# Patient Record
Sex: Female | Born: 1935 | Race: White | Hispanic: No | Marital: Married | Smoking: Former smoker
Health system: Southern US, Community
[De-identification: ages and names within clinical notes are randomized; demographics above are authoritative.]

## PROBLEM LIST (undated history)

## (undated) DIAGNOSIS — E785 Hyperlipidemia, unspecified: Secondary | ICD-10-CM

## (undated) DIAGNOSIS — I2510 Atherosclerotic heart disease of native coronary artery without angina pectoris: Secondary | ICD-10-CM

## (undated) DIAGNOSIS — E119 Type 2 diabetes mellitus without complications: Secondary | ICD-10-CM

## (undated) DIAGNOSIS — I890 Lymphedema, not elsewhere classified: Secondary | ICD-10-CM

## (undated) DIAGNOSIS — J449 Chronic obstructive pulmonary disease, unspecified: Secondary | ICD-10-CM

## (undated) DIAGNOSIS — I509 Heart failure, unspecified: Secondary | ICD-10-CM

## (undated) DIAGNOSIS — C9190 Lymphoid leukemia, unspecified not having achieved remission: Secondary | ICD-10-CM

## (undated) DIAGNOSIS — H409 Unspecified glaucoma: Secondary | ICD-10-CM

## (undated) DIAGNOSIS — I10 Essential (primary) hypertension: Secondary | ICD-10-CM

## (undated) DIAGNOSIS — N189 Chronic kidney disease, unspecified: Secondary | ICD-10-CM

## (undated) DIAGNOSIS — C9110 Chronic lymphocytic leukemia of B-cell type not having achieved remission: Secondary | ICD-10-CM

## (undated) HISTORY — DX: Hyperlipidemia, unspecified: E78.5

## (undated) HISTORY — DX: Chronic kidney disease, unspecified: N18.9

## (undated) HISTORY — PX: ABDOMINAL HYSTERECTOMY: SUR658

## (undated) HISTORY — DX: Chronic lymphocytic leukemia of B-cell type not having achieved remission: C91.10

## (undated) HISTORY — DX: Lymphedema, not elsewhere classified: I89.0

## (undated) HISTORY — PX: CHOLECYSTECTOMY: SHX55

## (undated) HISTORY — DX: Unspecified glaucoma: H40.9

## (undated) HISTORY — DX: Atherosclerotic heart disease of native coronary artery without angina pectoris: I25.10

## (undated) HISTORY — DX: Essential (primary) hypertension: I10

## (undated) HISTORY — PX: APPENDECTOMY: SHX54

---

## 2006-04-25 ENCOUNTER — Ambulatory Visit: Payer: Self-pay | Admitting: Gastroenterology

## 2006-04-29 ENCOUNTER — Ambulatory Visit: Payer: Self-pay | Admitting: Internal Medicine

## 2006-05-08 ENCOUNTER — Ambulatory Visit: Payer: Self-pay | Admitting: Internal Medicine

## 2006-09-22 ENCOUNTER — Ambulatory Visit: Payer: Self-pay | Admitting: Internal Medicine

## 2006-10-08 ENCOUNTER — Ambulatory Visit: Payer: Self-pay | Admitting: Internal Medicine

## 2007-01-13 ENCOUNTER — Ambulatory Visit: Payer: Self-pay | Admitting: Internal Medicine

## 2007-02-07 ENCOUNTER — Ambulatory Visit: Payer: Self-pay | Admitting: Internal Medicine

## 2007-05-11 ENCOUNTER — Ambulatory Visit: Payer: Self-pay | Admitting: Internal Medicine

## 2007-06-09 ENCOUNTER — Ambulatory Visit: Payer: Self-pay | Admitting: Internal Medicine

## 2007-08-11 ENCOUNTER — Ambulatory Visit: Payer: Self-pay | Admitting: Internal Medicine

## 2007-08-11 ENCOUNTER — Ambulatory Visit: Payer: Self-pay | Admitting: Family Medicine

## 2007-08-18 ENCOUNTER — Ambulatory Visit: Payer: Self-pay | Admitting: Family Medicine

## 2007-09-09 ENCOUNTER — Ambulatory Visit: Payer: Self-pay | Admitting: Internal Medicine

## 2007-11-09 ENCOUNTER — Ambulatory Visit: Payer: Self-pay | Admitting: Internal Medicine

## 2007-12-10 ENCOUNTER — Ambulatory Visit: Payer: Self-pay | Admitting: Internal Medicine

## 2008-01-02 ENCOUNTER — Ambulatory Visit: Payer: Self-pay | Admitting: Family Medicine

## 2008-01-04 ENCOUNTER — Ambulatory Visit: Payer: Self-pay | Admitting: Family Medicine

## 2008-01-07 ENCOUNTER — Ambulatory Visit: Payer: Self-pay | Admitting: Internal Medicine

## 2008-01-08 ENCOUNTER — Ambulatory Visit: Payer: Self-pay | Admitting: Internal Medicine

## 2008-02-07 ENCOUNTER — Ambulatory Visit: Payer: Self-pay | Admitting: Internal Medicine

## 2008-05-08 ENCOUNTER — Ambulatory Visit: Payer: Self-pay | Admitting: Internal Medicine

## 2008-06-08 ENCOUNTER — Ambulatory Visit: Payer: Self-pay | Admitting: Internal Medicine

## 2008-07-09 ENCOUNTER — Ambulatory Visit: Payer: Self-pay | Admitting: Internal Medicine

## 2009-01-06 ENCOUNTER — Ambulatory Visit: Payer: Self-pay | Admitting: Internal Medicine

## 2009-01-08 ENCOUNTER — Ambulatory Visit: Payer: Self-pay | Admitting: Family Medicine

## 2009-02-06 ENCOUNTER — Ambulatory Visit: Payer: Self-pay | Admitting: Internal Medicine

## 2009-07-07 ENCOUNTER — Ambulatory Visit: Payer: Self-pay | Admitting: Internal Medicine

## 2009-07-09 ENCOUNTER — Ambulatory Visit: Payer: Self-pay | Admitting: Internal Medicine

## 2010-01-06 ENCOUNTER — Ambulatory Visit: Payer: Self-pay | Admitting: Internal Medicine

## 2010-01-12 ENCOUNTER — Ambulatory Visit: Payer: Self-pay | Admitting: Family Medicine

## 2010-02-06 ENCOUNTER — Ambulatory Visit: Payer: Self-pay | Admitting: Internal Medicine

## 2011-01-04 ENCOUNTER — Ambulatory Visit: Payer: Self-pay | Admitting: Internal Medicine

## 2011-01-07 ENCOUNTER — Ambulatory Visit: Payer: Self-pay | Admitting: Internal Medicine

## 2011-01-25 ENCOUNTER — Ambulatory Visit: Payer: Self-pay | Admitting: Family Medicine

## 2011-02-22 ENCOUNTER — Ambulatory Visit: Payer: Self-pay | Admitting: Internal Medicine

## 2011-05-17 ENCOUNTER — Inpatient Hospital Stay: Payer: Self-pay | Admitting: Internal Medicine

## 2011-07-14 ENCOUNTER — Ambulatory Visit: Payer: Self-pay | Admitting: Internal Medicine

## 2011-08-09 ENCOUNTER — Ambulatory Visit: Payer: Self-pay | Admitting: Internal Medicine

## 2012-01-05 ENCOUNTER — Ambulatory Visit: Payer: Self-pay | Admitting: Internal Medicine

## 2012-01-05 LAB — CBC CANCER CENTER
Basophil %: 0.4 %
Eosinophil #: 0.5 x10 3/mm (ref 0.0–0.7)
Eosinophil %: 1.3 %
MCH: 30.3 pg (ref 26.0–34.0)
MCV: 91 fL (ref 80–100)
Monocyte #: 1.2 x10 3/mm — ABNORMAL HIGH (ref 0.0–0.7)
Neutrophil #: 8.4 x10 3/mm — ABNORMAL HIGH (ref 1.4–6.5)
Neutrophil %: 22.8 %
Platelet: 122 x10 3/mm — ABNORMAL LOW (ref 150–440)
RDW: 16.9 % — ABNORMAL HIGH (ref 11.5–14.5)

## 2012-01-05 LAB — CREATININE, SERUM: EGFR (African American): 33 — ABNORMAL LOW

## 2012-01-07 ENCOUNTER — Ambulatory Visit: Payer: Self-pay | Admitting: Internal Medicine

## 2012-02-02 ENCOUNTER — Ambulatory Visit: Payer: Self-pay | Admitting: Family Medicine

## 2012-07-04 ENCOUNTER — Ambulatory Visit: Payer: Self-pay | Admitting: Internal Medicine

## 2012-07-04 LAB — CBC CANCER CENTER
Basophil %: 0.4 %
HCT: 34.9 % — ABNORMAL LOW (ref 35.0–47.0)
HGB: 11.1 g/dL — ABNORMAL LOW (ref 12.0–16.0)
Lymphocyte %: 72.1 %
MCH: 29.3 pg (ref 26.0–34.0)
MCHC: 31.9 g/dL — ABNORMAL LOW (ref 32.0–36.0)
MCV: 92 fL (ref 80–100)
Neutrophil #: 5.4 x10 3/mm (ref 1.4–6.5)

## 2012-07-09 ENCOUNTER — Ambulatory Visit: Payer: Self-pay | Admitting: Internal Medicine

## 2012-07-19 DIAGNOSIS — G629 Polyneuropathy, unspecified: Secondary | ICD-10-CM | POA: Insufficient documentation

## 2012-11-14 ENCOUNTER — Ambulatory Visit: Payer: Self-pay | Admitting: Ophthalmology

## 2012-11-21 ENCOUNTER — Inpatient Hospital Stay: Payer: Self-pay | Admitting: Internal Medicine

## 2012-11-21 LAB — CBC
HCT: 34.8 % — ABNORMAL LOW (ref 35.0–47.0)
MCH: 28.5 pg (ref 26.0–34.0)
MCV: 91 fL (ref 80–100)

## 2012-11-21 LAB — URINALYSIS, COMPLETE
Bacteria: NONE SEEN
Bilirubin,UR: NEGATIVE
Granular Cast: 23
Ph: 5 (ref 4.5–8.0)
Protein: NEGATIVE
Squamous Epithelial: 3

## 2012-11-21 LAB — COMPREHENSIVE METABOLIC PANEL
Albumin: 3.3 g/dL — ABNORMAL LOW (ref 3.4–5.0)
Anion Gap: 8 (ref 7–16)
BUN: 47 mg/dL — ABNORMAL HIGH (ref 7–18)
Bilirubin,Total: 0.9 mg/dL (ref 0.2–1.0)
Co2: 22 mmol/L (ref 21–32)
EGFR (African American): 16 — ABNORMAL LOW
Osmolality: 285 (ref 275–301)
Sodium: 135 mmol/L — ABNORMAL LOW (ref 136–145)

## 2012-11-21 LAB — DIFFERENTIAL
Eosinophil %: 2.7 %
Lymphocyte #: 12.7 10*3/uL — ABNORMAL HIGH (ref 1.0–3.6)
Neutrophil %: 47.9 %

## 2012-11-21 LAB — PRO B NATRIURETIC PEPTIDE: B-Type Natriuretic Peptide: 3713 pg/mL — ABNORMAL HIGH (ref 0–450)

## 2012-11-22 LAB — BASIC METABOLIC PANEL
Anion Gap: 9 (ref 7–16)
BUN: 51 mg/dL — ABNORMAL HIGH (ref 7–18)
Co2: 23 mmol/L (ref 21–32)
Glucose: 124 mg/dL — ABNORMAL HIGH (ref 65–99)
Osmolality: 289 (ref 275–301)
Potassium: 4.5 mmol/L (ref 3.5–5.1)
Sodium: 137 mmol/L (ref 136–145)

## 2012-11-22 LAB — CBC WITH DIFFERENTIAL/PLATELET
Basophil #: 0 10*3/uL (ref 0.0–0.1)
Eosinophil #: 0.6 10*3/uL (ref 0.0–0.7)
HCT: 31.2 % — ABNORMAL LOW (ref 35.0–47.0)
Lymphocyte #: 10.1 10*3/uL — ABNORMAL HIGH (ref 1.0–3.6)
Lymphocyte %: 48 %
Monocyte #: 0.8 x10 3/mm (ref 0.2–0.9)
Monocyte %: 4 %
Neutrophil #: 9.4 10*3/uL — ABNORMAL HIGH (ref 1.4–6.5)
WBC: 21 10*3/uL — ABNORMAL HIGH (ref 3.6–11.0)

## 2012-11-23 LAB — BASIC METABOLIC PANEL
Creatinine: 2.53 mg/dL — ABNORMAL HIGH (ref 0.60–1.30)
EGFR (African American): 19 — ABNORMAL LOW
EGFR (Non-African Amer.): 17 — ABNORMAL LOW
Glucose: 110 mg/dL — ABNORMAL HIGH (ref 65–99)
Osmolality: 291 (ref 275–301)
Potassium: 4.3 mmol/L (ref 3.5–5.1)
Sodium: 137 mmol/L (ref 136–145)

## 2012-11-23 LAB — CBC WITH DIFFERENTIAL/PLATELET
Bands: 4 %
HCT: 28.8 % — ABNORMAL LOW (ref 35.0–47.0)
Lymphocytes: 60 %
MCH: 29.7 pg (ref 26.0–34.0)
MCHC: 33 g/dL (ref 32.0–36.0)
MCV: 90 fL (ref 80–100)
Variant Lymphocyte - H1-Rlymph: 2 %
WBC: 18.2 10*3/uL — ABNORMAL HIGH (ref 3.6–11.0)

## 2012-11-24 LAB — CBC WITH DIFFERENTIAL/PLATELET
Basophil #: 0 10*3/uL (ref 0.0–0.1)
HGB: 10.1 g/dL — ABNORMAL LOW (ref 12.0–16.0)
Lymphocyte %: 71.7 %
MCHC: 33.1 g/dL (ref 32.0–36.0)
MCV: 90 fL (ref 80–100)
Platelet: 98 10*3/uL — ABNORMAL LOW (ref 150–440)
WBC: 19.9 10*3/uL — ABNORMAL HIGH (ref 3.6–11.0)

## 2012-11-24 LAB — BASIC METABOLIC PANEL
BUN: 64 mg/dL — ABNORMAL HIGH (ref 7–18)
Co2: 26 mmol/L (ref 21–32)
Creatinine: 2.21 mg/dL — ABNORMAL HIGH (ref 0.60–1.30)
EGFR (Non-African Amer.): 20 — ABNORMAL LOW
Glucose: 114 mg/dL — ABNORMAL HIGH (ref 65–99)

## 2012-11-27 LAB — CULTURE, BLOOD (SINGLE)

## 2012-11-28 DIAGNOSIS — L02419 Cutaneous abscess of limb, unspecified: Secondary | ICD-10-CM

## 2012-11-28 DIAGNOSIS — I872 Venous insufficiency (chronic) (peripheral): Secondary | ICD-10-CM

## 2012-11-28 DIAGNOSIS — I10 Essential (primary) hypertension: Secondary | ICD-10-CM

## 2012-11-28 DIAGNOSIS — L03119 Cellulitis of unspecified part of limb: Secondary | ICD-10-CM

## 2012-11-28 DIAGNOSIS — I5032 Chronic diastolic (congestive) heart failure: Secondary | ICD-10-CM

## 2012-11-28 DIAGNOSIS — E1149 Type 2 diabetes mellitus with other diabetic neurological complication: Secondary | ICD-10-CM

## 2012-12-21 DIAGNOSIS — I10 Essential (primary) hypertension: Secondary | ICD-10-CM

## 2012-12-21 DIAGNOSIS — I872 Venous insufficiency (chronic) (peripheral): Secondary | ICD-10-CM

## 2012-12-21 DIAGNOSIS — I5032 Chronic diastolic (congestive) heart failure: Secondary | ICD-10-CM

## 2012-12-21 DIAGNOSIS — E1149 Type 2 diabetes mellitus with other diabetic neurological complication: Secondary | ICD-10-CM

## 2013-01-03 ENCOUNTER — Ambulatory Visit: Payer: Self-pay | Admitting: Internal Medicine

## 2013-01-05 LAB — CBC CANCER CENTER
Basophil #: 0.1 x10 3/mm (ref 0.0–0.1)
Lymphocyte #: 18.2 x10 3/mm — ABNORMAL HIGH (ref 1.0–3.6)
Lymphocyte %: 73.6 %
MCV: 91 fL (ref 80–100)
Monocyte %: 2.4 %
Neutrophil #: 5.5 x10 3/mm (ref 1.4–6.5)
Neutrophil %: 22.3 %
Platelet: 126 x10 3/mm — ABNORMAL LOW (ref 150–440)
RBC: 3.91 10*6/uL (ref 3.80–5.20)
RDW: 14.9 % — ABNORMAL HIGH (ref 11.5–14.5)
WBC: 24.7 x10 3/mm — ABNORMAL HIGH (ref 3.6–11.0)

## 2013-01-06 ENCOUNTER — Ambulatory Visit: Payer: Self-pay | Admitting: Internal Medicine

## 2013-02-06 ENCOUNTER — Ambulatory Visit: Payer: Self-pay | Admitting: Internal Medicine

## 2013-02-17 ENCOUNTER — Inpatient Hospital Stay: Payer: Self-pay | Admitting: Surgery

## 2013-02-17 LAB — CBC WITH DIFFERENTIAL/PLATELET
Eosinophil: 3 %
MCH: 28.6 pg (ref 26.0–34.0)
Platelet: 113 10*3/uL — ABNORMAL LOW (ref 150–440)
RBC: 4.18 10*6/uL (ref 3.80–5.20)
RDW: 15.6 % — ABNORMAL HIGH (ref 11.5–14.5)
Segmented Neutrophils: 47 %

## 2013-02-17 LAB — URINALYSIS, COMPLETE
Blood: NEGATIVE
Ketone: NEGATIVE
Nitrite: NEGATIVE
RBC,UR: 2 /HPF (ref 0–5)
Specific Gravity: 1.015 (ref 1.003–1.030)
Squamous Epithelial: NONE SEEN

## 2013-02-17 LAB — COMPREHENSIVE METABOLIC PANEL
Albumin: 3.6 g/dL (ref 3.4–5.0)
Alkaline Phosphatase: 67 U/L (ref 50–136)
BUN: 86 mg/dL — ABNORMAL HIGH (ref 7–18)
Chloride: 112 mmol/L — ABNORMAL HIGH (ref 98–107)
Co2: 21 mmol/L (ref 21–32)
Creatinine: 2.61 mg/dL — ABNORMAL HIGH (ref 0.60–1.30)
Glucose: 197 mg/dL — ABNORMAL HIGH (ref 65–99)
Potassium: 4.8 mmol/L (ref 3.5–5.1)
SGPT (ALT): 13 U/L (ref 12–78)
Sodium: 143 mmol/L (ref 136–145)

## 2013-02-18 LAB — CBC WITH DIFFERENTIAL/PLATELET
MCH: 28.6 pg (ref 26.0–34.0)
MCHC: 31 g/dL — ABNORMAL LOW (ref 32.0–36.0)
Other Cells Blood: 3
Platelet: 95 10*3/uL — ABNORMAL LOW (ref 150–440)
RBC: 4.47 10*6/uL (ref 3.80–5.20)
Segmented Neutrophils: 30 %
Variant Lymphocyte - H1-Rlymph: 5 %

## 2013-02-18 LAB — COMPREHENSIVE METABOLIC PANEL
Albumin: 3 g/dL — ABNORMAL LOW (ref 3.4–5.0)
Anion Gap: 6 — ABNORMAL LOW (ref 7–16)
Bilirubin,Total: 0.5 mg/dL (ref 0.2–1.0)
Calcium, Total: 8.7 mg/dL (ref 8.5–10.1)
Co2: 21 mmol/L (ref 21–32)
Creatinine: 1.95 mg/dL — ABNORMAL HIGH (ref 0.60–1.30)
EGFR (African American): 26 — ABNORMAL LOW
EGFR (Non-African Amer.): 23 — ABNORMAL LOW
Glucose: 144 mg/dL — ABNORMAL HIGH (ref 65–99)
Potassium: 4.8 mmol/L (ref 3.5–5.1)
SGOT(AST): 30 U/L (ref 15–37)
SGPT (ALT): 12 U/L (ref 12–78)

## 2013-02-19 LAB — CBC WITH DIFFERENTIAL/PLATELET
Basophil: 1 %
Eosinophil: 5 %
HCT: 30.2 % — ABNORMAL LOW (ref 35.0–47.0)
HGB: 9.7 g/dL — ABNORMAL LOW (ref 12.0–16.0)
Lymphocytes: 45 %
MCH: 29.2 pg (ref 26.0–34.0)
MCV: 91 fL (ref 80–100)
RBC: 3.32 10*6/uL — ABNORMAL LOW (ref 3.80–5.20)
RDW: 15.7 % — ABNORMAL HIGH (ref 11.5–14.5)
Segmented Neutrophils: 40 %

## 2013-02-19 LAB — CLOSTRIDIUM DIFFICILE BY PCR

## 2013-02-19 LAB — COMPREHENSIVE METABOLIC PANEL
Anion Gap: 6 — ABNORMAL LOW (ref 7–16)
Calcium, Total: 8.3 mg/dL — ABNORMAL LOW (ref 8.5–10.1)
EGFR (African American): 34 — ABNORMAL LOW
Glucose: 120 mg/dL — ABNORMAL HIGH (ref 65–99)
Osmolality: 304 (ref 275–301)
Potassium: 4.1 mmol/L (ref 3.5–5.1)
SGOT(AST): 17 U/L (ref 15–37)
SGPT (ALT): 9 U/L — ABNORMAL LOW (ref 12–78)

## 2013-02-20 LAB — BASIC METABOLIC PANEL
BUN: 39 mg/dL — ABNORMAL HIGH (ref 7–18)
Calcium, Total: 8.5 mg/dL (ref 8.5–10.1)
Chloride: 117 mmol/L — ABNORMAL HIGH (ref 98–107)
Co2: 21 mmol/L (ref 21–32)
Creatinine: 1.38 mg/dL — ABNORMAL HIGH (ref 0.60–1.30)
EGFR (African American): 40 — ABNORMAL LOW
Potassium: 4.3 mmol/L (ref 3.5–5.1)

## 2013-02-20 LAB — CBC WITH DIFFERENTIAL/PLATELET
Eosinophil: 2 %
HCT: 29.1 % — ABNORMAL LOW (ref 35.0–47.0)
MCH: 29.2 pg (ref 26.0–34.0)
MCV: 91 fL (ref 80–100)
Monocytes: 1 %
Platelet: 77 10*3/uL — ABNORMAL LOW (ref 150–440)
RBC: 3.22 10*6/uL — ABNORMAL LOW (ref 3.80–5.20)
WBC: 33.5 10*3/uL — ABNORMAL HIGH (ref 3.6–11.0)

## 2013-02-21 LAB — BASIC METABOLIC PANEL
Anion Gap: 6 — ABNORMAL LOW (ref 7–16)
BUN: 35 mg/dL — ABNORMAL HIGH (ref 7–18)
Calcium, Total: 8.6 mg/dL (ref 8.5–10.1)
Co2: 23 mmol/L (ref 21–32)
EGFR (Non-African Amer.): 32 — ABNORMAL LOW
Potassium: 4.2 mmol/L (ref 3.5–5.1)
Sodium: 144 mmol/L (ref 136–145)

## 2013-02-22 LAB — CBC WITH DIFFERENTIAL/PLATELET
Basophil %: 0.1 %
Eosinophil %: 0.3 %
HGB: 9.8 g/dL — ABNORMAL LOW (ref 12.0–16.0)
Lymphocyte #: 27.8 10*3/uL — ABNORMAL HIGH (ref 1.0–3.6)
Lymphocyte %: 66.4 %
MCH: 29 pg (ref 26.0–34.0)
MCHC: 32.1 g/dL (ref 32.0–36.0)
MCV: 91 fL (ref 80–100)
Monocyte #: 1.1 x10 3/mm — ABNORMAL HIGH (ref 0.2–0.9)
Monocyte %: 2.6 %
Neutrophil %: 30.6 %
Platelet: 104 10*3/uL — ABNORMAL LOW (ref 150–440)
RBC: 3.38 10*6/uL — ABNORMAL LOW (ref 3.80–5.20)
RDW: 15.3 % — ABNORMAL HIGH (ref 11.5–14.5)
WBC: 41.8 10*3/uL — ABNORMAL HIGH (ref 3.6–11.0)

## 2013-02-23 LAB — CBC WITH DIFFERENTIAL/PLATELET
Basophil #: 0.2 10*3/uL — ABNORMAL HIGH (ref 0.0–0.1)
Eosinophil #: 0.4 10*3/uL (ref 0.0–0.7)
HCT: 31.1 % — ABNORMAL LOW (ref 35.0–47.0)
Lymphocyte %: 69.8 %
MCH: 29.1 pg (ref 26.0–34.0)
MCHC: 32 g/dL (ref 32.0–36.0)
MCV: 91 fL (ref 80–100)
Monocyte #: 1.1 x10 3/mm — ABNORMAL HIGH (ref 0.2–0.9)
Monocyte %: 2.5 %
Neutrophil #: 11.9 10*3/uL — ABNORMAL HIGH (ref 1.4–6.5)
Neutrophil %: 26.2 %
RBC: 3.43 10*6/uL — ABNORMAL LOW (ref 3.80–5.20)
RDW: 15.9 % — ABNORMAL HIGH (ref 11.5–14.5)
WBC: 45.4 10*3/uL — ABNORMAL HIGH (ref 3.6–11.0)

## 2013-02-24 LAB — BASIC METABOLIC PANEL
Calcium, Total: 8.5 mg/dL (ref 8.5–10.1)
Co2: 25 mmol/L (ref 21–32)
Creatinine: 1.11 mg/dL (ref 0.60–1.30)
EGFR (African American): 52 — ABNORMAL LOW
Sodium: 141 mmol/L (ref 136–145)

## 2013-02-24 LAB — CBC WITH DIFFERENTIAL/PLATELET
Eosinophil: 1 %
Lymphocytes: 76 %
MCH: 28.7 pg (ref 26.0–34.0)
MCHC: 32 g/dL (ref 32.0–36.0)
Platelet: 134 10*3/uL — ABNORMAL LOW (ref 150–440)
RDW: 15.8 % — ABNORMAL HIGH (ref 11.5–14.5)
Segmented Neutrophils: 14 %
Variant Lymphocyte - H1-Rlymph: 5 %
WBC: 38.3 10*3/uL — ABNORMAL HIGH (ref 3.6–11.0)

## 2013-02-26 LAB — TPN PANEL
Albumin: 2.5 g/dL — ABNORMAL LOW (ref 3.4–5.0)
Anion Gap: 5 — ABNORMAL LOW (ref 7–16)
Calcium, Total: 8.2 mg/dL — ABNORMAL LOW (ref 8.5–10.1)
Cholesterol: 106 mg/dL (ref 0–200)
Co2: 25 mmol/L (ref 21–32)
Creatinine: 0.93 mg/dL (ref 0.60–1.30)
Glucose: 159 mg/dL — ABNORMAL HIGH (ref 65–99)
INR: 1.2
Osmolality: 282 (ref 275–301)
Platelet: 205 10*3/uL (ref 150–440)
Potassium: 4.3 mmol/L (ref 3.5–5.1)
Prothrombin Time: 15.2 secs — ABNORMAL HIGH (ref 11.5–14.7)
SGOT(AST): 16 U/L (ref 15–37)
Sodium: 139 mmol/L (ref 136–145)
Total Protein: 5.2 g/dL — ABNORMAL LOW (ref 6.4–8.2)

## 2013-02-27 LAB — BASIC METABOLIC PANEL
Anion Gap: 9 (ref 7–16)
BUN: 8 mg/dL (ref 7–18)
Co2: 21 mmol/L (ref 21–32)
EGFR (African American): >60
EGFR (Non-African Amer.): >60
Glucose: 153 mg/dL — ABNORMAL HIGH (ref 65–99)
Osmolality: 277 (ref 275–301)
Potassium: 5.8 mmol/L — ABNORMAL HIGH (ref 3.5–5.1)
Sodium: 138 mmol/L (ref 136–145)

## 2013-02-27 LAB — PHOSPHORUS: Phosphorus: 2.7 mg/dL (ref 2.5–4.9)

## 2013-02-27 LAB — PATHOLOGY REPORT

## 2013-02-27 LAB — POTASSIUM: Potassium: 4.1 mmol/L (ref 3.5–5.1)

## 2013-02-28 LAB — CBC WITH DIFFERENTIAL/PLATELET
Basophil #: 0.2 10*3/uL — ABNORMAL HIGH (ref 0.0–0.1)
Basophil %: 0.4 %
Eosinophil %: 0.4 %
HCT: 29.9 % — ABNORMAL LOW (ref 35.0–47.0)
HGB: 9.2 g/dL — ABNORMAL LOW (ref 12.0–16.0)
Lymphocyte #: 32 10*3/uL — ABNORMAL HIGH (ref 1.0–3.6)
Lymphocyte %: 63.8 %
MCH: 27.6 pg (ref 26.0–34.0)
MCV: 90 fL (ref 80–100)
Monocyte #: 1.1 x10 3/mm — ABNORMAL HIGH (ref 0.2–0.9)
Monocyte %: 2.3 %
Neutrophil #: 16.6 10*3/uL — ABNORMAL HIGH (ref 1.4–6.5)
RDW: 15.6 % — ABNORMAL HIGH (ref 11.5–14.5)

## 2013-02-28 LAB — BASIC METABOLIC PANEL
Anion Gap: 4 — ABNORMAL LOW (ref 7–16)
BUN: 8 mg/dL (ref 7–18)
Calcium, Total: 7.7 mg/dL — ABNORMAL LOW (ref 8.5–10.1)
Chloride: 103 mmol/L (ref 98–107)
Co2: 29 mmol/L (ref 21–32)
EGFR (African American): >60
EGFR (Non-African Amer.): 55 — ABNORMAL LOW
Glucose: 219 mg/dL — ABNORMAL HIGH (ref 65–99)
Sodium: 136 mmol/L (ref 136–145)

## 2013-02-28 LAB — PHOSPHORUS: Phosphorus: 2.4 mg/dL — ABNORMAL LOW (ref 2.5–4.9)

## 2013-02-28 LAB — MAGNESIUM: Magnesium: 1.8 mg/dL

## 2013-03-01 ENCOUNTER — Ambulatory Visit (HOSPITAL_COMMUNITY)
Admission: AD | Admit: 2013-03-01 | Discharge: 2013-03-01 | Disposition: A | Discharge status: Home / Self Care | Payer: Medicare Other | Source: Other Acute Inpatient Hospital | Attending: Internal Medicine | Admitting: Internal Medicine

## 2013-03-01 ENCOUNTER — Inpatient Hospital Stay
Admission: AD | Admit: 2013-03-01 | Discharge: 2013-04-06 | Disposition: A | Discharge status: Skilled Nursing Facility | Payer: Medicare Other | Source: Ambulatory Visit | Attending: Internal Medicine | Admitting: Internal Medicine

## 2013-03-01 DIAGNOSIS — A0472 Enterocolitis due to Clostridium difficile, not specified as recurrent: Secondary | ICD-10-CM | POA: Diagnosis present

## 2013-03-01 DIAGNOSIS — E118 Type 2 diabetes mellitus with unspecified complications: Secondary | ICD-10-CM | POA: Diagnosis present

## 2013-03-01 DIAGNOSIS — J449 Chronic obstructive pulmonary disease, unspecified: Secondary | ICD-10-CM | POA: Diagnosis present

## 2013-03-01 DIAGNOSIS — I214 Non-ST elevation (NSTEMI) myocardial infarction: Secondary | ICD-10-CM | POA: Diagnosis not present

## 2013-03-01 DIAGNOSIS — K259 Gastric ulcer, unspecified as acute or chronic, without hemorrhage or perforation: Secondary | ICD-10-CM

## 2013-03-01 DIAGNOSIS — I509 Heart failure, unspecified: Secondary | ICD-10-CM | POA: Diagnosis present

## 2013-03-01 HISTORY — DX: Heart failure, unspecified: I50.9

## 2013-03-01 HISTORY — DX: Lymphoid leukemia, unspecified not having achieved remission: C91.90

## 2013-03-01 HISTORY — DX: Chronic obstructive pulmonary disease, unspecified: J44.9

## 2013-03-01 HISTORY — DX: Type 2 diabetes mellitus without complications: E11.9

## 2013-03-01 LAB — BASIC METABOLIC PANEL
Anion Gap: 5 — ABNORMAL LOW (ref 7–16)
BUN: 12 mg/dL (ref 7–18)
EGFR (African American): 53 — ABNORMAL LOW
Osmolality: 283 (ref 275–301)

## 2013-03-01 LAB — MAGNESIUM: Magnesium: 1.6 mg/dL — ABNORMAL LOW

## 2013-03-02 ENCOUNTER — Other Ambulatory Visit (HOSPITAL_COMMUNITY): Payer: Medicare Other

## 2013-03-02 LAB — COMPREHENSIVE METABOLIC PANEL
AST: 15 U/L (ref 0–37)
Albumin: 2.5 g/dL — ABNORMAL LOW (ref 3.5–5.2)
Alkaline Phosphatase: 50 U/L (ref 39–117)
Chloride: 96 mEq/L (ref 96–112)
Potassium: 3.2 mEq/L — ABNORMAL LOW (ref 3.5–5.1)
Total Bilirubin: 0.4 mg/dL (ref 0.3–1.2)
Total Protein: 5.4 g/dL — ABNORMAL LOW (ref 6.0–8.3)

## 2013-03-02 LAB — CBC
MCHC: 32.8 g/dL (ref 30.0–36.0)
Platelets: 243 10*3/uL (ref 150–400)
RDW: 16 % — ABNORMAL HIGH (ref 11.5–15.5)
WBC: 37.3 10*3/uL — ABNORMAL HIGH (ref 4.0–10.5)

## 2013-03-02 LAB — PREALBUMIN: Prealbumin: 11.8 mg/dL — ABNORMAL LOW (ref 17.0–34.0)

## 2013-03-03 ENCOUNTER — Other Ambulatory Visit (HOSPITAL_COMMUNITY): Payer: Medicare Other

## 2013-03-03 LAB — BASIC METABOLIC PANEL
BUN: 18 mg/dL (ref 6–23)
Chloride: 98 mEq/L (ref 96–112)
GFR calc Af Amer: 55 mL/min — ABNORMAL LOW (ref >90)
GFR calc non Af Amer: 48 mL/min — ABNORMAL LOW (ref >90)
Glucose, Bld: 356 mg/dL — ABNORMAL HIGH (ref 70–99)
Potassium: 4.2 mEq/L (ref 3.5–5.1)
Sodium: 136 mEq/L (ref 135–145)

## 2013-03-03 LAB — CK TOTAL AND CKMB (NOT AT ARMC)
CK, MB: 3.6 ng/mL (ref 0.3–4.0)
CK, MB: 3.7 ng/mL (ref 0.3–4.0)
Relative Index: INVALID (ref 0.0–2.5)
Total CK: 34 U/L (ref 7–177)
Total CK: 36 U/L (ref 7–177)

## 2013-03-03 LAB — TROPONIN I: Troponin I: 0.72 ng/mL (ref <0.30)

## 2013-03-03 LAB — MAGNESIUM: Magnesium: 2 mg/dL (ref 1.5–2.5)

## 2013-03-04 DIAGNOSIS — I509 Heart failure, unspecified: Secondary | ICD-10-CM

## 2013-03-04 LAB — CK TOTAL AND CKMB (NOT AT ARMC)
CK, MB: 3.6 ng/mL (ref 0.3–4.0)
CK, MB: 3.6 ng/mL (ref 0.3–4.0)
CK, MB: 3.3 ng/mL (ref 0.3–4.0)
CK, MB: 2.9 ng/mL (ref 0.3–4.0)
CK, MB: 2.5 ng/mL (ref 0.3–4.0)
Relative Index: INVALID (ref 0.0–2.5)
Relative Index: INVALID (ref 0.0–2.5)
Total CK: 39 U/L (ref 7–177)
Total CK: 40 U/L (ref 7–177)
Total CK: 30 U/L (ref 7–177)

## 2013-03-04 LAB — BASIC METABOLIC PANEL
BUN: 20 mg/dL (ref 6–23)
Creatinine, Ser: 0.92 mg/dL (ref 0.50–1.10)
GFR calc Af Amer: 64 mL/min — ABNORMAL LOW (ref >90)
GFR calc non Af Amer: 55 mL/min — ABNORMAL LOW (ref >90)

## 2013-03-04 LAB — TROPONIN I: Troponin I: 0.46 ng/mL (ref <0.30)

## 2013-03-04 NOTE — Progress Notes (Signed)
  Echocardiogram 2D Echocardiogram has been performed.  Chelsea [name] 03/04/2013, 2:17 PM

## 2013-03-05 ENCOUNTER — Other Ambulatory Visit (HOSPITAL_COMMUNITY): Payer: Medicare Other

## 2013-03-05 ENCOUNTER — Encounter: Payer: Self-pay | Admitting: Physician Assistant

## 2013-03-05 DIAGNOSIS — I214 Non-ST elevation (NSTEMI) myocardial infarction: Secondary | ICD-10-CM

## 2013-03-05 DIAGNOSIS — A0472 Enterocolitis due to Clostridium difficile, not specified as recurrent: Secondary | ICD-10-CM | POA: Diagnosis present

## 2013-03-05 DIAGNOSIS — E118 Type 2 diabetes mellitus with unspecified complications: Secondary | ICD-10-CM | POA: Diagnosis present

## 2013-03-05 DIAGNOSIS — I509 Heart failure, unspecified: Secondary | ICD-10-CM

## 2013-03-05 DIAGNOSIS — J449 Chronic obstructive pulmonary disease, unspecified: Secondary | ICD-10-CM | POA: Diagnosis present

## 2013-03-05 LAB — COMPREHENSIVE METABOLIC PANEL
ALT: 12 U/L (ref 0–35)
Alkaline Phosphatase: 56 U/L (ref 39–117)
CO2: 36 mEq/L — ABNORMAL HIGH (ref 19–32)
GFR calc Af Amer: 47 mL/min — ABNORMAL LOW (ref >90)
Glucose, Bld: 268 mg/dL — ABNORMAL HIGH (ref 70–99)
Potassium: 3.4 mEq/L — ABNORMAL LOW (ref 3.5–5.1)
Sodium: 137 mEq/L (ref 135–145)
Total Protein: 5.4 g/dL — ABNORMAL LOW (ref 6.0–8.3)

## 2013-03-05 LAB — CBC
HCT: 28.2 % — ABNORMAL LOW (ref 36.0–46.0)
MCH: 29.4 pg (ref 26.0–34.0)
MCV: 87.3 fL (ref 78.0–100.0)
Platelets: 241 10*3/uL (ref 150–400)
RBC: 3.23 MIL/uL — ABNORMAL LOW (ref 3.87–5.11)

## 2013-03-05 LAB — TROPONIN I
Troponin I: 0.4 ng/mL (ref <0.30)
Troponin I: <0.3 ng/mL (ref <0.30)
Troponin I: 0.49 ng/mL (ref <0.30)

## 2013-03-05 LAB — DIFFERENTIAL
Basophils Relative: 0 % (ref 0–1)
Eosinophils Relative: 1 % (ref 0–5)
Lymphs Abs: 20.2 10*3/uL — ABNORMAL HIGH (ref 0.7–4.0)
Monocytes Absolute: 0.9 10*3/uL (ref 0.1–1.0)
Neutro Abs: 9.2 10*3/uL — ABNORMAL HIGH (ref 1.7–7.7)

## 2013-03-05 LAB — CHOLESTEROL, TOTAL: Cholesterol: 114 mg/dL (ref 0–200)

## 2013-03-05 LAB — CK TOTAL AND CKMB (NOT AT ARMC)
Relative Index: INVALID (ref 0.0–2.5)
Relative Index: INVALID (ref 0.0–2.5)
Total CK: 32 U/L (ref 7–177)

## 2013-03-05 NOTE — Consult Note (Signed)
CARDIOLOGY CONSULT NOTE   Patient ID: Chelsea [name] MRN: [id number] DOB/AGE: [id number] 77 y.o.  Admit date: 03/01/2013  Primary Physician   Tillman Abide, MD Primary Cardiologist   Dr. Marcina Millard, MD [address] [place], [place], [place] [address] [phone]  Reason for Consultation  - possible acute MI  HPI:Chelsea [name] is a 79 y.o. female with a history of CHF.  She was in the [facility] for 12 days with gastric ischemia and C. difficile colitis. She was transferred to [facility] last Thursday for TPN and strengthening. Her BNP has been very high and she has a history of heart failure. Cardiac enzymes were cycled and were elevated. Cardiology was asked to evaluate her.  Chelsea [name] is followed by Dr. Darrold Junker in [place]. She has a history of heart failure. [facility], she has had some problems with shortness of breath. She feels her lower remedy edema has improved recently. She is on home oxygen at 3 L per minute chronically. She feels her respiratory status is about the same as it has been over the last few days but admits to dyspnea with any sort of exertion. Her respiratory status is not at her baseline. She is also extremely weak. She has not had palpitations or chest pain. She has orthopnea and PND. She had left lower extremity cellulitis and was in [employer]. The cellulitis has improved and the boots have been removed. There is still a small left lower extremity wound which is still being treated. She denies coughing but has had wheezing at times.  She is extremely weak and cannot move herself around very well. She has not had chest pain with any exertion. She has SOB, but is being diuresed and this is improving slowly.   Past Medical History  Diagnosis Date  . CHF (congestive heart failure)   . COPD (chronic obstructive pulmonary disease)   . Diabetes mellitus   . Lymphocytic leukemia    Past Surgical History  Procedure  Laterality Date  . Abdominal hysterectomy    . Appendectomy      Allergies  Allergen Reactions  . Allopurinol   . Darvocet (Propoxyphene-Acetaminophen)   . Penicillins   . Prednisone    I have reviewed the patient's current medications on the med list in her chart. ASA 81, enalapril 5, Lasix 60 mg IV TID, duoneb, Lantus, Lovenox 100 bid, protonix, Flora-q, carafate, vancomycin oral, PRN meds   History   Social History  . Marital Status: Married    Spouse Name: N/A    Number of Children: N/A  . Years of Education: N/A   Occupational History  . Retired Comptroller at [employer] History Main Topics  . Smoking status: Former Smoker -- 30 years    Quit date: 11/08/1970  . Smokeless tobacco: Never Used  . Alcohol Use: No  . Drug Use: No  . Sexually Active: Not on file   Other Topics Concern  . Not on file   Social History Narrative   Lives with husband at [facility] independent living.   Family Status  Relation Status Death Age  . Mother Deceased     No CAD  . Father Deceased     No CAD   ROS:  Full 14 point review of systems complete and found to be negative unless listed above.  Physical Exam: VS: 97.5, 118/68, 99, 18, 96% on O2. General: Well developed, well nourished, female in mild distress  Head: Eyes PERRLA, No xanthomas.   Normocephalic and atraumatic, oropharynx without edema or exudate. Dentition: Poor Lungs: Decreased breath sounds bilateral bases  Heart: HRRR S1 S2, no rub/gallop, 2/6 murmur. pulses are 2+ upper extrem, decreased in lower extremities  Neck: No carotid bruits. No lymphadenopathy.  JVD approximately 10 cm. Abdomen: Bowel sounds present, abdomen soft and non-tender without masses or hernias noted. Msk:  No spine or cva tenderness. No weakness, no joint deformities or effusions. Extremities: No clubbing or cyanosis. Chelsea edema.  Neuro: Alert and oriented X 3. No focal deficits noted. Psych:  Good affect, responds  appropriately Skin: No rashes noted. Chronic stasis changes noted. Left lower extremity wound bandaged and dressed and not disturbed.  Labs:   Lab Results  Component Value Date   WBC 30.6* 03/05/2013   HGB 9.5* 03/05/2013   HCT 28.2* 03/05/2013   MCV 87.3 03/05/2013   PLT 241 03/05/2013  CL 95*  CO2 36*  BUN 23  CREATININE 1.17*  CALCIUM 8.8  PROT 5.4*  BILITOT 0.5  ALKPHOS 56  ALT 12  AST 14  GLUCOSE 268*   Magnesium  Date Value Range Status  03/05/2013 1.9  1.5 - 2.5 mg/dL Final   @LABLAST2 (cktotal:4,ckmb:4,troponini:4)@  Recent Labs  Recent Labs  03/04/13 0139 03/04/13 0743 03/04/13 1137 03/04/13 1556 03/04/13 2041 03/05/13 0155 03/05/13 0703 03/05/13 0950  TROPONINI 0.59* 0.73* 0.46* 0.47* 0.41* 0.50* 0.40* <0.30    03/04/13 1556 03/04/13 2041 03/05/13 0155 03/05/13 0703  CKTOTAL 40 30 25 34  CKMB 2.9 2.5 2.5 2.8   Pro B Natriuretic peptide (BNP)  Date/Time Value Range Status  03/05/2013  7:03 AM 18986.0* 0 - 450 pg/mL Final  03/03/2013  4:48 PM 20332.0* 0 - 450 pg/mL Final   Lab Results  Component Value Date   CHOL 114 03/05/2013   TRIG 84 03/05/2013   TSH  Date/Time Value Range Status  03/02/2013  6:10 AM 5.539* 0.350 - 4.500 uIU/mL Final   ECG:  04-Mar-2013 12:20:07  Normal sinus rhythm Low voltage QRS Nonspecific ST and T wave abnormality Abnormal ECG 37mm/s 32mm/mV 100Hz  8.0.1 12SL 241 HD CID: 1 Referred by: Unconfirmed Vent. rate 96 BPM PR interval 118 ms QRS duration 76 ms QT/QTc 338/427 ms P-R-T axes 52 -4 84  ECHO:  03/04/2013 Conclusions - Left ventricle: Technically difficult study. There is some hypokinesis of the inferior wall and the inferolateral wall. The cavity size was normal. Wall thickness was normal. Systolic function was mildly reduced. The estimated ejection fraction was in the range of 45% to 50%. - Aortic valve: Sclerosis without stenosis. - Inferior vena cava: The vessel was dilated; the respirophasic diameter  changes were blunted (< 50%); findings are consistent with elevated central venous pressure.  Radiology:  Dg Chest Port 1 View 03/05/2013  *RADIOLOGY REPORT*  Clinical Data: Congestive heart failure  PORTABLE CHEST - 1 VIEW  Comparison: 03/03/2013; 03/02/2013  Findings:  Grossly unchanged cardiac silhouette and mediastinal contours with atherosclerotic calcifications within the thoracic aorta.  Stable positioning of support apparatus.  Pulmonary vascular remains indistinct with cephalization of flow.  Grossly unchanged small, possibly partially loculated bilateral pleural effusions with associated bilateral mid and lower lung heterogeneous / consolidative opacities.  No new focal airspace opacities.  No pneumothorax.  Unchanged bones.  IMPRESSION: Grossly unchanged findings of pulmonary edema, small bilateral effusions and bibasilar opacities, atelectasis versus infiltrate.   Original Report Authenticated By: Tacey Ruiz, MD    Dg Chest Port 1 View 03/03/2013  *  RADIOLOGY REPORT*  Clinical Data: Shortness of breath.  PORTABLE CHEST - 1 VIEW  Comparison: 03/02/2013  Findings: Persistent obscuration of the lung bases by airspace opacities noted.  A component of this may be due to pleural effusions and passive atelectasis.  Right PICC line tip:  SVC.  Borderline cardiomegaly.  Indistinct pulmonary vasculature.  IMPRESSION:  1.  Stable bibasilar airspace opacities with obscuration of the hemidiaphragms, potentially from atelectasis or pneumonia with associated pleural effusions. 2.  Borderline cardiomegaly, with indistinct pulmonary vasculature which may reflect pulmonary venous hypertension.   Original Report Authenticated By: Gaylyn Rong, M.D.     ASSESSMENT AND PLAN:   The patient was seen today by Dr Elease Hashimoto, the patient evaluated and the data reviewed.     Non-ST elevation myocardial infarction (NSTEMI), initial care episode - Pt has had no ischemic symptoms and her BNP is very high. She has  physiological stressors from her multiple medical problems. It is not clear she has had an acute vessel closure. Her ECG is not acute and her EF is mildly decreased. She has WMA on echo but is without ischemic symptoms. In the setting of pt needing treatment of her multiple medical problems, would hold off on further evaluation. As she gets stronger, consideration can be given to cardiac cath, but pt may want to defer this decision to Dr Darrold Junker.    CHF (congestive heart failure) - chronic, not clear how much is acute. Pt is being diuresed. Agree with this, will leave to primary MD. Would follow I/O and daily weights, monitor electrolytes and renal function closely.   Otherwise, per primary MD Active Problems:   Diabetes mellitus   COPD (chronic obstructive pulmonary disease   C. difficile colitis   Ischemic gastritis      Signed: Theodore Demark, PA-C 03/05/2013 9:55 AM Alvino Chapel [phone]  Co-Sign MD   Attending Note:   The patient was seen and examined.  Agree with assessment and plan as noted above.  Changes made to the above note as needed.  Mrs. Insco is seen today for minimal elevations on her lab work. She has very mild elevations of her troponin levels have now normalized. She's not had any episodes of chest pain. She does have a long history of congestive heart failure that has been well treated.  She's not in any acute distress.  Her exam is noted above.  At this point I would not recommend any further cardiac workup. She needs to get stronger so that she can get her GI workup completed. She's not had any symptoms of angina-I think that her troponin levels were adjusted due to her congestive heart failure and not an acute plaque rupture.  We will continue to assess her and can consider more aggressive approach if she develops any symptoms.   Her echocardiogram reveals well preserved left ventricle systolic function. She does have some hypokinesis of the inferior and  inferior lateral walls.  Continue with her same medications.    Vesta Mixer, Montez Hageman., MD, [facility] 03/05/2013, 1:24 PM

## 2013-03-06 LAB — BASIC METABOLIC PANEL
BUN: 33 mg/dL — ABNORMAL HIGH (ref 6–23)
GFR calc non Af Amer: 31 mL/min — ABNORMAL LOW (ref >90)
Glucose, Bld: 290 mg/dL — ABNORMAL HIGH (ref 70–99)
Potassium: 3.3 mEq/L — ABNORMAL LOW (ref 3.5–5.1)

## 2013-03-06 NOTE — Progress Notes (Signed)
Patient ID: Chelsea [name], female   DOB: March 05, 1925, 79 y.o.   MRN: [id number]   I printed a copy of the consult note done by Dr. Elease Hashimoto March 05, 2013. I placed this printed copy in the [facility] chart. The patient is stable today.    Jerral Bonito, MD

## 2013-03-08 ENCOUNTER — Ambulatory Visit: Payer: Self-pay | Admitting: Internal Medicine

## 2013-03-08 LAB — COMPREHENSIVE METABOLIC PANEL
Albumin: 2.6 g/dL — ABNORMAL LOW (ref 3.5–5.2)
BUN: 60 mg/dL — ABNORMAL HIGH (ref 6–23)
Chloride: 94 mEq/L — ABNORMAL LOW (ref 96–112)
Creatinine, Ser: 1.93 mg/dL — ABNORMAL HIGH (ref 0.50–1.10)
GFR calc Af Amer: 26 mL/min — ABNORMAL LOW (ref >90)
GFR calc non Af Amer: 22 mL/min — ABNORMAL LOW (ref >90)
Glucose, Bld: 296 mg/dL — ABNORMAL HIGH (ref 70–99)
Total Bilirubin: 0.3 mg/dL (ref 0.3–1.2)

## 2013-03-08 LAB — MAGNESIUM: Magnesium: 2.3 mg/dL (ref 1.5–2.5)

## 2013-03-08 LAB — PHOSPHORUS: Phosphorus: 4.7 mg/dL — ABNORMAL HIGH (ref 2.3–4.6)

## 2013-03-09 ENCOUNTER — Other Ambulatory Visit (HOSPITAL_COMMUNITY): Payer: Medicare Other

## 2013-03-09 LAB — CBC WITH DIFFERENTIAL/PLATELET
Basophils Absolute: 0 10*3/uL (ref 0.0–0.1)
Eosinophils Absolute: 0.3 10*3/uL (ref 0.0–0.7)
Hemoglobin: 9.6 g/dL — ABNORMAL LOW (ref 12.0–15.0)
Lymphocytes Relative: 72 % — ABNORMAL HIGH (ref 12–46)
MCHC: 32.1 g/dL (ref 30.0–36.0)
Neutrophils Relative %: 24 % — ABNORMAL LOW (ref 43–77)
Platelets: 244 10*3/uL (ref 150–400)
RDW: 16.5 % — ABNORMAL HIGH (ref 11.5–15.5)

## 2013-03-09 LAB — BASIC METABOLIC PANEL
Calcium: 9.4 mg/dL (ref 8.4–10.5)
GFR calc Af Amer: 32 mL/min — ABNORMAL LOW (ref >90)
GFR calc non Af Amer: 27 mL/min — ABNORMAL LOW (ref >90)
Potassium: 3.9 mEq/L (ref 3.5–5.1)
Sodium: 135 mEq/L (ref 135–145)

## 2013-03-12 LAB — CBC
HCT: 29.9 % — ABNORMAL LOW (ref 36.0–46.0)
Hemoglobin: 9.6 g/dL — ABNORMAL LOW (ref 12.0–15.0)
MCH: 28.6 pg (ref 26.0–34.0)
MCHC: 32.1 g/dL (ref 30.0–36.0)
MCV: 89 fL (ref 78.0–100.0)
Platelets: 223 10*3/uL (ref 150–400)
RBC: 3.36 MIL/uL — ABNORMAL LOW (ref 3.87–5.11)
RDW: 16.5 % — ABNORMAL HIGH (ref 11.5–15.5)
WBC: 31.2 10*3/uL — ABNORMAL HIGH (ref 4.0–10.5)

## 2013-03-12 LAB — COMPREHENSIVE METABOLIC PANEL
ALT: 51 U/L — ABNORMAL HIGH (ref 0–35)
Albumin: 2.8 g/dL — ABNORMAL LOW (ref 3.5–5.2)
Alkaline Phosphatase: 116 U/L (ref 39–117)
BUN: 90 mg/dL — ABNORMAL HIGH (ref 6–23)
Calcium: 9.7 mg/dL (ref 8.4–10.5)
Potassium: 4.4 mEq/L (ref 3.5–5.1)
Sodium: 131 mEq/L — ABNORMAL LOW (ref 135–145)
Total Protein: 6.3 g/dL (ref 6.0–8.3)

## 2013-03-12 LAB — DIFFERENTIAL
Basophils Relative: 0 % (ref 0–1)
Eosinophils Relative: 1 % (ref 0–5)
Monocytes Absolute: 1.2 10*3/uL — ABNORMAL HIGH (ref 0.1–1.0)
Monocytes Relative: 4 % (ref 3–12)
Neutrophils Relative %: 26 % — ABNORMAL LOW (ref 43–77)

## 2013-03-12 LAB — CHOLESTEROL, TOTAL: Cholesterol: 149 mg/dL (ref 0–200)

## 2013-03-12 LAB — PREALBUMIN: Prealbumin: 23 mg/dL (ref 17.0–34.0)

## 2013-03-12 LAB — MAGNESIUM: Magnesium: 2.5 mg/dL (ref 1.5–2.5)

## 2013-03-13 LAB — BASIC METABOLIC PANEL
BUN: 93 mg/dL — ABNORMAL HIGH (ref 6–23)
Calcium: 9.7 mg/dL (ref 8.4–10.5)
GFR calc non Af Amer: 24 mL/min — ABNORMAL LOW (ref >90)
Glucose, Bld: 241 mg/dL — ABNORMAL HIGH (ref 70–99)

## 2013-03-15 LAB — COMPREHENSIVE METABOLIC PANEL WITH GFR
ALT: 23 U/L (ref 0–35)
AST: 24 U/L (ref 0–37)
Albumin: 2.7 g/dL — ABNORMAL LOW (ref 3.5–5.2)
Alkaline Phosphatase: 97 U/L (ref 39–117)
BUN: 99 mg/dL — ABNORMAL HIGH (ref 6–23)
CO2: 25 meq/L (ref 19–32)
Calcium: 9.5 mg/dL (ref 8.4–10.5)
Chloride: 90 meq/L — ABNORMAL LOW (ref 96–112)
Creatinine, Ser: 1.99 mg/dL — ABNORMAL HIGH (ref 0.50–1.10)
GFR calc Af Amer: 25 mL/min — ABNORMAL LOW
GFR calc non Af Amer: 22 mL/min — ABNORMAL LOW
Glucose, Bld: 248 mg/dL — ABNORMAL HIGH (ref 70–99)
Potassium: 5.5 meq/L — ABNORMAL HIGH (ref 3.5–5.1)
Sodium: 127 meq/L — ABNORMAL LOW (ref 135–145)
Total Bilirubin: 0.3 mg/dL (ref 0.3–1.2)
Total Protein: 6 g/dL (ref 6.0–8.3)

## 2013-03-15 LAB — CBC
Hemoglobin: 9.2 g/dL — ABNORMAL LOW (ref 12.0–15.0)
MCHC: 33.2 g/dL (ref 30.0–36.0)
Platelets: 200 10*3/uL (ref 150–400)
RBC: 3.18 MIL/uL — ABNORMAL LOW (ref 3.87–5.11)

## 2013-03-15 LAB — MAGNESIUM: Magnesium: 2.9 mg/dL — ABNORMAL HIGH (ref 1.5–2.5)

## 2013-03-15 LAB — PHOSPHORUS: Phosphorus: 4.8 mg/dL — ABNORMAL HIGH (ref 2.3–4.6)

## 2013-03-16 LAB — BASIC METABOLIC PANEL
GFR calc Af Amer: 26 mL/min — ABNORMAL LOW (ref >90)
GFR calc non Af Amer: 23 mL/min — ABNORMAL LOW (ref >90)
Glucose, Bld: 192 mg/dL — ABNORMAL HIGH (ref 70–99)
Potassium: 5.7 mEq/L — ABNORMAL HIGH (ref 3.5–5.1)
Sodium: 130 mEq/L — ABNORMAL LOW (ref 135–145)

## 2013-03-16 LAB — PRO B NATRIURETIC PEPTIDE: Pro B Natriuretic peptide (BNP): 2521 pg/mL — ABNORMAL HIGH (ref 0–450)

## 2013-03-17 LAB — BASIC METABOLIC PANEL
Calcium: 10.1 mg/dL (ref 8.4–10.5)
GFR calc Af Amer: 28 mL/min — ABNORMAL LOW (ref >90)
GFR calc non Af Amer: 24 mL/min — ABNORMAL LOW (ref >90)
Glucose, Bld: 190 mg/dL — ABNORMAL HIGH (ref 70–99)
Potassium: 4.4 mEq/L (ref 3.5–5.1)
Sodium: 132 mEq/L — ABNORMAL LOW (ref 135–145)

## 2013-03-19 LAB — COMPREHENSIVE METABOLIC PANEL
ALT: 15 U/L (ref 0–35)
AST: 15 U/L (ref 0–37)
Albumin: 2.8 g/dL — ABNORMAL LOW (ref 3.5–5.2)
Alkaline Phosphatase: 93 U/L (ref 39–117)
Chloride: 98 mEq/L (ref 96–112)
Potassium: 4.1 mEq/L (ref 3.5–5.1)
Sodium: 136 mEq/L (ref 135–145)
Total Bilirubin: 0.3 mg/dL (ref 0.3–1.2)
Total Protein: 6.1 g/dL (ref 6.0–8.3)

## 2013-03-19 LAB — DIFFERENTIAL
Basophils Relative: 0 % (ref 0–1)
Eosinophils Absolute: 0.2 10*3/uL (ref 0.0–0.7)
Eosinophils Relative: 1 % (ref 0–5)
Monocytes Absolute: 0.7 10*3/uL (ref 0.1–1.0)
Neutro Abs: 6.7 10*3/uL (ref 1.7–7.7)
Neutrophils Relative %: 29 % — ABNORMAL LOW (ref 43–77)

## 2013-03-19 LAB — CBC
HCT: 28.5 % — ABNORMAL LOW (ref 36.0–46.0)
Hemoglobin: 9.1 g/dL — ABNORMAL LOW (ref 12.0–15.0)
MCH: 29.1 pg (ref 26.0–34.0)
MCHC: 31.9 g/dL (ref 30.0–36.0)
RBC: 3.13 MIL/uL — ABNORMAL LOW (ref 3.87–5.11)

## 2013-03-19 LAB — CHOLESTEROL, TOTAL: Cholesterol: 167 mg/dL (ref 0–200)

## 2013-03-20 LAB — TROPONIN I: Troponin I: <0.3 ng/mL (ref <0.30)

## 2013-03-21 LAB — BASIC METABOLIC PANEL
BUN: 49 mg/dL — ABNORMAL HIGH (ref 6–23)
Calcium: 9.5 mg/dL (ref 8.4–10.5)
Creatinine, Ser: 1.34 mg/dL — ABNORMAL HIGH (ref 0.50–1.10)
GFR calc Af Amer: 40 mL/min — ABNORMAL LOW (ref >90)
GFR calc non Af Amer: 35 mL/min — ABNORMAL LOW (ref >90)
Potassium: 4.7 mEq/L (ref 3.5–5.1)

## 2013-03-21 LAB — CBC
HCT: 27.7 % — ABNORMAL LOW (ref 36.0–46.0)
MCHC: 31.8 g/dL (ref 30.0–36.0)
RDW: 16.3 % — ABNORMAL HIGH (ref 11.5–15.5)

## 2013-03-22 LAB — PHOSPHORUS: Phosphorus: 3.6 mg/dL (ref 2.3–4.6)

## 2013-03-22 LAB — MAGNESIUM: Magnesium: 2 mg/dL (ref 1.5–2.5)

## 2013-03-22 LAB — COMPREHENSIVE METABOLIC PANEL
Albumin: 2.6 g/dL — ABNORMAL LOW (ref 3.5–5.2)
BUN: 45 mg/dL — ABNORMAL HIGH (ref 6–23)
Calcium: 9.6 mg/dL (ref 8.4–10.5)
Creatinine, Ser: 1.31 mg/dL — ABNORMAL HIGH (ref 0.50–1.10)
Potassium: 3.4 mEq/L — ABNORMAL LOW (ref 3.5–5.1)
Total Protein: 5.4 g/dL — ABNORMAL LOW (ref 6.0–8.3)

## 2013-03-22 LAB — BASIC METABOLIC PANEL
Chloride: 99 mEq/L (ref 96–112)
GFR calc Af Amer: 40 mL/min — ABNORMAL LOW (ref >90)
GFR calc non Af Amer: 34 mL/min — ABNORMAL LOW (ref >90)
Potassium: 3.9 mEq/L (ref 3.5–5.1)
Sodium: 135 mEq/L (ref 135–145)

## 2013-03-23 LAB — BASIC METABOLIC PANEL
BUN: 42 mg/dL — ABNORMAL HIGH (ref 6–23)
Chloride: 98 mEq/L (ref 96–112)
GFR calc Af Amer: 42 mL/min — ABNORMAL LOW (ref >90)
Glucose, Bld: 172 mg/dL — ABNORMAL HIGH (ref 70–99)
Potassium: 3.6 mEq/L (ref 3.5–5.1)
Sodium: 134 mEq/L — ABNORMAL LOW (ref 135–145)

## 2013-03-23 LAB — CBC
HCT: 27.6 % — ABNORMAL LOW (ref 36.0–46.0)
Hemoglobin: 8.8 g/dL — ABNORMAL LOW (ref 12.0–15.0)
WBC: 22.4 10*3/uL — ABNORMAL HIGH (ref 4.0–10.5)

## 2013-03-24 ENCOUNTER — Other Ambulatory Visit (HOSPITAL_COMMUNITY): Payer: Medicare Other

## 2013-03-24 LAB — BASIC METABOLIC PANEL
CO2: 30 mEq/L (ref 19–32)
Calcium: 9.4 mg/dL (ref 8.4–10.5)
GFR calc non Af Amer: 33 mL/min — ABNORMAL LOW (ref >90)
Glucose, Bld: 109 mg/dL — ABNORMAL HIGH (ref 70–99)
Potassium: 4.1 mEq/L (ref 3.5–5.1)
Sodium: 136 mEq/L (ref 135–145)

## 2013-03-24 LAB — MAGNESIUM: Magnesium: 2.2 mg/dL (ref 1.5–2.5)

## 2013-03-25 LAB — BASIC METABOLIC PANEL
Calcium: 9.3 mg/dL (ref 8.4–10.5)
GFR calc non Af Amer: 40 mL/min — ABNORMAL LOW (ref >90)
Glucose, Bld: 170 mg/dL — ABNORMAL HIGH (ref 70–99)
Potassium: 4 mEq/L (ref 3.5–5.1)
Sodium: 131 mEq/L — ABNORMAL LOW (ref 135–145)

## 2013-03-26 LAB — DIFFERENTIAL
Basophils Relative: 0 % (ref 0–1)
Eosinophils Relative: 1 % (ref 0–5)
Monocytes Absolute: 0.4 10*3/uL (ref 0.1–1.0)
Neutro Abs: 5.6 10*3/uL (ref 1.7–7.7)
Neutrophils Relative %: 26 % — ABNORMAL LOW (ref 43–77)

## 2013-03-26 LAB — OCCULT BLOOD X 1 CARD TO LAB, STOOL: Fecal Occult Bld: NEGATIVE

## 2013-03-26 LAB — COMPREHENSIVE METABOLIC PANEL
ALT: 12 U/L (ref 0–35)
AST: 11 U/L (ref 0–37)
Albumin: 2.5 g/dL — ABNORMAL LOW (ref 3.5–5.2)
Alkaline Phosphatase: 82 U/L (ref 39–117)
Chloride: 98 mEq/L (ref 96–112)
Potassium: 3.6 mEq/L (ref 3.5–5.1)
Sodium: 132 mEq/L — ABNORMAL LOW (ref 135–145)
Total Bilirubin: 0.3 mg/dL (ref 0.3–1.2)
Total Protein: 5.5 g/dL — ABNORMAL LOW (ref 6.0–8.3)

## 2013-03-26 LAB — CBC
HCT: 26.3 % — ABNORMAL LOW (ref 36.0–46.0)
Hemoglobin: 8.6 g/dL — ABNORMAL LOW (ref 12.0–15.0)
MCH: 29.4 pg (ref 26.0–34.0)
MCHC: 32.7 g/dL (ref 30.0–36.0)
MCV: 89.8 fL (ref 78.0–100.0)
RBC: 2.93 MIL/uL — ABNORMAL LOW (ref 3.87–5.11)

## 2013-03-26 LAB — PATHOLOGIST SMEAR REVIEW

## 2013-03-26 LAB — PREALBUMIN: Prealbumin: 19.9 mg/dL (ref 17.0–34.0)

## 2013-03-27 ENCOUNTER — Ambulatory Visit: Payer: Self-pay | Admitting: Internal Medicine

## 2013-03-27 LAB — BASIC METABOLIC PANEL
Calcium: 9.2 mg/dL (ref 8.4–10.5)
Chloride: 101 mEq/L (ref 96–112)
Creatinine, Ser: 1.06 mg/dL (ref 0.50–1.10)
GFR calc Af Amer: 54 mL/min — ABNORMAL LOW (ref >90)
Sodium: 134 mEq/L — ABNORMAL LOW (ref 135–145)

## 2013-03-27 LAB — PRO B NATRIURETIC PEPTIDE: Pro B Natriuretic peptide (BNP): 1585 pg/mL — ABNORMAL HIGH (ref 0–450)

## 2013-03-29 LAB — BASIC METABOLIC PANEL
BUN: 28 mg/dL — ABNORMAL HIGH (ref 6–23)
CO2: 21 mEq/L (ref 19–32)
Chloride: 101 mEq/L (ref 96–112)
GFR calc non Af Amer: 47 mL/min — ABNORMAL LOW (ref >90)
Glucose, Bld: 171 mg/dL — ABNORMAL HIGH (ref 70–99)
Potassium: 4.2 mEq/L (ref 3.5–5.1)
Sodium: 133 mEq/L — ABNORMAL LOW (ref 135–145)

## 2013-03-30 ENCOUNTER — Encounter
Admission: AD | Disposition: A | Discharge status: Skilled Nursing Facility | Payer: Self-pay | Source: Ambulatory Visit | Attending: Internal Medicine

## 2013-03-30 DIAGNOSIS — K259 Gastric ulcer, unspecified as acute or chronic, without hemorrhage or perforation: Secondary | ICD-10-CM

## 2013-03-30 DIAGNOSIS — A0472 Enterocolitis due to Clostridium difficile, not specified as recurrent: Secondary | ICD-10-CM

## 2013-03-30 HISTORY — PX: ESOPHAGOGASTRODUODENOSCOPY: SHX5428

## 2013-03-30 LAB — BASIC METABOLIC PANEL
BUN: 29 mg/dL — ABNORMAL HIGH (ref 6–23)
CO2: 25 mEq/L (ref 19–32)
Calcium: 9.5 mg/dL (ref 8.4–10.5)
GFR calc non Af Amer: 44 mL/min — ABNORMAL LOW (ref >90)
Glucose, Bld: 121 mg/dL — ABNORMAL HIGH (ref 70–99)
Sodium: 133 mEq/L — ABNORMAL LOW (ref 135–145)

## 2013-03-30 LAB — CBC
HCT: 29 % — ABNORMAL LOW (ref 36.0–46.0)
Hemoglobin: 9.3 g/dL — ABNORMAL LOW (ref 12.0–15.0)
MCH: 29.2 pg (ref 26.0–34.0)
MCHC: 32.1 g/dL (ref 30.0–36.0)
RBC: 3.18 MIL/uL — ABNORMAL LOW (ref 3.87–5.11)

## 2013-03-30 SURGERY — EGD (ESOPHAGOGASTRODUODENOSCOPY)
Anesthesia: Moderate Sedation

## 2013-03-30 MED ORDER — BUTAMBEN-TETRACAINE-BENZOCAINE 2-2-14 % EX AERO
INHALATION_SPRAY | CUTANEOUS | Status: DC | PRN
  Administered 2013-03-30: 1 via TOPICAL

## 2013-03-30 MED ORDER — FENTANYL CITRATE 0.05 MG/ML IJ SOLN
INTRAMUSCULAR | Status: DC | PRN
  Administered 2013-03-30: 25 ug via INTRAVENOUS

## 2013-03-30 MED ORDER — MIDAZOLAM HCL 10 MG/2ML IJ SOLN
INTRAMUSCULAR | Status: DC | PRN
  Administered 2013-03-30 (×2): 2 mg via INTRAVENOUS

## 2013-03-30 NOTE — Op Note (Signed)
[facility] [address] [place], [address]   ENDOSCOPY PROCEDURE REPORT  PATIENT: Chelsea [name], Chelsea [name]  MR#: [id number] BIRTHDATE: Jul 25, 1925 , 88  yrs. old GENDER: Female ENDOSCOPIST: Hart Carwin, MD REFERRED BY:  Dr Carron Curie PROCEDURE DATE:  03/30/2013 PROCEDURE:  EGD w/ biopsy ASA CLASS:     Class III INDICATIONS:  Surveillance.  of large ischemic gastric ulcer from 02/25/2013 in [facility] MEDICATIONS: These medications were titrated to patient response per physician's verbal order, Versed 4 mg IV, and Fentanyl 25 mcg IV TOPICAL ANESTHETIC: Cetacaine Spray  DESCRIPTION OF PROCEDURE: After the risks benefits and alternatives of the procedure were thoroughly explained, informed consent was obtained.  The Pentax Gastroscope [id number] endoscope was introduced through the mouth and advanced to the second portion of the duodenum. Without limitations.  The instrument was slowly withdrawn as the mucosa was fully examined.      EOPHAGUS: normal mucosa of the proximal, mid and distal esophagus, non obstructing  Schatzki's ring at g-e junction[  STOMACH:  stomach was infufflated with air and showed 3 cm non reducible hiatal hernia, distal to the hernia was an extensive shallow serpeginous ulceration extending along the greater curvature of the stomach  in a wide  plane toward incissura, sparing prepyloric antrum. Multiple biopsies were taken from the friablr edges, there was no bleeding  except due to biopsies, Pyloric outlet was normal, There was small amount of retained food in gastric body  .Retroflexion confirmed presence of hiatal hernia The scope was then withdrawn from the patient and the procedure completed.  COMPLICATIONS: There were no complications. ENDOSCOPIC IMPRESSION: 1. Persistent large serpeginous gastric ulceration similar to the one  noted on 02/25/2013, c/w ischemic injury, biopsies taken, no active bleeding 2. small amount of  retained bile 3. Schatzli's ring and hiatal hernia Oveall the picture is about the same or slightly better, it may take several months for this ulcer to heal, in the meantime she may take enteral feedings ,at frequent intervals,, ( may have a secondary gastroparesis)  RECOMMENDATIONS: Await pathology results Full liquid diet, consider GES to assess degree of gastroparesis  REPEAT EXAM: for EGD pending biopsy results.  eSigned:  Hart Carwin, MD 03/30/2013 2:29 PM   CC:  PATIENT NAME:  Chelsea [name], Chelsea [name] MR#: [id number]

## 2013-03-30 NOTE — Consult Note (Cosign Needed)
[facility] Consult: for pt at [facility] 11:11 AM 03/30/2013   Referring Provider: Wynonia Lawman [facility]  Primary Care Physician:  Dorothey Baseman MD.  Of [facility] Dr Darrold Junker is cardiologist.  Primary Gastroenterologist:  Dr. Midge Minium, [place].   Reason for Consultation:  Hx gastric ischemia, MD wants follow up EGD before restarting enteral feeding.   HPI: Chelsea [name] is a 79 y.o. female.  Treated for gastric ischemia and C diff associated diarrhea during hospitalization 4/12 - 4/24 at [facility].  Transferred for rehab to [facility]. Underwent EGD 4/20 by Dr Midge Minium.  This revealed large area of eschar and underlying ischemic mucosa at greater curvature of stomach.  CT angiogram showed no significant mesenteric ischemia.  Dr Servando Snare planned repeat EGD in 4 to 6 weeks.  In meantime has been receiving TPN for nutritional support.  Pt had been taking abx for cellulitis for 2 weeks before developing diarrhea which started 3 days PTA.  The abx used to treat the C diff was not specified in discharge summary, but 10 days of therapy was completed prior to transfer.  She has soft stools but no diarrhea, no nausea, no abdominal pain for many days.  Appetite not yet returned to her baseline, which is generally good.   On 4/28, Dr Elease Hashimoto did not feel minor elevation of Troponin I in setting of elevated pBNP, warranted any further testing.     Past Medical History CHF (congestive heart failure) CLL COPD DM, type 2 insulin requiring. 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   Lymphocytic leukemia Gastric ischemia 02/2013.  C Diff associated colitis 02/2013 and in 2006 Anemia, Normocytic Hyponatremia Arthritis Glaucoma Basal cell skin cancer   Past Surgical History  Procedure Laterality Date  . Abdominal hysterectomy    . Appendectomy      Scheduled Meds: TPN Novolog SSI,  Lantus  insulin Gabapentin Heparin Sub Q Miralax 17 grams daily Nystatin Oxycodone Oxybutinin Protonix 40 mg daily Risaquad Sucralfate QID   Allergies Allopurinol:  Acute renal failure Darvocet PCNs Prednisone  No family history on file.  History   Social History  . Marital Status: Married   Occupational History  . Retired Comptroller at [employer] History Main Topics  . Smoking status: Former Smoker -- 30 years    Quit date: 11/08/1970  . Smokeless tobacco: Never Used  . Alcohol Use: No  . Drug Use: No  . Sexually Active: Not on file   Social History Narrative   Lives with husband at [facility] independent living.    REVIEW OF SYSTEMS: Constitutional:  Generalized weakness is improving.  Stamina is better.  Feels ready to go home if allowed.   ENT: no nose bleeds Pulm:  Orthopnea and PND CV:  No chest pain or palpitations GU:  No dysuria or frequency GI:  No dysphagia, no heartburn Heme:  No excessive bleeding or bruising.    Transfusions:  None recently Neuro:  No headache or vision problem Derm:  Wound on LLE.  LE cellulitis requiring Unna boot treatment.  Endocrine:  No excessive thirst Immunization:  Not queried Travel:  Not queried.    PHYSICAL EXAM: Vital signs in last 24 hours:  97.2  117/65   resp 16   Pulse  80  resp 100  Weight 206#  General: pleasant, well-appearing elderly WF, looks younger than age Head:  No facial edema or asymmetry  Eyes:  No icterus or pallor.  EOMI Ears:  Not HOH  Nose:  No congestion or discharge Mouth:  Clear, pink mucosa Neck:  No mass, no JVD Lungs:  Clear.  Breathing unlabored. Heart: RRR.  No MRG Abdomen:  Soft, ND, obese, no HSM or mass.  BS active.  Slightly tender bil, not focal.   Rectal: deferred   Musc/Skeltl: no joint erythema Extremities:  bil LE edema:  Both legs extensively wrapped  Neurologic:  Pleasant, intelligent, no confusion, no tremor Skin:  No rash  Tattoos:  none Nodes:  No inguinal  adenopathy.    Psych:  Pleasant.  In good spirits.    LAB RESULTS:  Recent Labs  03/30/13 0600  WBC 17.5*  HGB 9.3*  HCT 29.0*  PLT 152   BMET Lab Results  Component Value Date   NA 133* 03/30/2013   NA 133* 03/29/2013   NA 134* 03/27/2013   K 4.0 03/30/2013   K 4.2 03/29/2013   K 4.1 03/27/2013   CL 100 03/30/2013   CL 101 03/29/2013   CL 101 03/27/2013   CO2 25 03/30/2013   CO2 21 03/29/2013   CO2 24 03/27/2013   GLUCOSE 121* 03/30/2013   GLUCOSE 171* 03/29/2013   GLUCOSE 183* 03/27/2013   BUN 29* 03/30/2013   BUN 28* 03/29/2013   BUN 28* 03/27/2013   CREATININE 1.11* 03/30/2013   CREATININE 1.05 03/29/2013   CREATININE 1.06 03/27/2013   CALCIUM 9.5 03/30/2013   CALCIUM 9.5 03/29/2013   CALCIUM 9.2 03/27/2013   LFT No results found for this basename: PROT, ALBUMIN, AST, ALT, ALKPHOS, BILITOT, BILIDIR, IBILI,  in the last 72 hours PT/INR No results found for this basename: INR,  PROTIME   Stool C-Diff pcr negative 03/19/13 FOB negative 03/26/13   ENDOSCOPIC STUDIES: EGD as above Colonoscopy > 5 years ago  IMPRESSION: *  Gastric ischemia on EGD at [facility] .  Abdominal pain and nausea all resolved for many days, appetite still diminished. Plan per GI was for 4 to 6 week follow up EGD.  On Carafate and Protonix.  *  C diff associated Colitis.  Follow up C diff PCR negative on 5/12. No diarrhea for many days. Note getting Miralax daily. *  LE cellulitis, off all abx.  *  CHF.  EF by Echo 3 weeks ago: 45 to 50% *  Debilitated, improving *  Normocytic anemia *  Hyponatremia  *  Chronic kidney disease.  GFR 44.  PLAN: *  EGD, timing to be decided by Dr Juanda Chance   LOS: 29 days   Jennye Moccasin  03/30/2013, 11:11 AM Pager: [phone] Attending MD note:   I have reviewed the above note, examined the patient and agree with plan of treatment. Follow up EGD requested before resuming enteral feedings, Will proceed today.  [facility] Pager # [phone]

## 2013-03-30 NOTE — Interval H&P Note (Signed)
History and Physical Interval Note:  03/30/2013 1:53 PM  Chelsea [name]  has presented today for surgery, with the diagnosis of aseess ischemia in stomach  The various methods of treatment have been discussed with the patient and family. After consideration of risks, benefits and other options for treatment, the patient has consented to  Procedure(s): ESOPHAGOGASTRODUODENOSCOPY (EGD) (N/A) as a surgical intervention .  The patient's history has been reviewed, patient examined, no change in status, stable for surgery.  I have reviewed the patient's chart and labs.  Questions were answered to the patient's satisfaction.     Lina Sar

## 2013-03-30 NOTE — H&P (View-Only) (Signed)
Patient ID: Chelsea [name], female   DOB: 11/08/1924, 79 y.o.   MRN: [id number]   I printed a copy of the consult note done by Dr. Nahser March 05, 2013. I placed this printed copy in the [facility] chart. The patient is stable today.    Jeff Katz, MD 

## 2013-03-31 NOTE — Progress Notes (Signed)
.  [facility] Progress Note   Subjective  No complaints, tolerated full liquids last night   Objective   S/o  For persistent large gastric ulcer, showed signs of healing, she has been on TNA but now tolerating full liquids Vital signs in last 24 hours: Temp:  [98.4 F (36.9 C)] 98.4 F (36.9 C) (05/23 1342) Pulse Rate:  [68] 68 (05/23 1342) Resp:  [16-33] 24 (05/23 1445) BP: (121-162)/(49-79) 121/57 mmHg (05/23 1445) SpO2:  [97 %-100 %] 97 % (05/23 1445) Weight:  [203 lb (92.08 kg)] 203 lb (92.08 kg) (05/23 1342)   General:    white female in NAD Heart:  Regular rate and rhythm; no murmurs Lungs: Respirations even and unlabored, lungs CTA bilaterally Abdomen:  Soft, nontender and nondistended. Normal bowel sounds. Extremities:  Without edema. Neurologic:  Alert and oriented,  grossly normal neurologically. Psych:  Cooperative. Normal mood and affect.  Intake/Output from previous day:   Intake/Output this shift:    Lab Results:  Recent Labs  03/30/13 0600  WBC 17.5*  HGB 9.3*  HCT 29.0*  PLT 152   BMET  Recent Labs  03/29/13 0640 03/30/13 0600  NA 133* 133*  K 4.2 4.0  CL 101 100  CO2 21 25  GLUCOSE 171* 121*  BUN 28* 29*  CREATININE 1.05 1.11*  CALCIUM 9.5 9.5   LFT No results found for this basename: PROT, ALBUMIN, AST, ALT, ALKPHOS, BILITOT, BILIDIR, IBILI,  in the last 72 hours PT/INR No results found for this basename: LABPROT, INR,  in the last 72 hours  Studies/Results: No results found.     Assessment:  Stable slowly healing GU     Plan:  Advance diet to soft tomorrow May be able to taper the TPN  biopsies pending  Active Problems:   Diabetes mellitus   CHF (congestive heart failure)   COPD (chronic obstructive pulmonary disease)   C. difficile colitis   Non-ST elevation myocardial infarction (NSTEMI), initial care episode   Gastric ulcer     LOS: 30 days   Lina Sar  03/31/2013, 9:09 AM

## 2013-04-01 LAB — BASIC METABOLIC PANEL
BUN: 31 mg/dL — ABNORMAL HIGH (ref 6–23)
Chloride: 100 mEq/L (ref 96–112)
Creatinine, Ser: 1.17 mg/dL — ABNORMAL HIGH (ref 0.50–1.10)
GFR calc non Af Amer: 41 mL/min — ABNORMAL LOW (ref >90)
Glucose, Bld: 148 mg/dL — ABNORMAL HIGH (ref 70–99)
Potassium: 3.9 mEq/L (ref 3.5–5.1)

## 2013-04-01 LAB — CBC
HCT: 27 % — ABNORMAL LOW (ref 36.0–46.0)
Hemoglobin: 8.8 g/dL — ABNORMAL LOW (ref 12.0–15.0)
MCH: 29.6 pg (ref 26.0–34.0)
MCHC: 32.6 g/dL (ref 30.0–36.0)
RDW: 16.8 % — ABNORMAL HIGH (ref 11.5–15.5)

## 2013-04-01 NOTE — Progress Notes (Signed)
.  [facility] Progress Note   Subjective  Ready to eat solid food   Objective  Tolerating full liquids, no abd. pain Vital signs in last 24 hours:     General:    white female in NAD Heart:  Regular rate and rhythm; no murmurs Lungs: Respirations even and unlabored, lungs CTA bilaterally Abdomen:  Soft, nontender and nondistended. Normal bowel sounds. Extremities:  Without edema. Neurologic:  Alert and oriented,  grossly normal neurologically. Psych:  Cooperative. Normal mood and affect.  Intake/Output from previous day:   Intake/Output this shift:    Lab Results:  Recent Labs  03/30/13 0600 04/01/13 0532  WBC 17.5* 14.3*  HGB 9.3* 8.8*  HCT 29.0* 27.0*  PLT 152 140*   BMET  Recent Labs  03/30/13 0600 04/01/13 0532  NA 133* 133*  K 4.0 3.9  CL 100 100  CO2 25 24  GLUCOSE 121* 148*  BUN 29* 31*  CREATININE 1.11* 1.17*  CALCIUM 9.5 9.3   LFT No results found for this basename: PROT, ALBUMIN, AST, ALT, ALKPHOS, BILITOT, BILIDIR, IBILI,  in the last 72 hours PT/INR No results found for this basename: LABPROT, INR,  in the last 72 hours  Studies/Results: No results found.     Assessment:  Healing ischemic gastric ulcer, tolerating full liquids     Plan:  OK to advance to regular diet, small amounts OK to taper cTNA if oral feedings  tolerated  Active Problems:   Diabetes mellitus   CHF (congestive heart failure)   COPD (chronic obstructive pulmonary disease)   C. difficile colitis   Non-ST elevation myocardial infarction (NSTEMI), initial care episode   Gastric ulcer     LOS: 31 days   Chelsea [name]  04/01/2013, 10:48 AM

## 2013-04-03 LAB — BASIC METABOLIC PANEL
BUN: 33 mg/dL — ABNORMAL HIGH (ref 6–23)
CO2: 26 mEq/L (ref 19–32)
Calcium: 9.2 mg/dL (ref 8.4–10.5)
Chloride: 102 mEq/L (ref 96–112)
Creatinine, Ser: 1.2 mg/dL — ABNORMAL HIGH (ref 0.50–1.10)
GFR calc Af Amer: 46 mL/min — ABNORMAL LOW (ref >90)
GFR calc non Af Amer: 40 mL/min — ABNORMAL LOW (ref >90)
Glucose, Bld: 53 mg/dL — ABNORMAL LOW (ref 70–99)
Potassium: 3.9 mEq/L (ref 3.5–5.1)
Sodium: 136 mEq/L (ref 135–145)

## 2013-04-03 LAB — CBC
MCH: 29.9 pg (ref 26.0–34.0)
MCHC: 32.7 g/dL (ref 30.0–36.0)
MCV: 91.5 fL (ref 78.0–100.0)
Platelets: 138 10*3/uL — ABNORMAL LOW (ref 150–400)
RDW: 16.8 % — ABNORMAL HIGH (ref 11.5–15.5)

## 2013-04-05 LAB — CBC
MCH: 29.5 pg (ref 26.0–34.0)
MCHC: 31.7 g/dL (ref 30.0–36.0)
Platelets: 149 10*3/uL — ABNORMAL LOW (ref 150–400)

## 2013-04-05 LAB — BASIC METABOLIC PANEL
Calcium: 9.2 mg/dL (ref 8.4–10.5)
GFR calc non Af Amer: 32 mL/min — ABNORMAL LOW (ref >90)
Glucose, Bld: 104 mg/dL — ABNORMAL HIGH (ref 70–99)
Sodium: 134 mEq/L — ABNORMAL LOW (ref 135–145)

## 2013-04-06 LAB — BASIC METABOLIC PANEL
BUN: 25 mg/dL — ABNORMAL HIGH (ref 6–23)
GFR calc Af Amer: 37 mL/min — ABNORMAL LOW (ref >90)
GFR calc non Af Amer: 32 mL/min — ABNORMAL LOW (ref >90)
Potassium: 4.1 mEq/L (ref 3.5–5.1)
Sodium: 134 mEq/L — ABNORMAL LOW (ref 135–145)

## 2013-04-10 DIAGNOSIS — K259 Gastric ulcer, unspecified as acute or chronic, without hemorrhage or perforation: Secondary | ICD-10-CM

## 2013-04-10 DIAGNOSIS — I5022 Chronic systolic (congestive) heart failure: Secondary | ICD-10-CM

## 2013-04-10 DIAGNOSIS — N184 Chronic kidney disease, stage 4 (severe): Secondary | ICD-10-CM

## 2013-04-10 DIAGNOSIS — E1129 Type 2 diabetes mellitus with other diabetic kidney complication: Secondary | ICD-10-CM

## 2013-04-11 ENCOUNTER — Inpatient Hospital Stay: Payer: Self-pay | Admitting: Internal Medicine

## 2013-04-11 ENCOUNTER — Telehealth: Payer: Self-pay

## 2013-04-11 LAB — CK TOTAL AND CKMB (NOT AT ARMC)
CK, Total: 67 U/L (ref 21–215)
CK, Total: 62 U/L (ref 21–215)
CK-MB: 1.7 ng/mL (ref 0.5–3.6)
CK-MB: 8.7 ng/mL — ABNORMAL HIGH (ref 0.5–3.6)

## 2013-04-11 LAB — COMPREHENSIVE METABOLIC PANEL
Albumin: 3.1 g/dL — ABNORMAL LOW (ref 3.4–5.0)
Alkaline Phosphatase: 73 U/L (ref 50–136)
BUN: 13 mg/dL (ref 7–18)
Calcium, Total: 8.7 mg/dL (ref 8.5–10.1)
Chloride: 106 mmol/L (ref 98–107)
Co2: 23 mmol/L (ref 21–32)
EGFR (African American): 42 — ABNORMAL LOW
EGFR (Non-African Amer.): 36 — ABNORMAL LOW
Glucose: 172 mg/dL — ABNORMAL HIGH (ref 65–99)
Osmolality: 280 (ref 275–301)
Potassium: 3.2 mmol/L — ABNORMAL LOW (ref 3.5–5.1)
SGOT(AST): 20 U/L (ref 15–37)
SGPT (ALT): 16 U/L (ref 12–78)
Total Protein: 6.3 g/dL — ABNORMAL LOW (ref 6.4–8.2)

## 2013-04-11 LAB — CBC WITH DIFFERENTIAL/PLATELET
Bands: 0 %
Basophil #: 0 10*3/uL (ref 0.0–0.1)
Eosinophil %: 0 %
Eosinophil: 1 %
HCT: 27.3 % — ABNORMAL LOW (ref 35.0–47.0)
HCT: 31.2 % — ABNORMAL LOW (ref 35.0–47.0)
HGB: 8.8 g/dL — ABNORMAL LOW (ref 12.0–16.0)
Lymphocyte #: 11.9 10*3/uL — ABNORMAL HIGH (ref 1.0–3.6)
MCH: 28.5 pg (ref 26.0–34.0)
MCHC: 31.5 g/dL — ABNORMAL LOW (ref 32.0–36.0)
Monocyte %: 0.6 %
Neutrophil %: 33.1 %
Platelet: 160 10*3/uL (ref 150–440)
RDW: 16 % — ABNORMAL HIGH (ref 11.5–14.5)
RDW: 16.1 % — ABNORMAL HIGH (ref 11.5–14.5)
WBC: 17.9 10*3/uL — ABNORMAL HIGH (ref 3.6–11.0)
WBC: 39.1 10*3/uL — ABNORMAL HIGH (ref 3.6–11.0)

## 2013-04-11 LAB — APTT
Activated PTT: 32.6 secs (ref 23.6–35.9)
Activated PTT: 85.6 secs — ABNORMAL HIGH (ref 23.6–35.9)

## 2013-04-11 LAB — URINALYSIS, COMPLETE
Nitrite: POSITIVE
Ph: 5 (ref 4.5–8.0)
RBC,UR: 24 /HPF (ref 0–5)
Specific Gravity: 1.018 (ref 1.003–1.030)

## 2013-04-11 NOTE — Telephone Encounter (Signed)
Will review status at [facility]

## 2013-04-11 NOTE — Telephone Encounter (Signed)
Num: [id number] Operator: Roselyn Meier Patient Name: Chelsea [name] Call Date & Time: 04/10/2013 10:45:34PM Patient Phone: [phone] PCP: Tillman Abide Patient Gender: Female PCP Fax : [phone] Patient DOB: January 18, 1925 Practice Name: [facility] Reason for Call: Caller: Delores/LPN; PCP: Tillman Abide (Family Practice); CB#: [phone]; Call regarding Send to ED, respiratory distress; Onset 2130 with Respiratory distress, caller reports that it has escalated. Caller states pt vital signs are: BP - 180/90, HR 115, RR 32, Temp 96.2 -ax, and SaO2 - 81% on 2LO2. Caller states she wants to send pt out and pt wants to go out. Emergent symptom of 'Sudden onset of severe breathing difficulty' identified in the Breathing Problems Protocol. RN advised for pt to be seen at the hospital and for 911 to be called. Protocol(s) Used: Breathing Problems Recommended Outcome per Protocol: Activate EMS 911 Reason for Outcome: Sudden onset of severe breathing difficulty Care Advice: ~ 06/

## 2013-04-12 LAB — PLATELET COUNT: Platelet: 217 10*3/uL (ref 150–440)

## 2013-04-12 LAB — APTT: Activated PTT: 78.5 secs — ABNORMAL HIGH (ref 23.6–35.9)

## 2013-04-12 LAB — POTASSIUM: Potassium: 3.7 mmol/L (ref 3.5–5.1)

## 2013-04-13 LAB — PLATELET COUNT: Platelet: 203 10*3/uL (ref 150–440)

## 2013-04-13 LAB — BASIC METABOLIC PANEL
Chloride: 100 mmol/L (ref 98–107)
EGFR (Non-African Amer.): 24 — ABNORMAL LOW
Glucose: 109 mg/dL — ABNORMAL HIGH (ref 65–99)
Sodium: 136 mmol/L (ref 136–145)

## 2013-04-13 LAB — APTT: Activated PTT: 100 secs — ABNORMAL HIGH (ref 23.6–35.9)

## 2013-04-16 DIAGNOSIS — I5032 Chronic diastolic (congestive) heart failure: Secondary | ICD-10-CM

## 2013-04-16 LAB — CULTURE, BLOOD (SINGLE)

## 2013-06-14 ENCOUNTER — Emergency Department: Payer: Self-pay | Admitting: Emergency Medicine

## 2013-06-14 LAB — CBC
HCT: 34.8 % — ABNORMAL LOW
HGB: 11.1 g/dL — ABNORMAL LOW
MCH: 27.1 pg
MCHC: 32 g/dL
MCV: 85 fL
Platelet: 168 x10 3/mm 3
RBC: 4.11 X10 6/mm 3
RDW: 17 % — ABNORMAL HIGH
WBC: 16.8 x10 3/mm 3 — ABNORMAL HIGH

## 2013-06-14 LAB — BASIC METABOLIC PANEL WITH GFR
Anion Gap: 2 — ABNORMAL LOW
BUN: 24 mg/dL — ABNORMAL HIGH
Calcium, Total: 9.4 mg/dL
Chloride: 103 mmol/L
Co2: 31 mmol/L
Creatinine: 1.62 mg/dL — ABNORMAL HIGH
EGFR (African American): 33 — ABNORMAL LOW
EGFR (Non-African Amer.): 28 — ABNORMAL LOW
Glucose: 130 mg/dL — ABNORMAL HIGH
Osmolality: 278
Potassium: 4.7 mmol/L
Sodium: 136 mmol/L

## 2013-06-14 LAB — CK TOTAL AND CKMB (NOT AT ARMC)
CK, Total: 35 U/L
CK-MB: 0.5 ng/mL

## 2013-06-14 LAB — PROTIME-INR
INR: 1
Prothrombin Time: 13.5 s

## 2013-06-14 LAB — TROPONIN I: Troponin-I: 0.03 ng/mL

## 2013-07-10 ENCOUNTER — Ambulatory Visit: Payer: Self-pay | Admitting: Internal Medicine

## 2013-07-10 LAB — CBC CANCER CENTER
Basophil %: 0.5 %
HCT: 35.7 % (ref 35.0–47.0)
HGB: 11.3 g/dL — ABNORMAL LOW (ref 12.0–16.0)
Lymphocyte %: 55.9 %
MCH: 26.5 pg (ref 26.0–34.0)
MCHC: 31.7 g/dL — ABNORMAL LOW (ref 32.0–36.0)
Monocyte #: 0.8 x10 3/mm (ref 0.2–0.9)
Monocyte %: 4 %
Neutrophil %: 37.6 %
RDW: 17.1 % — ABNORMAL HIGH (ref 11.5–14.5)
WBC: 20.4 x10 3/mm — ABNORMAL HIGH (ref 3.6–11.0)

## 2013-08-08 ENCOUNTER — Ambulatory Visit: Payer: Self-pay | Admitting: Internal Medicine

## 2013-08-17 ENCOUNTER — Ambulatory Visit: Payer: Self-pay | Admitting: Podiatry

## 2013-10-30 ENCOUNTER — Encounter: Payer: Self-pay | Admitting: Podiatry

## 2013-10-30 ENCOUNTER — Ambulatory Visit (INDEPENDENT_AMBULATORY_CARE_PROVIDER_SITE_OTHER): Payer: Medicare Other | Admitting: Podiatry

## 2013-10-30 VITALS — BP 114/52 | HR 81 | Resp 16 | Ht 62.0 in | Wt 170.0 lb

## 2013-10-30 DIAGNOSIS — M79609 Pain in unspecified limb: Secondary | ICD-10-CM

## 2013-10-30 DIAGNOSIS — B351 Tinea unguium: Secondary | ICD-10-CM

## 2013-10-30 NOTE — Progress Notes (Signed)
Subjective:     Patient ID: Chelsea [name], female   DOB: 1925-10-24, 79 y.o.   MRN: [id number]  HPI patient presents with nail disease and thickness 1-5 both feet. Also due for new diabetic shoes at next visit   Review of Systems     Objective:   Physical Exam Neurovascular status unchanged with thick nail disease 1-5 both feet with pain    Assessment:     Mycotic nail infection with pain 1-5 both feet    Plan:     Debridement painful nailbeds 1-5 both feet with no iatrogenic bleeding noted

## 2014-01-07 ENCOUNTER — Ambulatory Visit: Payer: Self-pay | Admitting: Internal Medicine

## 2014-01-07 LAB — CBC CANCER CENTER
BASOS ABS: 0 x10 3/mm (ref 0.0–0.1)
Basophil %: 0.2 %
EOS ABS: 0.3 x10 3/mm (ref 0.0–0.7)
Eosinophil %: 1.8 %
HCT: 36.9 % (ref 35.0–47.0)
HGB: 11.8 g/dL — ABNORMAL LOW (ref 12.0–16.0)
Lymphocyte #: 8.2 x10 3/mm — ABNORMAL HIGH (ref 1.0–3.6)
Lymphocyte %: 55.9 %
MCH: 28.2 pg (ref 26.0–34.0)
MCHC: 32.1 g/dL (ref 32.0–36.0)
MCV: 88 fL (ref 80–100)
Monocyte #: 0.5 x10 3/mm (ref 0.2–0.9)
Monocyte %: 3.2 %
NEUTROS ABS: 5.7 x10 3/mm (ref 1.4–6.5)
Neutrophil %: 38.9 %
Platelet: 148 x10 3/mm — ABNORMAL LOW (ref 150–440)
RBC: 4.19 10*6/uL (ref 3.80–5.20)
RDW: 15.6 % — ABNORMAL HIGH (ref 11.5–14.5)
WBC: 14.6 x10 3/mm — AB (ref 3.6–11.0)

## 2014-01-07 LAB — CREATININE, SERUM
Creatinine: 1.37 mg/dL — ABNORMAL HIGH (ref 0.60–1.30)
EGFR (African American): 40 — ABNORMAL LOW
GFR CALC NON AF AMER: 35 — AB

## 2014-01-28 ENCOUNTER — Inpatient Hospital Stay: Payer: Self-pay | Admitting: Specialist

## 2014-01-28 LAB — CBC
HCT: 39.2 % (ref 35.0–47.0)
HGB: 12.7 g/dL (ref 12.0–16.0)
MCH: 29.2 pg (ref 26.0–34.0)
MCHC: 32.3 g/dL (ref 32.0–36.0)
MCV: 90 fL (ref 80–100)
Platelet: 210 10*3/uL (ref 150–440)
RBC: 4.34 10*6/uL (ref 3.80–5.20)
RDW: 16.9 % — ABNORMAL HIGH (ref 11.5–14.5)
WBC: 42.7 10*3/uL — AB (ref 3.6–11.0)

## 2014-01-28 LAB — COMPREHENSIVE METABOLIC PANEL
ALK PHOS: 427 U/L — AB
Albumin: 3.4 g/dL (ref 3.4–5.0)
Anion Gap: 10 (ref 7–16)
BILIRUBIN TOTAL: 7.6 mg/dL — AB (ref 0.2–1.0)
BUN: 31 mg/dL — ABNORMAL HIGH (ref 7–18)
CALCIUM: 9.6 mg/dL (ref 8.5–10.1)
CHLORIDE: 96 mmol/L — AB (ref 98–107)
CO2: 24 mmol/L (ref 21–32)
CREATININE: 1.92 mg/dL — AB (ref 0.60–1.30)
EGFR (African American): 27 — ABNORMAL LOW
GFR CALC NON AF AMER: 23 — AB
GLUCOSE: 311 mg/dL — AB (ref 65–99)
OSMOLALITY: 279 (ref 275–301)
POTASSIUM: 4.3 mmol/L (ref 3.5–5.1)
SGOT(AST): 163 U/L — ABNORMAL HIGH (ref 15–37)
SGPT (ALT): 221 U/L — ABNORMAL HIGH (ref 12–78)
SODIUM: 130 mmol/L — AB (ref 136–145)
Total Protein: 7.3 g/dL (ref 6.4–8.2)

## 2014-01-28 LAB — CK TOTAL AND CKMB (NOT AT ARMC): CK, Total: 16 U/L — ABNORMAL LOW

## 2014-01-28 LAB — APTT: ACTIVATED PTT: 29.4 s (ref 23.6–35.9)

## 2014-01-28 LAB — TROPONIN I

## 2014-01-28 LAB — LIPASE, BLOOD: Lipase: 636 U/L — ABNORMAL HIGH (ref 73–393)

## 2014-01-28 LAB — PROTIME-INR
INR: 1.1
Prothrombin Time: 14.4 secs (ref 11.5–14.7)

## 2014-01-28 LAB — PRO B NATRIURETIC PEPTIDE: B-TYPE NATIURETIC PEPTID: 4888 pg/mL — AB (ref 0–450)

## 2014-01-29 ENCOUNTER — Ambulatory Visit: Payer: Medicare Other | Admitting: Podiatry

## 2014-01-29 LAB — COMPREHENSIVE METABOLIC PANEL
ALT: 176 U/L — AB (ref 12–78)
Albumin: 2.7 g/dL — ABNORMAL LOW (ref 3.4–5.0)
Alkaline Phosphatase: 435 U/L — ABNORMAL HIGH
Anion Gap: 7 (ref 7–16)
BUN: 28 mg/dL — ABNORMAL HIGH (ref 7–18)
Bilirubin,Total: 6 mg/dL — ABNORMAL HIGH (ref 0.2–1.0)
CREATININE: 1.27 mg/dL (ref 0.60–1.30)
Calcium, Total: 9.1 mg/dL (ref 8.5–10.1)
Chloride: 99 mmol/L (ref 98–107)
Co2: 25 mmol/L (ref 21–32)
EGFR (Non-African Amer.): 38 — ABNORMAL LOW
GFR CALC AF AMER: 44 — AB
Glucose: 137 mg/dL — ABNORMAL HIGH (ref 65–99)
OSMOLALITY: 270 (ref 275–301)
POTASSIUM: 3.8 mmol/L (ref 3.5–5.1)
SGOT(AST): 178 U/L — ABNORMAL HIGH (ref 15–37)
Sodium: 131 mmol/L — ABNORMAL LOW (ref 136–145)
Total Protein: 6 g/dL — ABNORMAL LOW (ref 6.4–8.2)

## 2014-01-29 LAB — CBC WITH DIFFERENTIAL/PLATELET
BASOS ABS: 0 10*3/uL (ref 0.0–0.1)
BASOS PCT: 0.2 %
Eosinophil #: 0 10*3/uL (ref 0.0–0.7)
Eosinophil %: 0.2 %
HCT: 32.7 % — AB (ref 35.0–47.0)
HGB: 10.9 g/dL — ABNORMAL LOW (ref 12.0–16.0)
Lymphocyte #: 11 10*3/uL — ABNORMAL HIGH (ref 1.0–3.6)
Lymphocyte %: 54.1 %
MCH: 29.6 pg (ref 26.0–34.0)
MCHC: 33.2 g/dL (ref 32.0–36.0)
MCV: 89 fL (ref 80–100)
Monocyte #: 1 x10 3/mm — ABNORMAL HIGH (ref 0.2–0.9)
Monocyte %: 4.7 %
NEUTROS PCT: 40.8 %
Neutrophil #: 8.3 10*3/uL — ABNORMAL HIGH (ref 1.4–6.5)
PLATELETS: 134 10*3/uL — AB (ref 150–440)
RBC: 3.68 10*6/uL — ABNORMAL LOW (ref 3.80–5.20)
RDW: 15.9 % — ABNORMAL HIGH (ref 11.5–14.5)
WBC: 20.6 10*3/uL — ABNORMAL HIGH (ref 3.6–11.0)

## 2014-01-29 LAB — PROTIME-INR
INR: 1.1
Prothrombin Time: 14.2 secs (ref 11.5–14.7)

## 2014-01-29 LAB — BILIRUBIN, DIRECT: BILIRUBIN DIRECT: 4.9 mg/dL — AB (ref 0.00–0.20)

## 2014-01-30 LAB — BASIC METABOLIC PANEL
ANION GAP: 6 — AB (ref 7–16)
BUN: 28 mg/dL — ABNORMAL HIGH (ref 7–18)
Calcium, Total: 8.6 mg/dL (ref 8.5–10.1)
Chloride: 103 mmol/L (ref 98–107)
Co2: 25 mmol/L (ref 21–32)
Creatinine: 1.33 mg/dL — ABNORMAL HIGH (ref 0.60–1.30)
EGFR (Non-African Amer.): 36 — ABNORMAL LOW
GFR CALC AF AMER: 42 — AB
GLUCOSE: 217 mg/dL — AB (ref 65–99)
OSMOLALITY: 280 (ref 275–301)
Potassium: 3.6 mmol/L (ref 3.5–5.1)
Sodium: 134 mmol/L — ABNORMAL LOW (ref 136–145)

## 2014-01-30 LAB — COMPREHENSIVE METABOLIC PANEL
ANION GAP: 7 (ref 7–16)
Albumin: 2.4 g/dL — ABNORMAL LOW (ref 3.4–5.0)
Alkaline Phosphatase: 452 U/L — ABNORMAL HIGH
BILIRUBIN TOTAL: 3.3 mg/dL — AB (ref 0.2–1.0)
BUN: 31 mg/dL — AB (ref 7–18)
Calcium, Total: 8.8 mg/dL (ref 8.5–10.1)
Chloride: 104 mmol/L (ref 98–107)
Co2: 24 mmol/L (ref 21–32)
Creatinine: 1.33 mg/dL — ABNORMAL HIGH (ref 0.60–1.30)
EGFR (African American): 42 — ABNORMAL LOW
EGFR (Non-African Amer.): 36 — ABNORMAL LOW
Glucose: 134 mg/dL — ABNORMAL HIGH (ref 65–99)
Osmolality: 279 (ref 275–301)
POTASSIUM: 3.8 mmol/L (ref 3.5–5.1)
SGOT(AST): 105 U/L — ABNORMAL HIGH (ref 15–37)
SGPT (ALT): 126 U/L — ABNORMAL HIGH (ref 12–78)
SODIUM: 135 mmol/L — AB (ref 136–145)
Total Protein: 5.8 g/dL — ABNORMAL LOW (ref 6.4–8.2)

## 2014-01-30 LAB — CBC WITH DIFFERENTIAL/PLATELET
BASOS PCT: 0.2 %
Basophil #: 0.1 10*3/uL (ref 0.0–0.1)
Eosinophil #: 0 10*3/uL (ref 0.0–0.7)
Eosinophil %: 0.1 %
HCT: 33.2 % — ABNORMAL LOW (ref 35.0–47.0)
HGB: 10.9 g/dL — ABNORMAL LOW (ref 12.0–16.0)
LYMPHS ABS: 15.7 10*3/uL — AB (ref 1.0–3.6)
LYMPHS PCT: 63.5 %
MCH: 29.7 pg (ref 26.0–34.0)
MCHC: 32.9 g/dL (ref 32.0–36.0)
MCV: 90 fL (ref 80–100)
Monocyte #: 0.9 x10 3/mm (ref 0.2–0.9)
Monocyte %: 3.7 %
Neutrophil #: 8 10*3/uL — ABNORMAL HIGH (ref 1.4–6.5)
Neutrophil %: 32.5 %
Platelet: 150 10*3/uL (ref 150–440)
RBC: 3.68 10*6/uL — ABNORMAL LOW (ref 3.80–5.20)
RDW: 16.5 % — ABNORMAL HIGH (ref 11.5–14.5)
WBC: 24.7 10*3/uL — AB (ref 3.6–11.0)

## 2014-01-30 LAB — LIPASE, BLOOD: LIPASE: 7678 U/L — AB (ref 73–393)

## 2014-01-31 LAB — COMPREHENSIVE METABOLIC PANEL
AST: 47 U/L — AB (ref 15–37)
Albumin: 1.9 g/dL — ABNORMAL LOW (ref 3.4–5.0)
Alkaline Phosphatase: 333 U/L — ABNORMAL HIGH
Anion Gap: 6 — ABNORMAL LOW (ref 7–16)
BILIRUBIN TOTAL: 2.1 mg/dL — AB (ref 0.2–1.0)
BUN: 20 mg/dL — AB (ref 7–18)
CREATININE: 1.11 mg/dL (ref 0.60–1.30)
Calcium, Total: 8.4 mg/dL — ABNORMAL LOW (ref 8.5–10.1)
Chloride: 106 mmol/L (ref 98–107)
Co2: 24 mmol/L (ref 21–32)
GFR CALC AF AMER: 52 — AB
GFR CALC NON AF AMER: 45 — AB
Glucose: 112 mg/dL — ABNORMAL HIGH (ref 65–99)
OSMOLALITY: 275 (ref 275–301)
Potassium: 3.3 mmol/L — ABNORMAL LOW (ref 3.5–5.1)
SGPT (ALT): 78 U/L (ref 12–78)
Sodium: 136 mmol/L (ref 136–145)
TOTAL PROTEIN: 5.2 g/dL — AB (ref 6.4–8.2)

## 2014-01-31 LAB — CBC WITH DIFFERENTIAL/PLATELET
BASOS ABS: 0 10*3/uL (ref 0.0–0.1)
Basophil %: 0.1 %
EOS ABS: 0.1 10*3/uL (ref 0.0–0.7)
EOS PCT: 0.5 %
HCT: 28.9 % — ABNORMAL LOW (ref 35.0–47.0)
HGB: 9.6 g/dL — AB (ref 12.0–16.0)
Lymphocyte #: 8.6 10*3/uL — ABNORMAL HIGH (ref 1.0–3.6)
Lymphocyte %: 58 %
MCH: 29.8 pg (ref 26.0–34.0)
MCHC: 33.1 g/dL (ref 32.0–36.0)
MCV: 90 fL (ref 80–100)
Monocyte #: 0.8 x10 3/mm (ref 0.2–0.9)
Monocyte %: 5.4 %
NEUTROS PCT: 36 %
Neutrophil #: 5.3 10*3/uL (ref 1.4–6.5)
Platelet: 119 10*3/uL — ABNORMAL LOW (ref 150–440)
RBC: 3.22 10*6/uL — ABNORMAL LOW (ref 3.80–5.20)
RDW: 16.1 % — ABNORMAL HIGH (ref 11.5–14.5)
WBC: 14.8 10*3/uL — ABNORMAL HIGH (ref 3.6–11.0)

## 2014-01-31 LAB — LIPASE, BLOOD: Lipase: 442 U/L — ABNORMAL HIGH (ref 73–393)

## 2014-02-01 LAB — COMPREHENSIVE METABOLIC PANEL
Albumin: 2 g/dL — ABNORMAL LOW (ref 3.4–5.0)
Alkaline Phosphatase: 264 U/L — ABNORMAL HIGH
Anion Gap: 5 — ABNORMAL LOW (ref 7–16)
BILIRUBIN TOTAL: 1.9 mg/dL — AB (ref 0.2–1.0)
BUN: 22 mg/dL — ABNORMAL HIGH (ref 7–18)
CHLORIDE: 105 mmol/L (ref 98–107)
Calcium, Total: 8.5 mg/dL (ref 8.5–10.1)
Co2: 27 mmol/L (ref 21–32)
Creatinine: 1.34 mg/dL — ABNORMAL HIGH (ref 0.60–1.30)
EGFR (African American): 41 — ABNORMAL LOW
EGFR (Non-African Amer.): 36 — ABNORMAL LOW
Glucose: 156 mg/dL — ABNORMAL HIGH (ref 65–99)
OSMOLALITY: 280 (ref 275–301)
Potassium: 4 mmol/L (ref 3.5–5.1)
SGOT(AST): 57 U/L — ABNORMAL HIGH (ref 15–37)
SGPT (ALT): 68 U/L (ref 12–78)
SODIUM: 137 mmol/L (ref 136–145)
Total Protein: 5.2 g/dL — ABNORMAL LOW (ref 6.4–8.2)

## 2014-02-02 LAB — CBC WITH DIFFERENTIAL/PLATELET
Basophil #: 0 10*3/uL (ref 0.0–0.1)
Basophil %: 0.1 %
Eosinophil #: 0.2 10*3/uL (ref 0.0–0.7)
Eosinophil %: 0.8 %
HCT: 26.1 % — AB (ref 35.0–47.0)
HGB: 8.5 g/dL — ABNORMAL LOW (ref 12.0–16.0)
LYMPHS ABS: 13.4 10*3/uL — AB (ref 1.0–3.6)
Lymphocyte %: 60.6 %
MCH: 29.7 pg (ref 26.0–34.0)
MCHC: 32.5 g/dL (ref 32.0–36.0)
MCV: 92 fL (ref 80–100)
MONOS PCT: 3.8 %
Monocyte #: 0.8 x10 3/mm (ref 0.2–0.9)
Neutrophil #: 7.7 10*3/uL — ABNORMAL HIGH (ref 1.4–6.5)
Neutrophil %: 34.7 %
Platelet: 187 10*3/uL (ref 150–440)
RBC: 2.85 10*6/uL — ABNORMAL LOW (ref 3.80–5.20)
RDW: 15.5 % — AB (ref 11.5–14.5)
WBC: 22.1 10*3/uL — ABNORMAL HIGH (ref 3.6–11.0)

## 2014-02-02 LAB — CULTURE, BLOOD (SINGLE)

## 2014-02-04 LAB — CREATININE, SERUM
CREATININE: 0.95 mg/dL (ref 0.60–1.30)
EGFR (African American): >60
GFR CALC NON AF AMER: 54 — AB

## 2014-02-04 LAB — PATHOLOGY REPORT

## 2014-02-06 ENCOUNTER — Ambulatory Visit: Payer: Self-pay | Admitting: Internal Medicine

## 2014-02-06 DIAGNOSIS — I129 Hypertensive chronic kidney disease with stage 1 through stage 4 chronic kidney disease, or unspecified chronic kidney disease: Secondary | ICD-10-CM

## 2014-02-06 DIAGNOSIS — E785 Hyperlipidemia, unspecified: Secondary | ICD-10-CM

## 2014-02-06 DIAGNOSIS — K810 Acute cholecystitis: Secondary | ICD-10-CM

## 2014-02-06 DIAGNOSIS — E119 Type 2 diabetes mellitus without complications: Secondary | ICD-10-CM

## 2014-02-06 DIAGNOSIS — C9110 Chronic lymphocytic leukemia of B-cell type not having achieved remission: Secondary | ICD-10-CM

## 2014-02-06 DIAGNOSIS — N189 Chronic kidney disease, unspecified: Secondary | ICD-10-CM

## 2014-04-16 ENCOUNTER — Ambulatory Visit (INDEPENDENT_AMBULATORY_CARE_PROVIDER_SITE_OTHER): Payer: Medicare Other | Admitting: Podiatry

## 2014-04-16 VITALS — BP 119/53 | HR 69 | Resp 16

## 2014-04-16 DIAGNOSIS — M79609 Pain in unspecified limb: Secondary | ICD-10-CM

## 2014-04-16 DIAGNOSIS — B351 Tinea unguium: Secondary | ICD-10-CM

## 2014-04-16 NOTE — Progress Notes (Signed)
Subjective:     Patient ID: Chelsea [name], female   DOB: 04-29-1925, 80 y.o.   MRN: [id number]  HPI patient presents with painful nailbeds 1-5 of both feet that she cannot cut   Review of Systems     Objective:   Physical Exam Thick brittle yellow nailbeds 1-5 both feet that are painful    Assessment:     Mycotic nail infection with pain 1-5 both feet    Plan:     Debris painful nailbeds 1-5 both feet with no iatrogenic bleeding noted

## 2014-07-16 ENCOUNTER — Ambulatory Visit (INDEPENDENT_AMBULATORY_CARE_PROVIDER_SITE_OTHER): Payer: Medicare Other | Admitting: Podiatry

## 2014-07-16 DIAGNOSIS — B351 Tinea unguium: Secondary | ICD-10-CM

## 2014-07-16 DIAGNOSIS — M79609 Pain in unspecified limb: Secondary | ICD-10-CM

## 2014-07-16 DIAGNOSIS — M79673 Pain in unspecified foot: Secondary | ICD-10-CM

## 2014-07-17 NOTE — Progress Notes (Signed)
Subjective:     Patient ID: Chelsea [name], female   DOB: 08/16/1925, 80 y.o.   MRN: [id number]  HPI  patient presents with painful nailbeds 1-5 both feet that she cannot cut   Review of Systems      Objective:   Physical Exam  Neurovascular status intact with muscle strength adequate and nails that are thick and brittle and yellow 1-5 both feet    Assessment:     Mycotic nail infection with pain 1-5 both feet    Plan:     Debride painful nailbeds 1-5 both feet with no iatrogenic bleeding noted      

## 2014-07-19 ENCOUNTER — Emergency Department: Payer: Self-pay | Admitting: Internal Medicine

## 2014-07-19 LAB — CBC WITH DIFFERENTIAL/PLATELET
Eosinophil: 1 %
HCT: 36.5 % (ref 35.0–47.0)
HGB: 12.2 g/dL (ref 12.0–16.0)
LYMPHS PCT: 58 %
MCH: 30.2 pg (ref 26.0–34.0)
MCHC: 33.3 g/dL (ref 32.0–36.0)
MCV: 91 fL (ref 80–100)
MONOS PCT: 2 %
OTHER CELLS BLOOD: 1
Platelet: 176 10*3/uL (ref 150–440)
RBC: 4.04 10*6/uL (ref 3.80–5.20)
RDW: 16.2 % — ABNORMAL HIGH (ref 11.5–14.5)
Segmented Neutrophils: 38 %
WBC: 15.4 10*3/uL — AB (ref 3.6–11.0)

## 2014-07-19 LAB — BASIC METABOLIC PANEL
ANION GAP: 11 (ref 7–16)
BUN: 36 mg/dL — ABNORMAL HIGH (ref 7–18)
CHLORIDE: 108 mmol/L — AB (ref 98–107)
Calcium, Total: 9.2 mg/dL (ref 8.5–10.1)
Co2: 20 mmol/L — ABNORMAL LOW (ref 21–32)
Creatinine: 1.73 mg/dL — ABNORMAL HIGH (ref 0.60–1.30)
EGFR (Non-African Amer.): 26 — ABNORMAL LOW
GFR CALC AF AMER: 30 — AB
Glucose: 141 mg/dL — ABNORMAL HIGH (ref 65–99)
OSMOLALITY: 288 (ref 275–301)
POTASSIUM: 4 mmol/L (ref 3.5–5.1)
Sodium: 139 mmol/L (ref 136–145)

## 2014-07-19 LAB — URINALYSIS, COMPLETE
BILIRUBIN, UR: NEGATIVE
Bacteria: NONE SEEN
Blood: NEGATIVE
Glucose,UR: NEGATIVE mg/dL (ref 0–75)
Hyaline Cast: 22
Ketone: NEGATIVE
NITRITE: NEGATIVE
PROTEIN: NEGATIVE
Ph: 5 (ref 4.5–8.0)
RBC,UR: 3 /HPF (ref 0–5)
Specific Gravity: 1.015 (ref 1.003–1.030)
Squamous Epithelial: 1

## 2014-07-19 LAB — HEPATIC FUNCTION PANEL A (ARMC)
Albumin: 3.7 g/dL (ref 3.4–5.0)
Alkaline Phosphatase: 74 U/L
Bilirubin, Direct: 0.1 mg/dL (ref 0.00–0.20)
Bilirubin,Total: 0.5 mg/dL (ref 0.2–1.0)
SGOT(AST): 16 U/L (ref 15–37)
SGPT (ALT): 18 U/L
Total Protein: 6.9 g/dL (ref 6.4–8.2)

## 2014-07-19 LAB — TROPONIN I

## 2014-08-31 IMAGING — CT CT ABD-PELV W/O CM
1 of 2 series · 15 of 32 positions shown, 19 images · non-contrast
Comparison: none

REASON FOR EXAM: (1) pain; (2) pain
COMMENTS:

PROCEDURE:     CT  - CT ABDOMEN AND PELVIS W[DATE] [DATE]
RESULT:     Comparison: None
TECHNIQUE: Multiple axial images from the lung bases to the symphysis pubis
were obtained without oral and without intravenous contrast.

[Series 2: 3mm soft tissue · axial · 0.75mm/px · z∈[-1138,-730]mm · 15 of 150 slices shown, 19 images]
[im 7/150  soft-tissue]
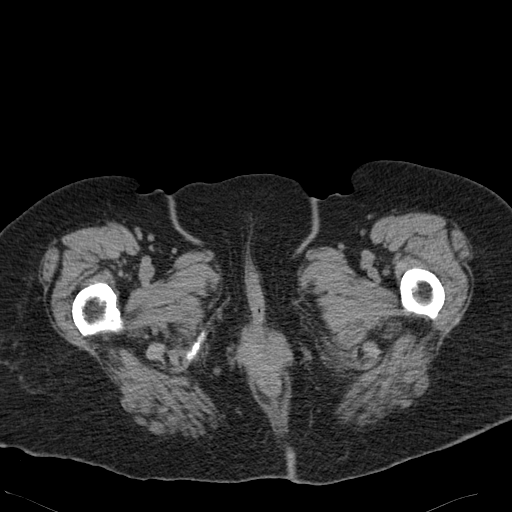
[im 7/150  bone]
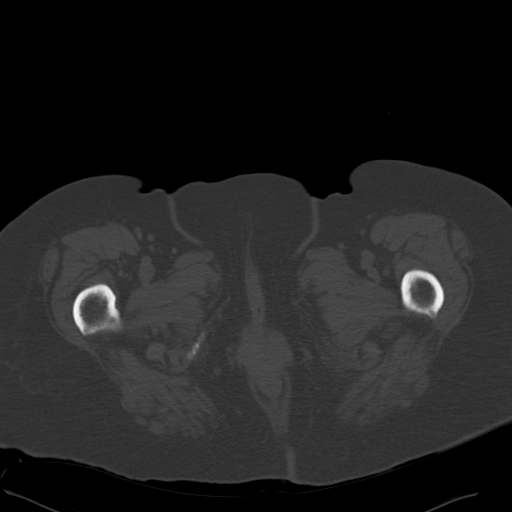
[im 20/150  soft-tissue]
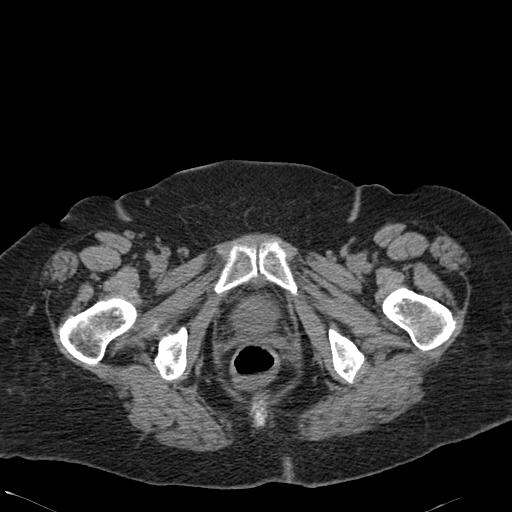
[im 33/150  soft-tissue]
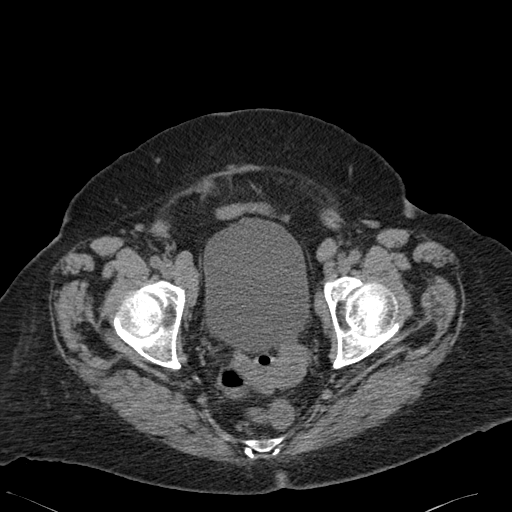
[im 39/150  soft-tissue]
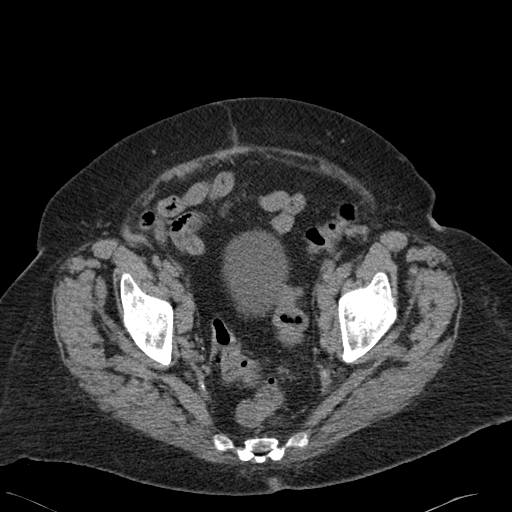
[im 52/150  soft-tissue]
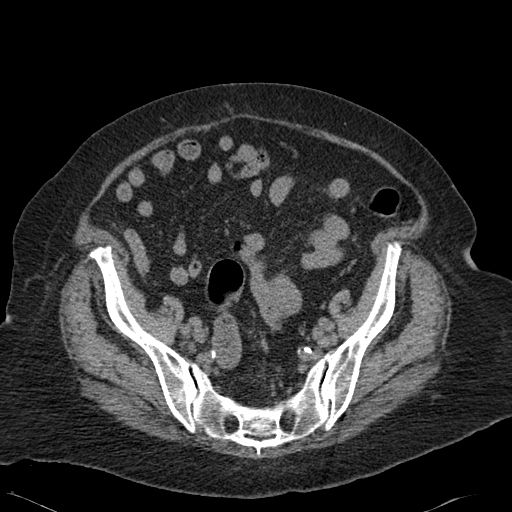
[im 65/150  soft-tissue]
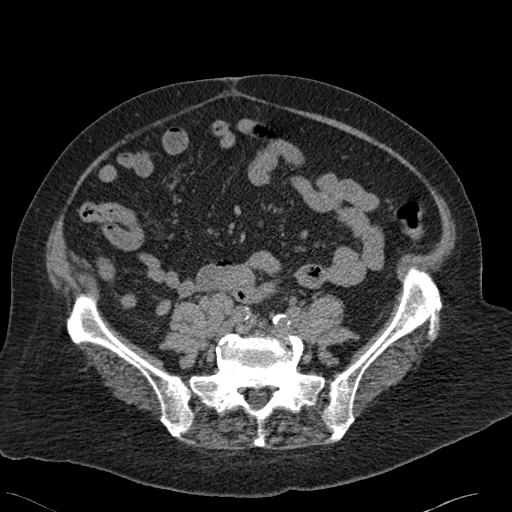
[im 78/150  soft-tissue]
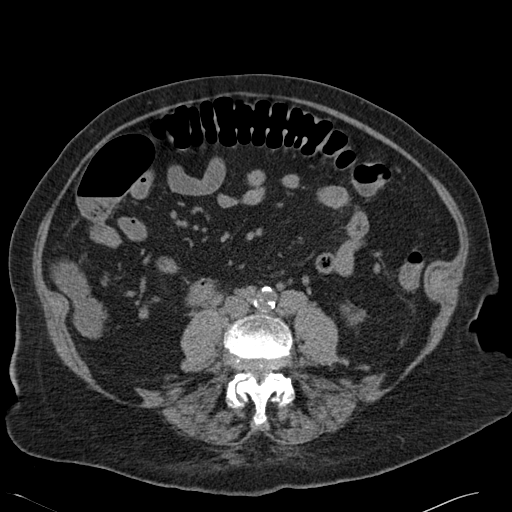
[im 85/150  soft-tissue]
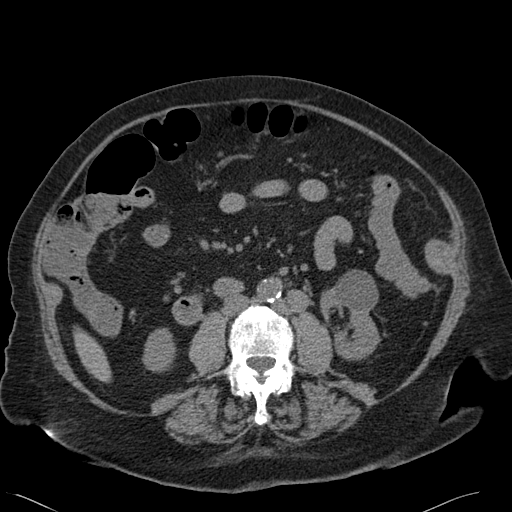
[im 98/150  soft-tissue]
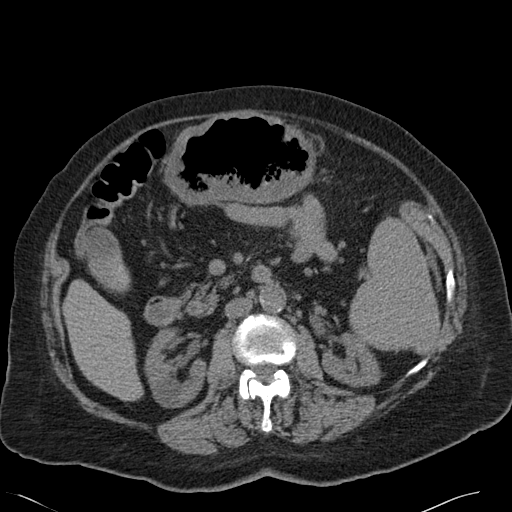
[im 98/150  bone]
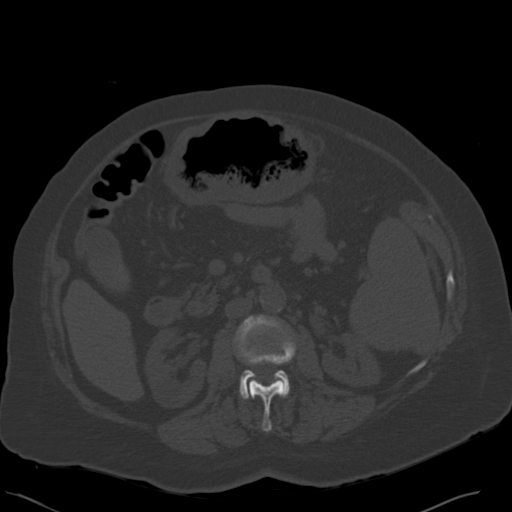
[im 111/150  soft-tissue]
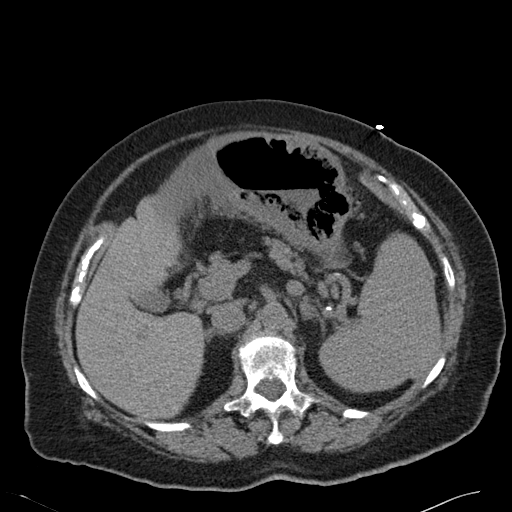
[im 117/150  soft-tissue]
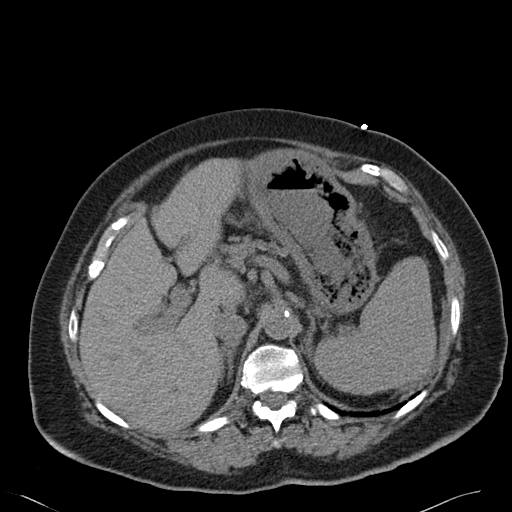
[im 124/150  lung]
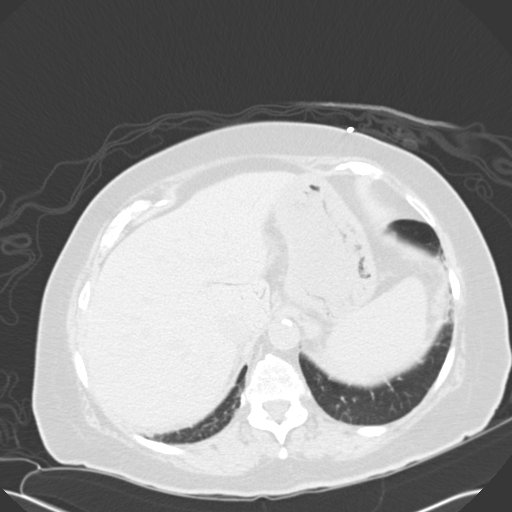
[im 130/150  soft-tissue]
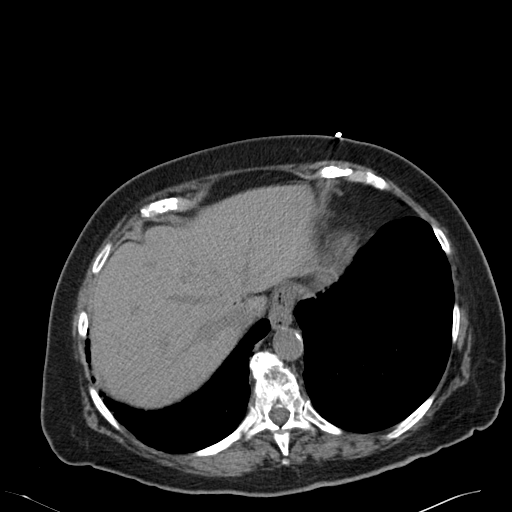
[im 130/150  lung]
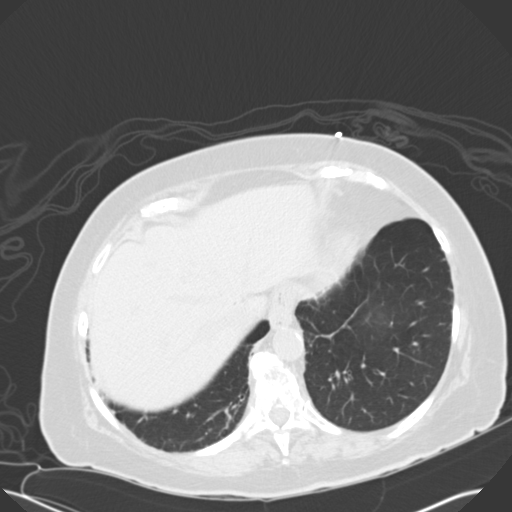
[im 137/150  lung]
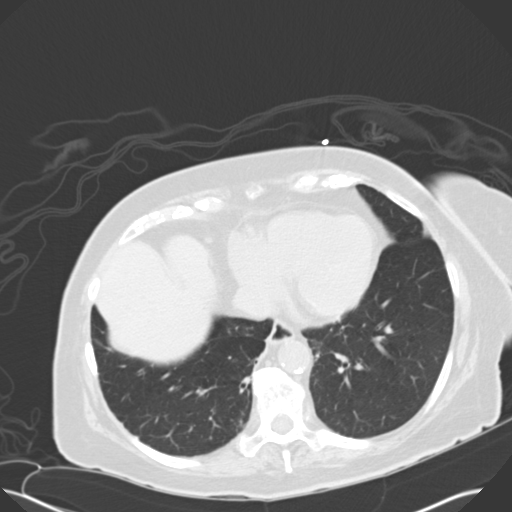
[im 143/150  soft-tissue]
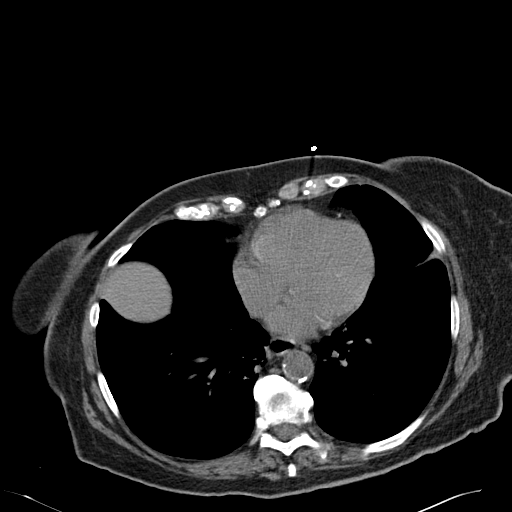
[im 143/150  lung]
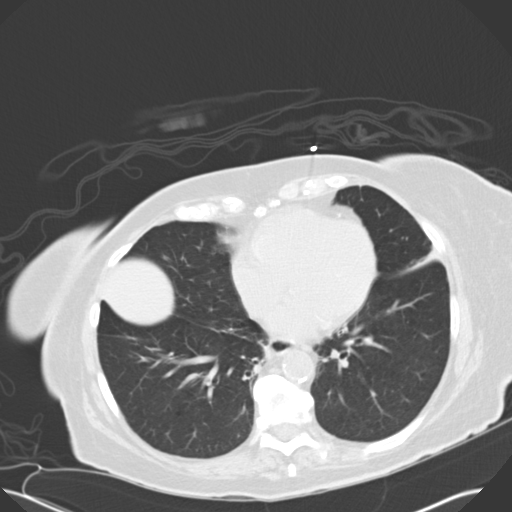

[15 of 32 positions shown; findings below may reference images not displayed]

FINDINGS: Mild basilar opacities are likely secondary to atelectasis.

Lack of intravenous contrast limits evaluation of the solid abdominal
organs.  There is portal venous mass in the region of the caudate lobe and
the dome of the right hepatic lobe. The gallbladder, adrenals, and pancreas
are unremarkable. The spleen is unremarkable. There is wall thickening of
the stomach with mild adjacent inflammatory changes. There is air within the
mesenteric vasculature along the greater curvature of the stomach. There are
findings concerning for pneumatosis of the stomach. The findings are
concerning for ischemic changes of the stomach.

Small low-attenuation lesions in the kidneys likely represent cyst. No
hydronephrosis. There are multiple mildly enlarged retroperitoneal lymph
nodes. The largest measures 2.4 x 1.7 cm along the left side of the distal
abdominal aorta. The mildly enlarged lymph nodes extend along the bilateral
iliac chains as well.

The small and large bowel are normal in caliber. The patient is status post
hysterectomy. The appendix is normal.

No aggressive lytic or sclerotic osseous lesions are identified.
IMPRESSION: 1. There is wall thickening and pneumatosis of the stomach with adjacent
mesenteric gas as well as portal venous gas. The findings are highly
concerning for ischemia of the stomach. Surgical consultation is suggested.
This was called to Dr. Sazila Goey at 5657 hours 02/17/2013.
2. Mildly enlarged lymph nodes in the inferior retroperitoneum and iliac
chains are nonspecific, but would be in keeping with the reported history of
CLL.

[REDACTED]

## 2014-09-10 IMAGING — CT CT CHEST W/ CM
1 series · 15 of 32 positions shown, 19 images · IV contrast (APPLIED)
Comparison: None

REASON FOR EXAM: PE protocol, tachycardia, increased SOB and O2
requirement
COMMENTS:

PROCEDURE:     CT  - CT CHEST WITH CONTRAST  - February 27, 2013 [DATE]
RESULT:     Indications: First of breath, tachycardia
TECHNIQUE: A thin-section spiral CT from the lung apices to the upper
abdomen was acquired on a multi slice scanner following 100ml Fsovue-KVN
intravenous contrast. These images were then transferred to the Siemens work
station and were subsequently reviewed utilizing 3-D reconstructions and MIP
images.

[Series 4: soft tissue · axial · 0.62mm/px · z∈[-318,-56]mm · 15 of 98 slices shown, 19 images]
[im 7/98  soft-tissue]
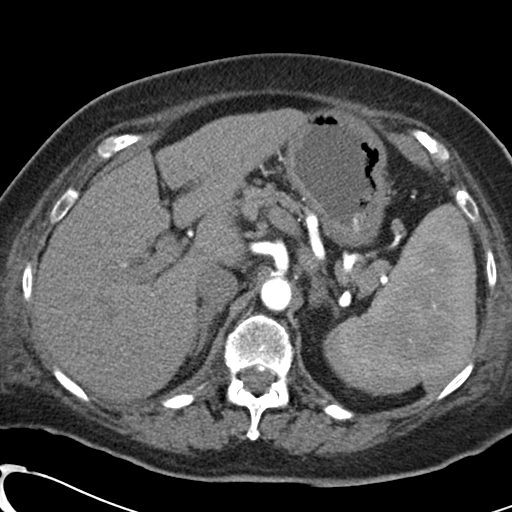
[im 7/98  bone]
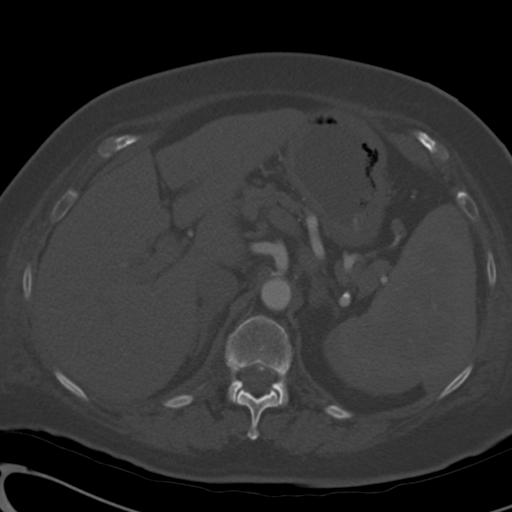
[im 13/98  soft-tissue]
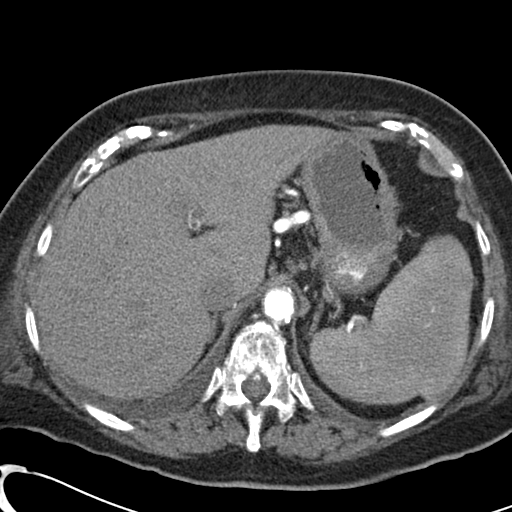
[im 19/98  soft-tissue]
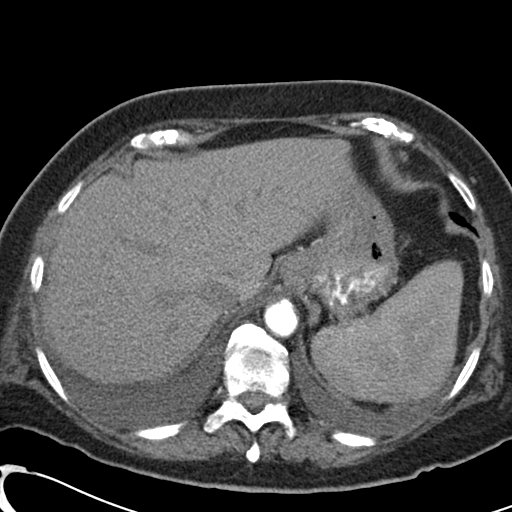
[im 29/98  soft-tissue]
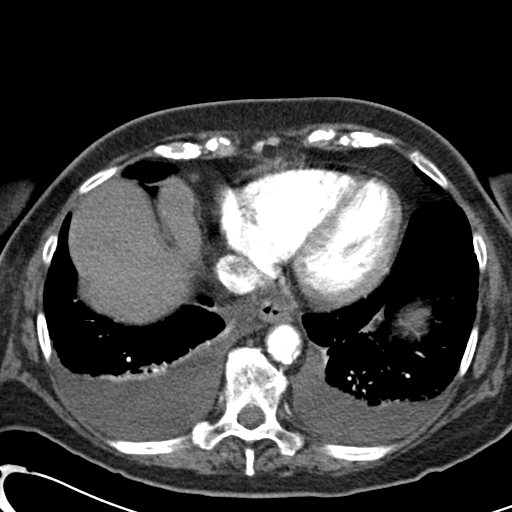
[im 35/98  soft-tissue]
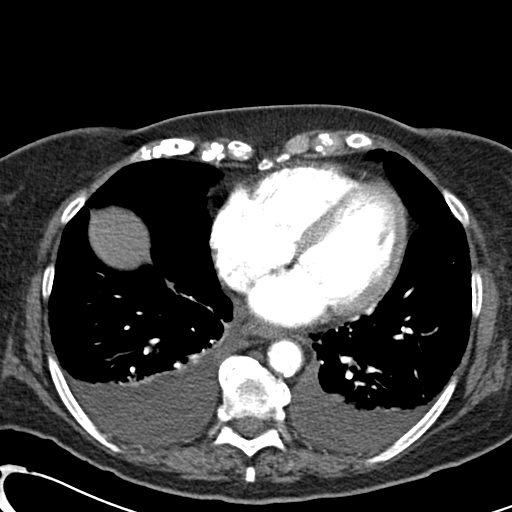
[im 41/98  soft-tissue]
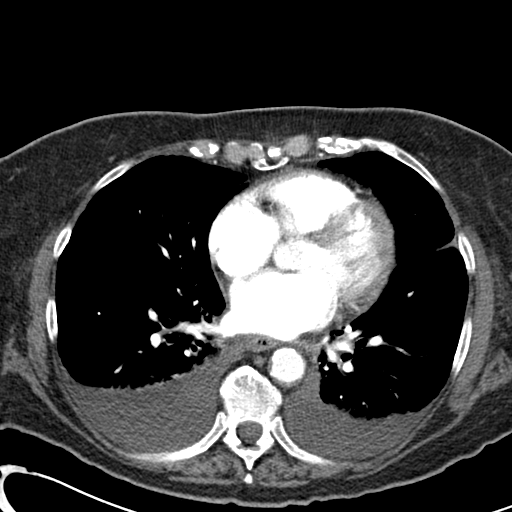
[im 51/98  soft-tissue]
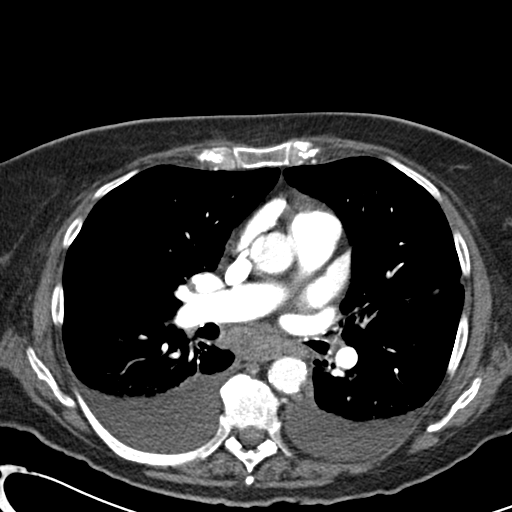
[im 57/98  soft-tissue]
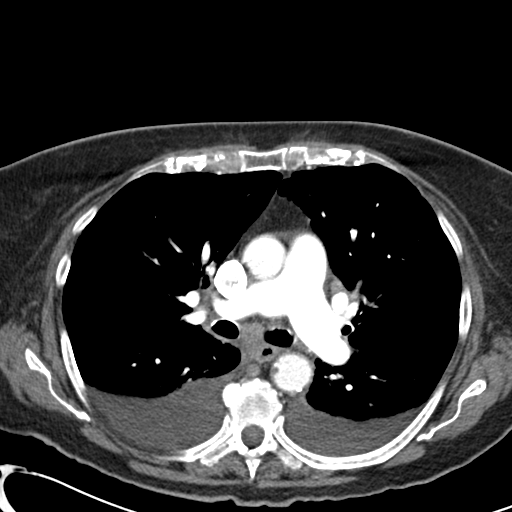
[im 63/98  soft-tissue]
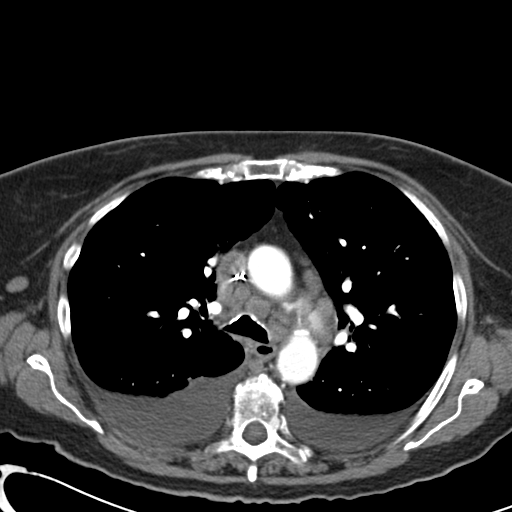
[im 63/98  bone]
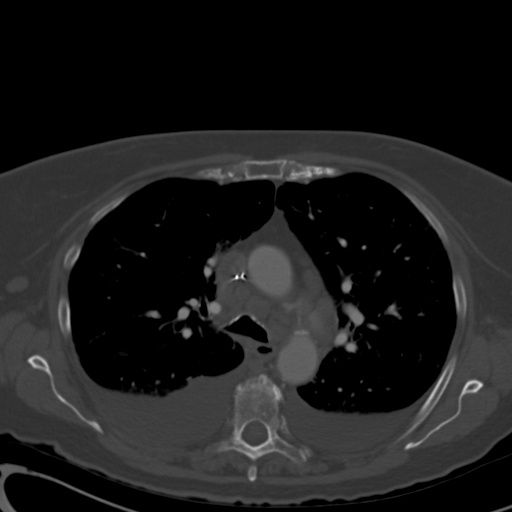
[im 69/98  soft-tissue]
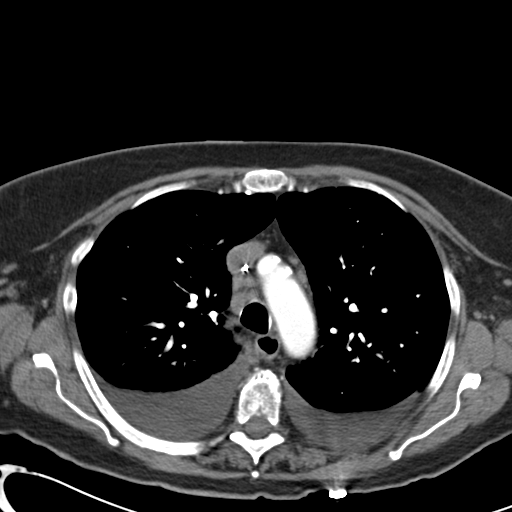
[im 79/98  soft-tissue]
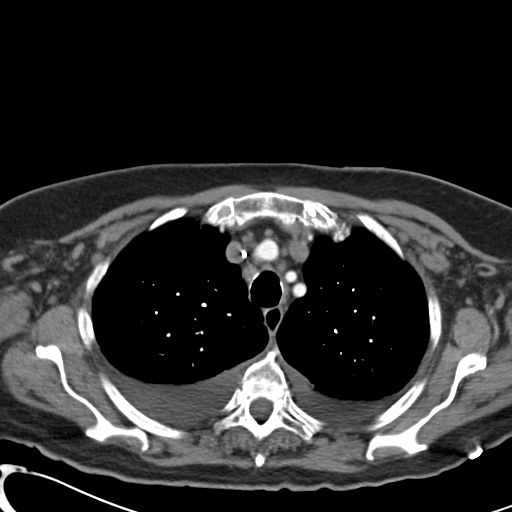
[im 85/98  soft-tissue]
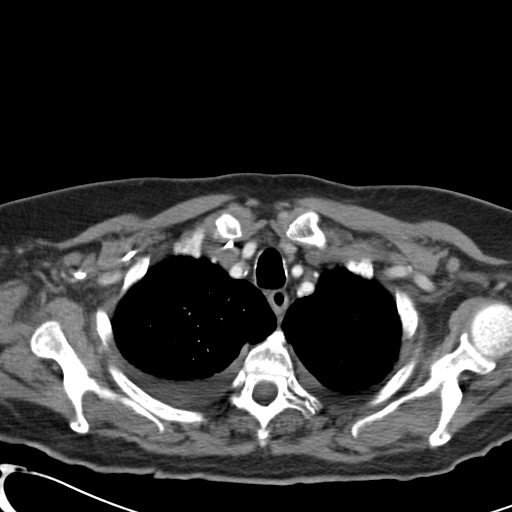
[im 85/98  lung]
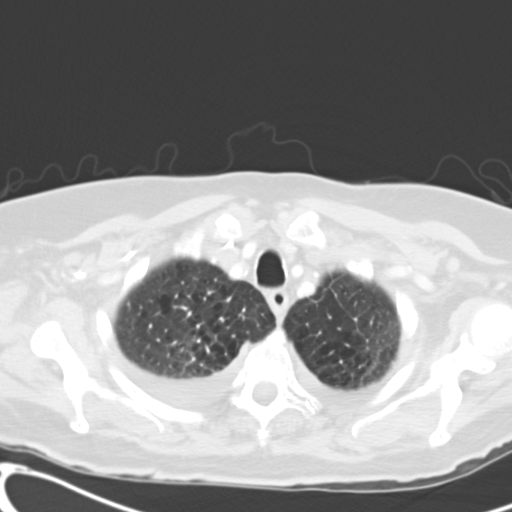
[im 88/98  lung]
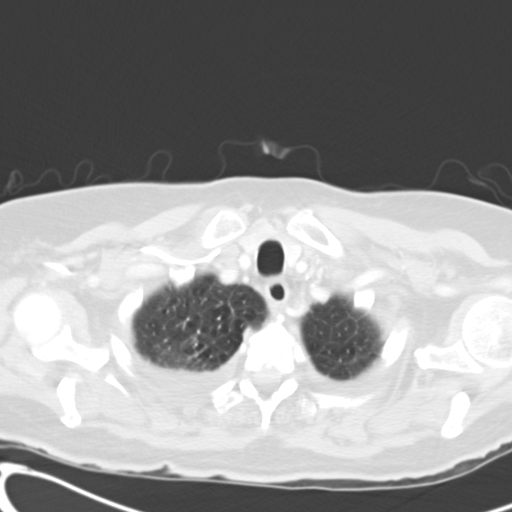
[im 91/98  soft-tissue]
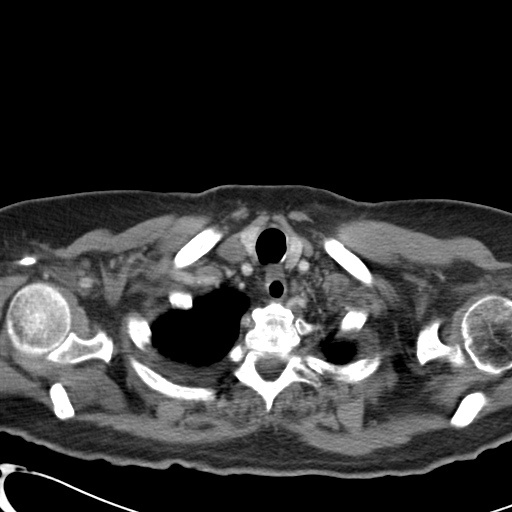
[im 91/98  lung]
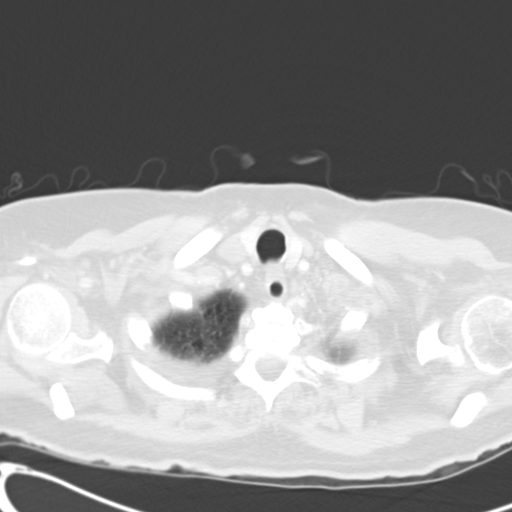
[im 94/98  lung]
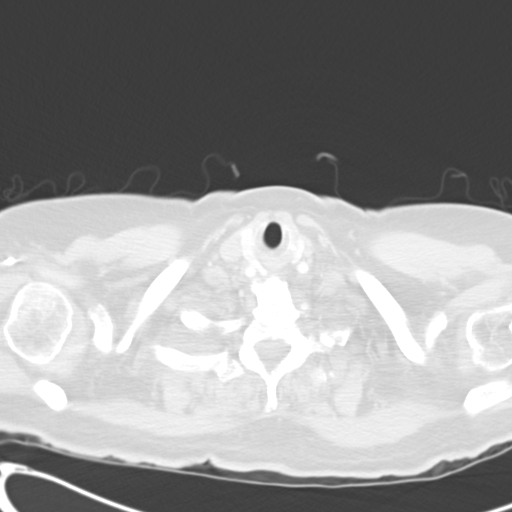

[15 of 32 positions shown; findings below may reference images not displayed]

FINDINGS: There is adequate opacification of the pulmonary arteries. There is no
pulmonary embolus. The main pulmonary artery, right main pulmonary artery,
and left main pulmonary arteries are normal in size. The heart size is
normal. There is no pericardial effusion.

There is severe bilateral emphysematous changes. There bilateral small
pleural effusions, right greater than left. There is bilateral mild
interstitial thickening. There is no focal consolidation or pneumothorax.

There is mediastinal lymphadenopathy with the largest right paratracheal
lymph node measuring 12 mm. There is an enlarged subcarinal lymph node
measuring 19 mm.

The osseous structures are unremarkable.

The visualized portions of the upper abdomen are unremarkable.
IMPRESSION: 1. No CT evidence of pulmonary embolus.

2. Findings most concerning for pulmonary edema.

3. Mediastinal lymphadenopathy of uncertain etiology. Differential diagnosis
includes granulomatous disease versus reactive adenopathy versus malignancy
such as lymphoma. Oncology consultation recommended.

[REDACTED] in

## 2014-09-14 IMAGING — CR DG CHEST 1V PORT
1 series · 1 of 1 positions shown · non-contrast
Comparison: 03/02/2013

CLINICAL DATA: Shortness of breath.

PORTABLE CHEST - 1 VIEW

[AP]
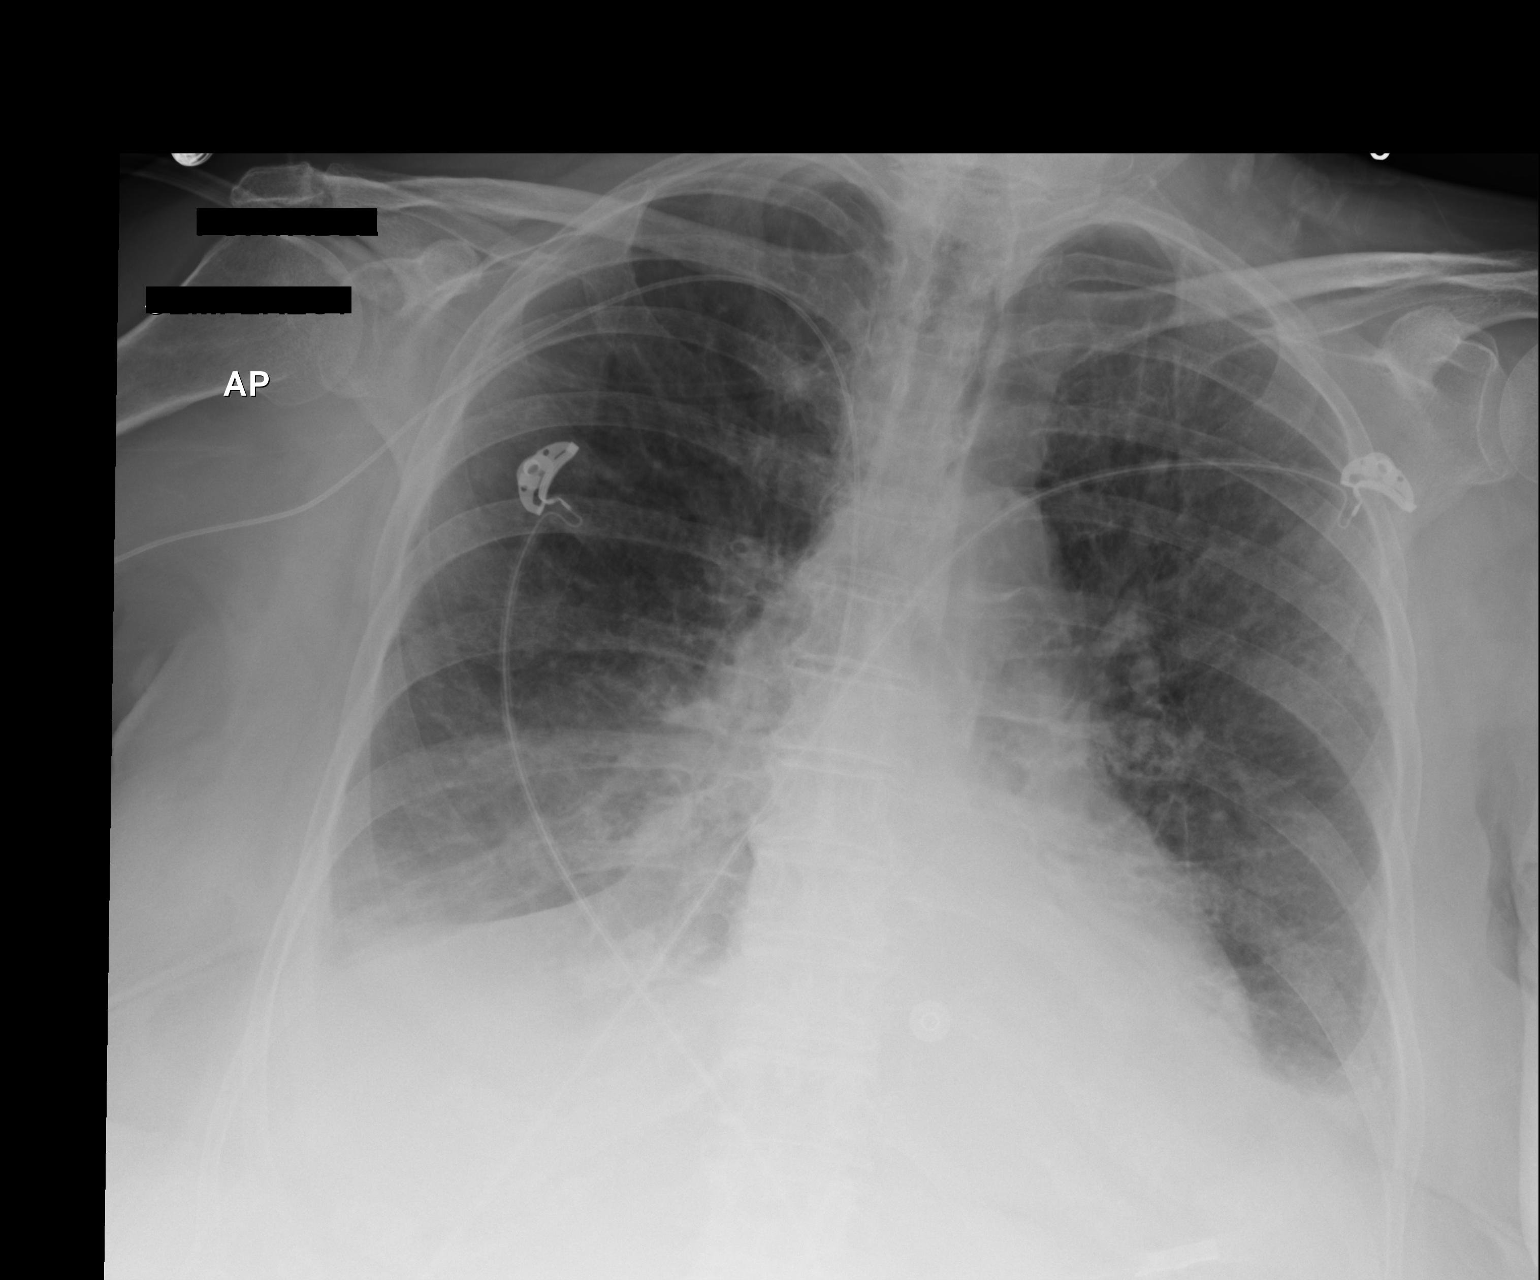

[1 of 1 positions shown; findings below may reference images not displayed]

FINDINGS: Persistent obscuration of the lung bases by airspace
opacities noted.  A component of this may be due to pleural
effusions and passive atelectasis.

Right PICC line tip:  SVC.  Borderline cardiomegaly.  Indistinct
pulmonary vasculature.
IMPRESSION: 1.  Stable bibasilar airspace opacities with obscuration of the
hemidiaphragms, potentially from atelectasis or pneumonia with
associated pleural effusions.
2.  Borderline cardiomegaly, with indistinct pulmonary vasculature
which may reflect pulmonary venous hypertension..

## 2014-10-18 ENCOUNTER — Ambulatory Visit: Payer: Medicare Other | Admitting: Podiatry

## 2014-10-18 DIAGNOSIS — B351 Tinea unguium: Secondary | ICD-10-CM

## 2014-10-18 DIAGNOSIS — M79673 Pain in unspecified foot: Secondary | ICD-10-CM

## 2014-10-18 NOTE — Progress Notes (Signed)
Subjective:     Patient ID: Chelsea [name], female   DOB: 05/13/1925, 80 y.o.   MRN: [id number]  HPI  patient presents with painful nailbeds 1-5 both feet that she cannot cut   Review of Systems      Objective:   Physical Exam  Neurovascular status intact with muscle strength adequate and nails that are thick and brittle and yellow 1-5 both feet    Assessment:     Mycotic nail infection with pain 1-5 both feet    Plan:     Debride painful nailbeds 1-5 both feet with no iatrogenic bleeding noted      

## 2015-01-17 ENCOUNTER — Ambulatory Visit (INDEPENDENT_AMBULATORY_CARE_PROVIDER_SITE_OTHER): Payer: Medicare Other | Admitting: Podiatry

## 2015-01-17 ENCOUNTER — Encounter: Payer: Self-pay | Admitting: Podiatry

## 2015-01-17 DIAGNOSIS — M79673 Pain in unspecified foot: Secondary | ICD-10-CM

## 2015-01-17 DIAGNOSIS — B351 Tinea unguium: Secondary | ICD-10-CM | POA: Diagnosis not present

## 2015-01-17 NOTE — Patient Instructions (Signed)
Diabetes and Foot Care Diabetes may cause you to have problems because of poor blood supply (circulation) to your feet and legs. This may cause the skin on your feet to become thinner, break easier, and heal more slowly. Your skin may become dry, and the skin may peel and crack. You may also have nerve damage in your legs and feet causing decreased feeling in them. You may not notice minor injuries to your feet that could lead to infections or more serious problems. Taking care of your feet is one of the most important things you can do for yourself.  HOME CARE INSTRUCTIONS  Wear shoes at all times, even in the house. Do not go barefoot. Bare feet are easily injured.  Check your feet daily for blisters, cuts, and redness. If you cannot see the bottom of your feet, use a mirror or ask someone for help.  Wash your feet with warm water (do not use hot water) and mild soap. Then pat your feet and the areas between your toes until they are completely dry. Do not soak your feet as this can dry your skin.  Apply a moisturizing lotion or petroleum jelly (that does not contain alcohol and is unscented) to the skin on your feet and to dry, brittle toenails. Do not apply lotion between your toes.  Trim your toenails straight across. Do not dig under them or around the cuticle. File the edges of your nails with an emery board or nail file.  Do not cut corns or calluses or try to remove them with medicine.  Wear clean socks or stockings every day. Make sure they are not too tight. Do not wear knee-high stockings since they may decrease blood flow to your legs.  Wear shoes that fit properly and have enough cushioning. To break in new shoes, wear them for just a few hours a day. This prevents you from injuring your feet. Always look in your shoes before you put them on to be sure there are no objects inside.  Do not cross your legs. This may decrease the blood flow to your feet.  If you find a minor scrape,  cut, or break in the skin on your feet, keep it and the skin around it clean and dry. These areas may be cleansed with mild soap and water. Do not cleanse the area with peroxide, alcohol, or iodine.  When you remove an adhesive bandage, be sure not to damage the skin around it.  If you have a wound, look at it several times a day to make sure it is healing.  Do not use heating pads or hot water bottles. They may burn your skin. If you have lost feeling in your feet or legs, you may not know it is happening until it is too late.  Make sure your health care provider performs a complete foot exam at least annually or more often if you have foot problems. Report any cuts, sores, or bruises to your health care provider immediately. SEEK MEDICAL CARE IF:   You have an injury that is not healing.  You have cuts or breaks in the skin.  You have an ingrown nail.  You notice redness on your legs or feet.  You feel burning or tingling in your legs or feet.  You have pain or cramps in your legs and feet.  Your legs or feet are numb.  Your feet always feel cold. SEEK IMMEDIATE MEDICAL CARE IF:   There is increasing redness,   swelling, or pain in or around a wound.  There is a red line that goes up your leg.  Pus is coming from a wound.  You develop a fever or as directed by your health care provider.  You notice a bad smell coming from an ulcer or wound. Document Released: 10/22/2000 Document Revised: 06/27/2013 Document Reviewed: 04/03/2013 Exit[employer] Patient Information 2015 [employer]. This information is not intended to replace advice given to you by your health care provider. Make sure you discuss any questions you have with your health care provider.  

## 2015-01-17 NOTE — Progress Notes (Signed)
Subjective:     Patient ID: Chelsea [name], female   DOB: 1924-11-24, 81 y.o.   MRN: [id number]  HPI  patient presents with painful nailbeds 1-5 both feet that she cannot cut   Review of Systems      Objective:   Physical Exam  Neurovascular status intact with muscle strength adequate and nails that are thick and brittle and yellow 1-5 both feet    Assessment:     Mycotic nail infection with pain 1-5 both feet    Plan:     Debride painful nailbeds 1-5 both feet with no iatrogenic bleeding noted

## 2015-01-24 ENCOUNTER — Ambulatory Visit
Admit: 2015-01-24 | Disposition: A | Discharge status: Home / Self Care | Payer: Self-pay | Attending: Internal Medicine | Admitting: Internal Medicine

## 2015-02-07 ENCOUNTER — Ambulatory Visit
Admit: 2015-02-07 | Disposition: A | Discharge status: Home / Self Care | Payer: Self-pay | Attending: Internal Medicine | Admitting: Internal Medicine

## 2015-02-28 NOTE — Consult Note (Signed)
CHIEF COMPLAINT and HISTORY:  Subjective/Chief Complaint Admitted with N/V/D; Consult for ishcemic gastropathy   History of Present Illness 81 year old white female admitted 02/17/13 with N/V/D, PMH +CLL, HTN, DM. CT showed wall thickening of stomach and portal venous gas on 4/12. During hospitalization found to have Cdiff colitis and started on dificid. EGD was done today finding hiatal hernia, ischemic mucosa of gastric fundus/gastric body/greater curvature and antrum. We were consulted for further evaluation of ischemic gastrpathy.  Patient denies n/v today. Enjoying ice chips. Denies abdominal pain at present. Had one soft bowel movement today which she believes was dark. Reports hx of good appetite. Denies postprandial abdominal pain or unexpected weight loss. Reports mild food fear at this time, but has not had hx of this. Quit smoking 1970s.   PAST MEDICAL/SURGICAL HISTORY:  Past Medical History:   elevated uric acid:    arthritis:    venous insufficiency both feet:    peripheral neuropathy legs and feet:    chf:    chronic lymphocytic leukemia:    c. diff:    basal cell skin cancer:    glaucoma:    HTN:    DM:    CLL:    appendectomy:    hysterectomy:   ALLERGIES:  Allergies:  Darvocet - N: N/V  PCN: Hives  Allopurinol: Other, Rash  yellow gold rings: Rash  Prednisone: Unknown  HOME MEDICATIONS:  Home Medications: Medication Instructions Status  Onglyza 5 mg tablet 1 tab(s) orally once a day  Active  Vitamin D3 2000 intl units oral tablet 1 tab(s) orally once a day Active  Lyrica 150 mg oral capsule 1 cap(s) orally 3 times a day Active  enalapril 2.5 mg oral tablet 1 tab(s) orally once a day Active  aspirin 81 mg oral delayed release tablet 1 tab(s) orally once a day Active  timolol 0.5% ophthalmic solution 1 drop(s) to each eye 2 times a day Active  furosemide 20 mg oral tablet 1 tab(s) orally once a day Active   Family and Social History:  Family  History Hypertension  Diabetes Mellitus   Social History negative tobacco, negative ETOH, negative Illicit drugs   Review of Systems:  Fever/Chills No   Cough No   Sputum No   Constipation No   SOB/DOE No   Chest Pain No   Physical Exam:  GEN no acute distress   HEENT PERRL, moist oral mucosa   NECK supple   RESP normal resp effort  clear BS   CARD regular rate   ABD soft  normal BS  minimal RUQ tenderness   EXTR positive edema, bilateral LE   SKIN normal to palpation   NEURO cranial nerves intact   PSYCH alert, A+O to time, place, person   LABS:  Laboratory Results: Routine Chem:    19-Apr-14 19:62, Basic Metabolic Panel (w/Total Calcium)  Glucose, Serum 150  BUN 27  Creatinine (comp) 1.11  Sodium, Serum 141  Potassium, Serum 3.8  Chloride, Serum 109  CO2, Serum 25  Calcium (Total), Serum 8.5  Anion Gap 7  Osmolality (calc) 289  eGFR (African American) 52  eGFR (Non-African American) 45  eGFR values <65m/min/1.73 m2 may be an indication of chronic  kidney disease (CKD).  Calculated eGFR is useful in patients with stable renal function.  The eGFR calculation will not be reliable in acutely ill patients  when serum creatinine is changing rapidly. It is not useful in   patients on dialysis. The eGFR calculation may  not be applicable  to patients at the low and high extremes of body sizes, pregnant  women, and vegetarians.    19-Apr-14 11:46, CBC Profile  Result Comment   WBC DIFFERENTIAL - DIFFERENTIAL FROM ALBUMIN SMEAR   Result(s) reported on 24 Feb 2013 at 01:06PM.  Routine Hem:  WBC (CBC) 38.3  RBC (CBC) 3.44  Hemoglobin (CBC) 9.9  Hematocrit (CBC) 30.9  Platelet Count (CBC) 134  MCV 90  MCH 28.7  MCHC 32.0  RDW 15.8  Bands 4  Segmented Neutrophils 14  Lymphocytes 76  Variant Lymphocytes 5  Eosinophil 1  Diff Comment 1   ANISOCYTOSIS  Diff Comment 2   PLTS VARIED IN SIZE  Diff Comment 3   POLYCHROMASIA   Result(s) reported on  24 Feb 2013 at 01:06PM.   RADIOLOGY:  Radiology Results: XRay:    20-Apr-14 11:11, Chest Portable Single View  Chest Portable Single View  REASON FOR EXAM:    central line placement  COMMENTS:       PROCEDURE: DXR - DXR PORTABLE CHEST SINGLE VIEW  - Feb 25 2013 11:11AM     RESULT: Comparison is made to the earlier study the same date. The   peripherally inserted central catheter has been advanced slightly and is   in the superior portion of the superior vena cava just at the upper   margin of the aortic arch.    IMPRESSION:  Interval slight advance of the right-sided peripherally   inserted central catheter.    Dictation Site: 6    Verified By: Sundra Aland, M.D., MD  LabUnknown:    12-Apr-14 12:56, CT Abdomen and Pelvis Without Contrast  PACS Image    20-Apr-14 11:11, Chest Portable Single View  PACS Image  CT:    12-Apr-14 12:56, CT Abdomen and Pelvis Without Contrast  CT Abdomen and Pelvis Without Contrast  REASON FOR EXAM:    (1) pain; (2) pain  COMMENTS:       PROCEDURE: CT  - CT ABDOMEN AND PELVIS W0  - Feb 17 2013 12:56PM     RESULT: Comparison: None    Technique: Multiple axial images from the lung bases to the symphysis   pubis were obtained without oral and without intravenous contrast.    Findings:  Mild basilar opacities are likely secondary to atelectasis.    Lack of intravenous contrast limits evaluation of the solid abdominal   organs.  There is portal venous mass in the region of the caudate lobe     and the dome of the right hepatic lobe. The gallbladder, adrenals, and   pancreas are unremarkable. The spleen is unremarkable. There is wall   thickening of the stomach with mild adjacent inflammatory changes. There   is air within themesenteric vasculature along the greater curvature of   the stomach. There are findings concerning for pneumatosis of the   stomach. The findings are concerning for ischemic changes of the stomach.    Small  low-attenuation lesions in the kidneys likely represent cyst. No   hydronephrosis. There are multiple mildly enlarged retroperitoneal lymph   nodes. The largest measures 2.4 x 1.7 cm along the left side of the   distal abdominal aorta. The mildly enlarged lymph nodes extend along the   bilateral iliac chains as well.    The small and large bowel are normal in caliber. The patient is status   post hysterectomy. The appendix is normal.  No aggressive lytic or sclerotic osseous lesions are identified.  IMPRESSION:   1. There is wall thickening and pneumatosis of the stomach with adjacent   mesenteric gas as well as portal venous gas. The findings are highly   concerning for ischemia of the stomach. Surgical consultation is   suggested. This was called to Dr. Pollie Friar at 1314 hours 02/17/2013.  2. Mildly enlarged lymph nodes in the inferior retroperitoneum and iliac   chains are nonspecific, but would be in keeping with the reported history   of CLL.      Dictation Site: 8      Verified By: Gregor Hams, M.D., MD   ASSESSMENT AND PLAN:  Assessment/Admission Diagnosis 81 year old white female admitted 02/17/13 with N/V/D, PMH +CLL, HTN, DM. CT showed wall thickening of stomach and portal venous gas on 4/12. +Cdiff colitis and started on dificid. EGD was done today finding hiatal hernia, ischemic mucosa of gastric fundus/gastric body/greater curvature and antrum. Starting TPN today. We were consulted for further evaluation of ischemic gastrpathy. Reviewed with Dr. Delana Meyer. Will plan for CTA abdomen and pelvis in AM for further evaluation and determine treatment from there. Bicarb/mucomyst protocol for renal protection. Discussed with patient.   Electronic Signatures: [place], Bradshaw (PA-C)  (Signed 20-Apr-14 16:25)  Authored: Chief Complaint and History, PAST MEDICAL/SURGICAL HISTORY, ALLERGIES, HOME MEDICATIONS, Family and Social History, Review of Systems, Physical Exam,  LABS, RADIOLOGY, Assessment and Plan   Last Updated: 20-Apr-14 16:25 by Su Grand (PA-C)

## 2015-02-28 NOTE — Consult Note (Signed)
PATIENT NAME:  Chelsea [name], Chelsea [name] MR#:  [id number] DATE OF BIRTH:  01-08-1925  DATE OF CONSULTATION:  02/18/2013  CONSULTING PHYSICIAN:  Manya Silvas, MD  The patient is an 81 year old white female who began being ill. Had the onset on 04/11 of diarrhea and vomiting. Her diarrhea occurred 3 nights in a row. She took Imodium for this and it helped the diarrhea. She began vomiting the following day. She vomited food that she had eaten  several hours before. She vomited at least twice, maybe 4 or 5 times. There was no blood in the vomitus. No blood in the diarrhea. The last food that she had eaten before having this attack was some meat loaf purchased at [employer]. This was at least 6 hours before. She had never had anything quite like this before. She was admitted to the hospital.   The CAT scan showed impressive findings of air in the portal system and air in the wall of the stomach. Because of this nausea and vomiting and diarrhea I was asked to see her.   SOCIAL HISTORY: The patient lives at [facility] with her husband. 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   PAST MEDICAL HISTORY: 1.  CHF, type unknown.  2. CLL: White count runs in 20,000 range or higher.  3. A case of C. diff., 2008.  4. Hypertension.  5. Diabetes since 1995, and she has peripheral neuropathy.  6.  Renal insufficiency, probably related to hypertension and diabetes.  7.  Hypertension.  8.  Status post hysterectomy with incidental appendectomy at the same time.   ALLERGIES: ALLOPURINOL, PENICILLIN, PREDNISONE AND DARVOCET.   MEDICATIONS AT HOME: Vitamin D 2000 units, 1 a day, Timolol eyedrops for glaucoma, Onglyza 5 mg a day, Lyrica 150 mg 3 times a day, Lasix 40 mg a day, enalapril 2.5 mg a day, Colace 100 mg b.i.d., Bactrim and aspirin.   OTHER MEDICAL PROBLEMS: Include the fact that she has cellulitis on her legs. She was in the hospital in January  for this, and she has Unna boots on both legs.   No history of MI or CVA.   Currently she is on ice chips only.   PHYSICAL EXAMINATION: Examination shows temp 99.5, pulse 72, respirations 18, blood pressure 124/68, pulse oximetry 97% on 2 liters.   Elderly white female in no acute distress.   HEENT: Sclerae nonicteric. Conjunctivae negative. Tongue negative.  NECK: No masses. Trachea is in the midline. No carotid bruits.  CHEST: Is clear, anterolateral fields.  HEART: Shows no murmurs or gallops I can hear.  ABDOMEN: Shows a thick, palpable liver and spleen. Liver is about 3 fingers below the right costal margin. Spleen is about 2. No masses. She says her abdomen is not any more distended than it normally is.  EXTREMITIES: Palpable radial pulses. She has her Unna boots on both legs, and these were not removed.  SKIN: Is warm and dry.  PSYCHIATRIC: Mood and affect are appropriate. The patient is alert, oriented and a good historian.   LABS: Glucose 144, BUN 72, creatinine 1.95, sodium 145, potassium 4.8, chloride 118, CO2 of 21, calcium 8.7, total protein 5.9, albumin 3, total bili 0.5, alk phos 56, SGOT 30, SGPT 12, white blood count 52,800, hemoglobin 12.8, platelet count 95, 3 bands 5 variant lymphocytes,  7 monocytes. There are smudge cells present as well as anisocytosis, polychromasia, hypochromia.  Urinalysis essentially unremarkable. Lactic acid 1.7, slightly elevated.   CAT scan of the abdomen was read as showing air in the portal system as well as in the wall of the stomach. The air in the wall in the stomach could possibly be prominent folds with air pockets in between.   ASSESSMENT: The patient had an attack of nausea, vomiting and diarrhea, with improvement with Imodium several hours after eating meat loaf purchased at a restaurant. She has minimal, if any, discomfort at this time and is requesting liquids to drink and she has a mild low-grade temperature elevation. I do not  think at this time that she truly has air in the wall of her stomach or gastric processes that would be as ominous as that. Given her absence of pain, absence of tenderness, absence of pain with coughing I asked her to cough vigorously during the interview and it produced no symptoms of pain whatsoever.   RECOMMENDATIONS: I would continue antibiotics and proton pump inhibitor medication and would consider a CAT scan later. I see no reason at this time to do an endoscopy in the absence of more worrisome symptoms. I agree with the Protonix 40 mg IV q. 12 hours, and will follow along with you.     ____________________________ Manya Silvas, MD rte:dm D: 02/18/2013 13:41:31 ET T: 02/18/2013 14:38:36 ET JOB#: [id number]  cc: Manya Silvas, MD, <Dictator> Christopher A. Rexene Edison, MD Micheline Maze, MD Shreyang H. Posey Pronto, MD Manya Silvas MD ELECTRONICALLY SIGNED 03/21/2013 14:13

## 2015-02-28 NOTE — Consult Note (Signed)
Present Illness 81 year old  white female with history of diastollic congestive heart failure with recent echo revaling preserved lv function,  COPD,  on 2 liters oxygen, history of CLL currently in remission and hypertension, who was recently admitted to  from 02/17/2013 through 02/28/2013 for gastric wall ischemia. She was discharged to rehab facility and recently returned to her home. She states she was feeling well and ate a meal followed by acute onset of shortness of breath and wheezing. She states this was worse than she had had in the past . She states she had been compliant with her meds. SHe presented to the er where she was note to be hypoxic with mild elevated in her serum creatinine and mild troponin elevation. She improved iwth diuresis and oxygen. Her serum troponin was 1.28. She denies any chest pain. She denis excessiv esodium intake or increase in her weight.   Physical Exam:  GEN no acute distress, obese   HEENT PERRL, hearing intact to voice   NECK supple   RESP no use of accessory muscles  rhonchi   CARD Regular rate and rhythm  Normal, S1, S2  Murmur   Murmur Systolic   Systolic Murmur Out flow   ABD denies tenderness  normal BS   EXTR negative cyanosis/clubbing, negative edema   SKIN normal to palpation   NEURO cranial nerves intact, motor/sensory function intact   PSYCH alert   Review of Systems:  Subjective/Chief Complaint acute onset of shortness of breath   General: Fatigue  Weakness   Skin: No Complaints   ENT: No Complaints   Eyes: No Complaints   Neck: No Complaints   Respiratory: Short of breath   Cardiovascular: Tightness  Dyspnea   Gastrointestinal: No Complaints   Genitourinary: No Complaints   Vascular: No Complaints   Musculoskeletal: No Complaints   Neurologic: No Complaints   Hematologic: No Complaints   Endocrine: No Complaints   Psychiatric: No Complaints   Review of Systems: All other systems were reviewed and  found to be negative   Medications/Allergies Reviewed Medications/Allergies reviewed     elevated uric acid:    arthritis:    venous insufficiency both feet:    peripheral neuropathy legs and feet:    chf:    chronic lymphocytic leukemia:    c. diff:    basal cell skin cancer:    glaucoma:    HTN:    DM:    CLL:    appendectomy:    hysterectomy:   EKG:  EKG NSR   Abnormal NSSTTW changes   Interpretation no ischemc changes    Darvocet - N: N/V  PCN: Hives  Allopurinol: Other, Rash  yellow gold rings: Rash  Prednisone: Unknown   Impression 81 yo female with history of CLL in remission, history of diastollic chf with echo done during an admission several weeks ago with preserved lv funciotn who was admitted with acute onset of shortness of breath. She was recently admitted with gastric walll ischemia and had just returned to home when she develped acute onset of shortness of breath after eating with resultant hyoxia and has ruled in for a nstemi with elevated troonin to 1.28. She also has acute on chronic renal dysfunction. She has improved with oxygen and diuretics and one dose of antibiotics. CXR suggeted possible airspace disease vs pulmonary edema. CHF appears to be acute exacerbaton of chornic diastollic dysfunciton. She is hemodynamically stable. NSTEMI appears to be seoncdary to demand ischemia. Giiven comorbid condition and  recent gastric wall ischemia, would not proceed iwth invasive cardiac evaluation at present. Will attempt to control medically   Plan 1. Continue with heparin drip and follow hgb 2. Continue with asa and metoprolol at current dose. 3. COontinue emperic antibiotics. 4. Follow tropoin level 5. Follow renal function with careful diuresis.  6. Low sodium diet. 7. Further recs pending course.   Electronic Signatures: Teodoro Spray (MD)  (Signed 04-Jun-14 20:48)  Authored: General Aspect/Present Illness, History and Physical Exam,  Review of System, Past Medical History, EKG , Allergies, Impression/Plan   Last Updated: 04-Jun-14 20:48 by Teodoro Spray (MD)

## 2015-02-28 NOTE — H&P (Signed)
PATIENT NAME:  SARI, COGAN MR#:  [id number] DATE OF BIRTH:  1924-12-14  DATE OF ADMISSION:  04/11/2013  PRIMARY CARE PHYSICIAN: Youlanda Roys. Lovie Macadamia, MD  REFERRING PHYSICIAN: Valli Glance. Owens Shark, MD   CHIEF COMPLAINT: Shortness of breath.   HISTORY OF PRESENT ILLNESS: Ms. Lauf is an 81 year old pleasant white female with history of multiple medical problems, congestive heart failure, diastolic, COPD, chronic respiratory failure, on 2 liters oxygen, CLL in remission, hypertension, who had a recent admission to [facility] from 02/17/2013 through 02/28/2013 for gastric wall ischemia. The patient was placed on TPN and was discharged to [facility]. The patient was discharged back to [facility]. Has been doing well, in her usual state of health, undergoing physical therapy. The patient states today participated in physical therapy and occupational therapy. In the evening, ate her dinner. Around 9:00 p.m., started to experience sudden onset of shortness of breath. The patient denied having any chest pain. Considering this, EMS was called. When EMS arrived, the patient's initial oxygen saturations were 82%. The patient was diffusely wheezing. Concerning this, the patient is brought to the Emergency Department. Chest x-ray is consistent with vascular congestion. The patient received breathing treatments in the Emergency Department. There was initial concern about pneumonia in the right lower lobe. The patient received 1 dose of Levaquin. However, the patient has been afebrile, has no white blood cell count in the Emergency Department. The patient also states that denies having any cough, productive sputum.   PAST MEDICAL HISTORY:  1. Congestive heart failure, diastolic, with normal EF. 2. CLL. 3. C. difficile colitis. 4. Osteoarthritis. 5. Basal cell CA. 6. Glaucoma.  7. Hypertension.  8. Diabetes mellitus.   9. CKD with baseline creatinine of 1.85 to 2.53.   PAST SURGICAL HISTORY:   1. Appendectomy.  2. Hysterectomy.   ALLERGIES:  1. DARVOCET.  2. PENICILLIN.  3. ALLOPURINOL.  4. YELLOW GOLD  5. PREDNISONE.   HOME MEDICATIONS:  1. Timolol ophthalmic drops to each eye. 2. Sucralfate 1 gram 4 times a day.  3.  2 times a day.  4. Reglan 10 mg 3 times a day.  5. Protonix 40 mg 2 times a day.  6. Oxycodone 5 mg every 6 hours as needed.  7. Oxybutynin 5 mg 2 times a day.  8. Lasix 20 mg once a day as needed; however, has not been taking since the hospital admission. 9. Lantus 10 units subcutaneous daily.  10. Gabapentin 200 mg once a day.  11. Erythromycin oral 3 times a day.   SOCIAL HISTORY: No history of smoking, drinking alcohol or using illicit drugs. Married, lives with her husband at [facility] assisted living facility.   FAMILY HISTORY: Hypertension and diabetes mellitus.   REVIEW OF SYSTEMS:  CONSTITUTIONAL: Generalized weakness.  EYES: No blurred vision.  ENT: No change in hearing. No tinnitus.  CARDIOVASCULAR: No chest pain or palpitations. Shortness of breath. Increased swelling in the lower extremities.  GASTROINTESTINAL: No nausea, vomiting, abdominal pain.  GENITOURINARY: Has been having urinary incontinence.  ENDOCRINE: No polyuria or polydipsia. Has history of diabetes mellitus.  SKIN: No rash or lesions.  MUSCULOSKELETAL: Multiple joint pains.  NEUROLOGIC: No weakness or numbness.   PHYSICAL EXAMINATION:  GENERAL: This is a well-built, well-nourished, age-appropriate female lying down in the bed, not in distress.  VITAL SIGNS: Temperature 98.4, pulse 94, blood pressure 139/64, oxygen saturation is 97% on 3 liters of oxygen.  HEENT: Head normocephalic, atraumatic. Eyes: No scleral icterus.  Conjunctivae normal. Pupils equal and reactive to light. Extraocular movements are intact. Mucous membranes moist. No pharyngeal erythema.  NECK: Supple. No lymphadenopathy. No JVD. No carotid bruit.  CHEST: Has no focal tenderness.  LUNGS: Occasional  bibasilar crackles are heard. Good air entry bilaterally.  HEART: S1 and S2 regular. Tachycardia. Lower extremity swelling 1+ around the ankles, extending up to the knees. Pulses 2+.   ABDOMEN: Bowel sounds present. Soft, nontender, nondistended. No hepatosplenomegaly is appreciated.  SKIN: No rash or lesions.  MUSCULOSKELETAL: Good range of motion in all the extremities.  LYMPHATIC: No axillary or inguinal lymphadenopathy.  NEUROLOGIC: The patient is alert, oriented to place, person and time. Cranial nerves II through XII intact. Motor 5/5 in upper and lower extremities.   LABORATORY DATA: Troponin 0.07. Chest x-ray, PA and lateral: Coarse lung markings in the perihilar and infrahilar region bilaterally. These findings may reflect pneumonia; however, these are not a new finding. ABG: pH of 7.40, pCO2 of 33, PaO2 of 154. CMP: Potassium 3.2, the rest of all the values are within normal limits. CBC: WBC of 39,000, hemoglobin 9.8, platelet count of 237.   ASSESSMENT AND PLAN: Ms. Guest is an 81 year old female who comes to the Emergency Department with shortness of breath of sudden onset.   1. Congestive heart failure. The patient is afebrile, has no change in the white blood cell count from the baseline. This is unlikely pneumonia considering especially the sudden onset. Most likely congestive heart failure, diastolic. The patient had echocardiogram recently which showed ejection fraction of greater than 55%. Admit the patient to the monitored bed. Continue with the Lasix and strict I's and O's. The precipitating factor is uncertain, possibly acute coronary syndrome considering the patient's mild elevation of the troponin; however, CK-MB is normal. Will continue to cycle cardiac enzymes x3. Exertion with the physical therapy. The other possibility is the patient was chronically on Lasix; however, has been taken off of Lasix since the patient was diagnosed with gastric ischemia. Possibly fluid overload over  time.  2. Hypertension, currently well controlled. Continue with the home medications.  3. Diabetes mellitus. Continue with the home dose of insulin.  4. Chronic lymphocytic leukemia, currently in remission.  5. Debility. Will continue the physical therapy, occupational therapy.  6. Keep the patient on deep vein thrombosis prophylaxis with Lovenox.   TIME SPENT: 45 minutes.   ____________________________ Monica Becton, MD pv:OSi D: 04/11/2013 07:57:11 ET T: 04/11/2013 08:43:51 ET JOB#: [id number]  cc: Monica Becton, MD, <Dictator> Monica Becton MD ELECTRONICALLY SIGNED 04/15/2013 19:42

## 2015-02-28 NOTE — Discharge Summary (Signed)
PATIENT NAME:  Chelsea [name], Chelsea [name] MR#:  [id number] DATE OF BIRTH:  05-08-1925  DATE OF ADMISSION:  11/21/2012 DATE OF DISCHARGE:  11/24/2012  PRIMARY CARE PHYSICIAN: [name], MD  DISCHARGE DIAGNOSES:  1. Left leg cellulitis with lymphedema.  2. Acute on chronic congestive heart failure, diastolic dysfunction, normal ejection fraction.  3. Acute renal failure on chronic kidney disease.  4. Chronic lymphocytic leukemia. 5. Leukocytosis.  6. Hypertension.  7. Diabetes.  8. Chronic obstructive pulmonary disease.  9. Glaucoma.   CODE STATUS: FULL CODE.   CONDITION: Stable.   HOME MEDICATIONS: 1. Onglyza 5 mg p.o. daily. 2. Lasix 40 mg p.o. daily.  3. Vitamin D3 2000 international unit tablets 1 tablet daily. 4. Lyrica 150 mg p.o. 3 times daily. 5. Enalapril 2.5 mg p.o. daily. 6. Aspirin 81 mg p.o. daily. 7. Timolol 0.5% ophthalmic solution one drop to each eye 2 times a day. 8. Colace 100 mg p.o. 2 times a day. 9. Bacitracin 500 units/g ophthalmic ointment one application to right eye 2 times a day. 10. Levaquin 250 mg p.o. once a day for 5 days.   DIET: Low sodium, low fat, low cholesterol, ADA, renal diet.   ACTIVITY: As tolerated.   FOLLOW-UP CARE: Follow up with PCP within one week. The patient needs to follow up with PCP to monitor BMP. The patient needs to follow up with the [facility] for left leg cellulitis of wound. Follow up with ophthalmology within one week.   REASON FOR ADMISSION: Left leg swelling, unable to ambulate.   HOSPITAL COURSE: The patient is an 81 year old Caucasian female with a history of CLL,  chronic lymphedema for years, in both legs, who presented to the ED due to swelling in the left leg which is more than usual. The patient was unable to walk. For detailed history and physical examination, please refer to the admission note dictated by Dr. Vianne Bulls. On admission date, the patient's BNP was 3713, CK 887, CK-MB 11.1, WBC 27.7, hemoglobin 10.9,  BUN 47 and creatinine increased to 2.92. The patient was admitted for left leg cellulitis and congestive heart failure, acute on chronic.  1. Left leg cellulitis. After admission the patient has been treated with vancomycin and Levaquin IVPB. We consulted the wound care team to take care of blister on the left leg. They changed the dressing and suggested the patient's left leg cellulitis is possibly due to leg edema with blister. 2. Acute on chronic congestive heart failure with diastolic dysfunction. Echo showed normal ejection fraction on this admission. The patient has been treated with Lasix 40 mg IV every 12 hours. Edema is becoming better.  3. Acute renal failure on chronic kidney disease. The patient's creatinine was 2.92 and today decreased to 2.21. However, BUN increased from 47 to 64. We will decrease Lasix to 40 mg p.o. daily after discharge and the patient needs to monitor BMP with primary care physician. 4. Leukocytosis. The patient has history of CLL. WBC was 27.7 on admission date and now decreased to 19.9, which is possibly due to CLL. The patient may need to follow up with an oncologist as an outpatient after discharge.             DISPOSITION: The patient is clinically stable and will be discharged to [facility] skilled nursing facility today. I discussed the patient's discharge plan with the patient and case manager.   TIME SPENT: About 38 minutes.  ____________________________ Demetrios Loll, MD qc:sb D: 11/24/2012 11:41:34 ET  T: 11/24/2012 12:09:02 ET JOB#: [id number]  cc: Demetrios Loll, MD, <Dictator> Demetrios Loll MD ELECTRONICALLY SIGNED 11/24/2012 18:28

## 2015-02-28 NOTE — Consult Note (Signed)
CC: melena, abn CT scan of abd, fall in hgb.  Pt hgb now stable for 3 days, no melena now, no abd pain today but overall doesn't feel well. No new SOB, abd not tender, no vomiting today.  Tol liquid diet ok.  Continue current treatment, 2-3 more days of liquid diet, then either EGD or empiric advancement of diet.  Dr. Allen Norris will cover over 3 day weekend.  Electronic Signatures: Manya Silvas (MD)  (Signed on 17-Apr-14 18:34)  Authored  Last Updated: 17-Apr-14 18:34 by Manya Silvas (MD)

## 2015-02-28 NOTE — Consult Note (Signed)
Pt CC was diarrhea, abd pain.  She had one loose stool today by 2:30 PM.  aabd without pain or tenderness on my exam.  on vigerous palpation.  Will try Glucerna and yogurt and see how she does.  Electronic Signatures: Manya Silvas (MD)  (Signed on 14-Apr-14 15:35)  Authored  Last Updated: 14-Apr-14 15:35 by Manya Silvas (MD)

## 2015-02-28 NOTE — Consult Note (Signed)
Chief Complaint:  Subjective/Chief Complaint The patient reports that she is doing well today. No problems with vomiting today. had one bowel movement reported today and ast evening. No abdominal pain.   VITAL SIGNS/ANCILLARY NOTES: **Vital Signs.:   18-Apr-14 09:07  Vital Signs Type Q 4hr  Temperature Temperature (F) 97.7  Celsius 36.5  Temperature Source oral  Pulse Pulse 73  Respirations Respirations 20  Systolic BP Systolic BP 762  Diastolic BP (mmHg) Diastolic BP (mmHg) 74  Mean BP 96  Pulse Ox % Pulse Ox % 97  Pulse Ox Activity Level  At rest  Oxygen Delivery 2L   Brief Assessment:  Respiratory normal resp effort   Gastrointestinal Normal   Gastrointestinal details normal Soft  Nontender  No gaurding   Lab Results: Routine Hem:  17-Apr-14 12:37   WBC (CBC)  41.8  RBC (CBC)  3.38  Hemoglobin (CBC)  9.8  Hematocrit (CBC)  30.6  Platelet Count (CBC)  104  MCV 91  MCH 29.0  MCHC 32.1  RDW  15.3  Neutrophil % 30.6  Lymphocyte % 66.4  Monocyte % 2.6  Eosinophil % 0.3  Basophil % 0.1  Neutrophil #  12.8  Lymphocyte #  27.8  Monocyte #  1.1  Eosinophil # 0.1  Basophil # 0.0 (Result(s) reported on 22 Feb 2013 at 01:48PM.)   Assessment/Plan:  Assessment/Plan:  Assessment Nausea with melena and black stools.   Plan Hb not checked today. No new compaints. Nausea stable. Continue current care.   Electronic Signatures: Lucilla Lame (MD)  (Signed 18-Apr-14 10:33)  Authored: Chief Complaint, VITAL SIGNS/ANCILLARY NOTES, Brief Assessment, Lab Results, Assessment/Plan   Last Updated: 18-Apr-14 10:33 by Lucilla Lame (MD)

## 2015-02-28 NOTE — Consult Note (Signed)
PATIENT NAME:  Chelsea [name], REIGER MR#:  [id number] DATE OF BIRTH:  05/05/1925  DATE OF CONSULTATION:  02/25/2013  REFERRING PHYSICIAN:  Dr. Marina Gravel.  CONSULTING PHYSICIAN:  Trei Schoch R. Ma Hillock, MD.  REASON FOR CONSULTATION: (?) hypercoagulable state, known CLL, ischemic gastropathy.   HISTORY OF PRESENT ILLNESS: The patient is an 81 year old female with long-standing history of chronic lymphocytic leukemia early stage on observation. The patient was last evaluated at the [facility] at the end of February 2014 where WBC count was 24,700, hemoglobin 11.6, platelets 126, lymphocyte count 18,200, ANC 5500. She has had previous hospitalization for CHF and has dyspnea on exertion. The patient is currently admitted to the hospital this time on April 12th with complaints of abdominal pain, nausea, vomiting, diarrhea. CT scan showed portal venous gas and possible ischemic stomach. She had EGD on April 20th. Shows findings suggestive of ischemic mucosa in the gastric fundus and gastric body, greater curvature and antrum. This has been biopsied. The patient states that overall she is better but still has intermittent nausea and some abdominal pain. She has been seen by vascular surgery and is planned for CT angiogram tomorrow. I   PAST MEDICAL HISTORY AND PAST SURGICAL HISTORY:  1. Chronic lymphocytic leukemia.  2. History of C. diff.  3. History of arthritis.  4. CHF.   5. Glaucoma.  6. Hypertension.  7. Diabetes.  8. Appendectomy.  9. Hysterectomy.  10. Basal cell cancer history.  11. Chronic renal insufficiency.   ALLERGIES: INCLUDE DARVOCET, PENICILLIN, ALLOPURINOL, YELLOW GOLD AND PREDNISONE.   HOME MEDICATIONS: Lyrica 150 mg p.o. t.i.d., Lasix 40 mg daily, enalapril 2.5 mg daily, Colace 10 mg b.i.d., aspirin 81 mg daily, bacitracin topical twice daily, vitamin D 2000 units 1 daily.   REVIEW OF SYSTEMS:  CONSTITUTIONAL: Generalized weakness. Denies fevers or chills.  HEENT: Currently denies headaches  or dizziness at rest. No epistaxis, ear or jaw pain.  CARDIAC: Denies angina, palpitation, orthopnea or PND. On nasal cannula oxygen.  LUNGS: Has dyspnea on movement or activity. Denies any cough, sputum or hemoptysis.  GASTROINTESTINAL: As in HPI. Denies any bright red blood in stools or melena.  GENITOURINARY: No dysuria or hematuria.  MUSCULOSKELETAL: Has arthritis. No new bone pains.  EXTREMITIES: Has swelling. Denies new pain.  NEUROLOGIC: No new focal weakness, seizures or loss of consciousness.   ENDOCRINE: No polyuria or polydipsia.   PHYSICAL EXAMINATION:  GENERAL: The patient is an elderly, moderately built and well nourished individual resting in bed, alert and oriented to self, place, person and time and converses appropriately. Mild pallor.  VITAL SIGNS: 97.8, 84, 18, 172/87, 96% on 2 liters.  HEENT: Normocephalic, atraumatic. Extraocular movements intact. Sclerae anicteric.  NECK: Negative for lymphadenopathy.  CARDIOVASCULAR: Normal S1, S2, regular rate and rhythm.  LUNGS: Bilateral diminished breath sounds, more so at bases. No rhonchi.  ABDOMEN: Soft, mild tenderness in epigastrium. No hepatosplenomegaly clinically.  LYMPHATICS: No adenopathy in the axillary or inguinal areas.  EXTREMITIES: Bilateral edema. No cyanosis.  SKIN: No generalized rashes.  NEUROLOGIC: Limited exam. Cranial nerves seem intact. Moves all extremities spontaneously.   LAB RESULTS: As in HPI above. CBC from yesterday shows WBC 38,300, hemoglobin 9.9, platelets 134, neutrophils 14%, lymphocytes 76%, creatinine 1.11, calcium 8.5.   IMPRESSION AND RECOMMENDATIONS: An 81 year old female patient with long-standing history of chronic lymphocytic leukemia on observation (early stage) who has been admitted to the hospital with gastrointestinal symptoms and abdominal pain. CT scan and esophagogastroduodenoscopy findings are consistent with ischemic gastropathy.  WBC count on April 13th had been higher at 52,800,  with platelet count of 95. This has improved down to 38,300 and platelet count of 134 which is closer to her usual baseline range of leukocytosis from chronic lymphocytic leukemia. Given fairly steady and mild leukocytosis, it is unlikely that chronic lymphocytic leukemia is related to ongoing gastrointestinal/stomach issues. Agree with ongoing supportive treatment. She is awaiting further evaluation with CT angiogram by Dr. Delana Meyer. Continue to monitor CBC intermittently during the hospitalization. Otherwise, no new intervention recommended for chronic lymphocytic leukemia at this time. Will continue to follow intermittently during hospitalization. The patient explained above, agreeable to this plan.   Thank you for the referral. Please feel free to contact me if any additional questions.    ____________________________ Rhett Bannister Ma Hillock, MD srp:gb D: 02/26/2013 00:13:19 ET T: 02/26/2013 01:17:43 ET JOB#: [id number]  cc: Laketta Soderberg R. Ma Hillock, MD, <Dictator> Alveta Heimlich MD ELECTRONICALLY SIGNED 02/26/2013 9:47

## 2015-02-28 NOTE — Consult Note (Signed)
Pt CC melena and vomting.  Pt with C. diff colitis, flagyl started and advanced to qid and she reported vomiting. She also reported black tarry stools.  She did have a drop in hgb from 12 to 9.  This occured when tried to advance diet.  Will drop back to clear liquids and screen and type her in case any further bleeding.  Will change to Dificid due to nausea and vomiting  with Flagyl and since the hospital uses a liquid preparation of oral vancomycin that may cause nausea and vomiting also..  May need to do EGD tomorrow.  Electronic Signatures: Manya Silvas (MD)  (Signed on 16-Apr-14 13:30)  Authored  Last Updated: 16-Apr-14 13:30 by Manya Silvas (MD)

## 2015-02-28 NOTE — Consult Note (Signed)
PATIENT NAME:  JOYEL, Chelsea [name] MR#:  [id number] DATE OF BIRTH:  1925-09-21  DATE OF CONSULTATION:  02/17/2013  REFERRING PHYSICIAN:  Harrell Gave A. Lundquist, MD CONSULTING PHYSICIAN:  Sherlyn Ebbert H. Posey Pronto, MD  REASON FOR CONSULTATION: Opinion regarding the patient's diabetes, CLL, history of CHF, hypertension,   HISTORY OF PRESENT ILLNESS: The patient is an 81 year old white female with history of hypertension, CLL, chronic renal failure, hypertension, diabetes, previous history of CHF, history of having C. diff. in 2008, who reports that she was on antibiotics for a left leg wound infection, and she finished a 10-day course of antibiotics, and then since yesterday, she started having emesis and diarrhea, which was liquid in nature, for the past 12 to 14 hours. She has not had any fevers, chills, night sweats or shortness of breath. She does have abdominal pain in the lower epigastric region, which she describes it as a dull type of ache. Otherwise denies any hematemesis or hematochezia.   PAST MEDICAL HISTORY: Significant for:  1.  History of CHF, type unknown. 2.  History of CLL. WBC count usually is in the 20,000.  3.  History of C. diff. in 2008.  4.  History of arthritis.  5.  History of basal cell cancer.  6.  History of glaucoma.  7.  History of hypertension.  8.  Diabetes.  9.  Status post appendectomy.  10. Status post hysterectomy.  11. Has chronic renal failure. Baseline creatinine anywhere from 2.53 to 1.85.   ALLERGIES: TO ALLOPURINOL, DARVOCET, PENICILLIN, PREDNISONE, YELLOW GOLD RINGS.   HOME MEDICATIONS:  1.  Vitamin D 2000 one tab p.o. daily.  2.  Timolol 0.5 ophthalmic one drop to each eye b.i.d.  3.  Onglyza 5 mg p.o. daily.  4.  Lyrica 150 p.o. t.i.d. 5.  Lasix 40 daily.  6.  Enalapril 2.5 p.o. daily.  7.  Colace 10 p.o. b.i.d. 8.  Bactrim to right eye b.i.d. 9.  Aspirin 81 mg one tab p.o. daily.   SOCIAL HISTORY: Lives at [facility] with her husband. Denies any  tobacco or alcohol use.   FAMILY HISTORY: Positive for hypertension and diabetes.   REVIEW OF SYSTEMS:  CONSTITUTIONAL: Denies any fevers or chills. No weight loss or weight gain.  HEENT: Denies any double vision. No erythema. No cataracts. Does have glaucoma.  ENT: Denies any ringing in the ears. No hearing difficulty. Denies any nasal congestion. No seasonal allergies. No difficulty swallowing.  CARDIOVASCULAR: Denies any chest pains or palpitations. No syncope. Has hypertension.  PULMONARY: Denies any cough or wheezing. No hemoptysis. No COPD. Complains of occasional wheezing.  GASTROINTESTINAL: Abdominal pain as above. Nausea and vomiting as above. No hematemesis. No hematochezia.  GENITOURINARY: Denies any frequency, urgency or hesitancy.  SKIN: Has chronic lower extremity ulcers.  NEUROLOGIC: Denies any CVA, TIA or seizures.  PSYCHIATRIC: No anxiety. No insomnia. No bipolar or schizophrenia.   PHYSICAL EXAMINATION: VITAL SIGNS: Temperature 99.4, pulse 91, respirations 18, blood pressure 142/73, O2 97% on room air.  GENERAL: The patient is an obese Caucasian female. Appears weak but is not in any acute distress.  HEENT: Head atraumatic, normocephalic. Pupils equally round and reactive to light and accommodation. There is no conjunctival pallor. No scleral icterus. Nasal exam shows no drainage or ulceration. Oropharynx is clear without any exudate.  NECK: No thyromegaly. No carotid bruits.  CARDIOVASCULAR: Regular rate and rhythm. No murmurs, rubs, clicks or gallops. PMI is not displaced.  LUNGS: Clear to auscultation bilaterally without any  rales, rhonchi or wheezing, and there is no accessory muscle usage.  ABDOMEN: There is mild epigastric lower abdomen tenderness. No guarding. No rebound.  EXTREMITIES: She has got bilateral lower extremities that are wrapped.  SKIN: Otherwise, no rashes noted.  MUSCULOSKELETAL: There is no erythema or swelling.  LYMPHATICS: No lymph nodes palpable.   VASCULAR: Good DP and PT pulses.  PSYCHIATRIC: Not anxious or depressed.  NEUROLOGICAL: Awake, alert and oriented x 3. No focal deficits.   EVALUATION: UA: Nitrites negative, leukocytes negative. Lactic acid level 1.7. CT scan of the abdomen and pelvis shows wall thickening and pneumatosis of the stomach with adjacent mesentery gas, as well as portal venous gas. Glucose 197, BUN 86, creatinine 2.61, sodium 143, potassium 4.8, chloride 112, CO2 is 21. LFTs are normal. WBC 54.2, hemoglobin 12, platelet count was 113. Troponin less than 0.02.   ASSESSMENT AND PLAN: The patient is an 81 year old admitted with abdominal pain noted to have portal air, as well as air within the greater curvature of the stomach.  1.  Abdominal pain, diarrhea, nausea and vomiting with portal air, as well as thickening of the stomach wall, as well as air in the greater curvature. Could be possible infectious. Could be due to ischemia. At this time agree with supportive care with empiric antibiotics with Cipro and Flagyl. If she has diarrhea, we will check her stool studies.  2.  Diabetes. We will continue sliding scale insulin for now. Follow blood glucose. We will hold Onglyza.  3.  Chronic lymphocytic leukemia with increase in baseline leukocytosis. This could be just a stress reaction from her acute illness. At this time, we will follow up CBC in the morning. If it continues to stay elevated then will need a hematology evaluation for possible conversion of CLL to a different type of leukemia. 4.  Hypertension. We will hold on enalapril for the time being.  5.  History of congestive heart failure. I will go ahead and decrease her IV fluid rate to 125 mL to make sure the patient does not become volume overloaded.  6.  Chronic renal failure, likely stage III. Creatinine is fluctuating in the past but close to baseline.  7.  Miscellaneous. Continue the patient on Protonix. She is on daily. I will change that to b.i.d. in light  of the gastric findings.  8.  Code status. Again discussed with the patient. She wishes a FULL CODE at this time. Based on her age and her illness, if she deteriorates would recommend palliative care to assist to discuss code status further if she deteriorates.   TIME SPENT: Forty-five minutes.   ____________________________ Lafonda Mosses Posey Pronto, MD shp:lg D: 02/17/2013 18:11:09 ET T: 02/17/2013 19:22:00 ET JOB#: [id number]  cc: Abbigale Mcelhaney H. Posey Pronto, MD, <Dictator> Alric Seton MD ELECTRONICALLY SIGNED 02/20/2013 20:34

## 2015-02-28 NOTE — Discharge Summary (Signed)
PATIENT NAME:  Chelsea [name], MCPHERSON MR#:  [id number] DATE OF BIRTH:  07/19/1925  DATE OF ADMISSION:  02/17/2013 DATE OF TRANSFER:  03/01/2013   BRIEF HISTORY: The patient is an 81 year old woman admitted to the hospital with a several-week history of abdominal pain, nausea and vomiting. Work-up revealed a long-standing history of multiple medical problems including congestive heart failure, chronic lymphocytic leukemia, hypertension and insulin-dependent diabetes. She had some diarrhea in addition to her abdominal discomfort. She was admitted to the hospital for work-up of possible C. difficile colitis. However, CT scan performed during her initial evaluation revealed portal venous air and air within the vascular mesentery and the greater curve of the stomach wall suggestive of possible gastric ischemia. She was relatively stable, and in view of the chronicity of this problem she was admitted to the hospital, placed on IV rehydration, checked for C. difficile colitis and started on antibiotic therapy. She has a history of a previous appendectomy and previous hysterectomy, chronic renal insufficiency.   MEDICATIONS:  She was on multiple medications at the time of admission including Onglyza 5 mg p.o. daily, Lyrica 150 mg p.o. t.i.d., Lasix 40 mg p.o. daily, enalapril 2.5 mg p.o. daily, Colace 10 mg p.o. b.i.d., bacitracin to her eyes 500 mg b.i.d. and aspirin 81 mg daily.   ALLERGIES: DARVOCET, PENICILLIN, ALLOPURINOL AND PREDNISONE.   SOCIAL HISTORY: She lives with her husband at [facility].    HOSPITAL COURSE: Further work-up suggested significant GI ischemia. She was treated with rehydration and seen by the Vascular Surgery service and the Gastroenterology service. Initially, the Gastroenterology service did not see any indication for endoscopy. She was tried on a liquid diet but continued to have significant abdominal pain and nausea. The Vascular Surgery service recommended a mesenteric angiogram or a CT  angiogram. CT angiogram did demonstrate patent blood vessels to the stomach without evidence of any significant flow-limiting obstruction. The GI service then recommended endoscopy which was performed by Dr. Lucilla Lame on April 20th. At that time, she had a large area of what appeared to be eschar and a strip of ischemic mucosa along the greater curvature. She was placed on TPN through a PICC line and has been maintained on TPN since that time. Because of the chronicity of her problems, the multiple comorbidities, underlying malignancy and her fragility, we elected not to consider surgical reconstruction which would require a possible total gastrectomy.  Our plan is to continue her TPN for 4 to 6 weeks, at which time we will re-evaluate with upper endoscopy and consider whether further intervention would be necessary. It is important to note that on the 22nd she developed an acute episode of shortness of breath which appeared to be secondary to congestive heart failure, and she was given additional Lasix. CT scan was performed at that time which did not demonstrate any evidence of a pulmonary embolus. The current plan is to transfer her to a long-term care facility in an effort to allow her to take TPN as an outpatient until she can be further evaluated with upper endoscopy. The family is in agreement with this plan.   DISCHARGE MEDICATIONS: Include: Onglyza 5 mg once a day, Lyrica 150 mg t.i.d., enalapril 2.5 mg once a day, aspirin 81 mg p.o. once a day, furosemide 20 mg once a day, tramadol 150 mg q. 6 hours, isosorbide mononitrate 30 mg once a day, metoprolol 25 mg every 6 hours, Protonix 40 mg p.o. b.i.d.   ____________________________ Micheline Maze, MD  rle:cb D: 03/01/2013 16:16:55 ET T: 03/01/2013 16:28:21 ET JOB#: [id number]  cc: Micheline Maze, MD, <Dictator> Rodena Goldmann MD ELECTRONICALLY SIGNED 03/02/2013 10:44

## 2015-02-28 NOTE — Consult Note (Signed)
PATIENT NAME:  Chelsea [name], Chelsea [name] MR#:  [id number] DATE OF BIRTH:  10/15/1925  DATE OF CONSULTATION:  02/28/2013  REFERRING PHYSICIAN:  Manya Silvas, MD CONSULTING PHYSICIAN:  Demetrios Loll, MD PRIMARY CARE PHYSICIAN:  Youlanda Roys. Lovie Macadamia, MD  REASON FOR CONSULTATION: Shortness of breath since last night.  HISTORY OF PRESENT ILLNESS: The patient is an 81 year old Caucasian female with history of CHF, diastolic dysfunction, CLL, C. diff., was admitted for abdominal pain since April 12 with diagnosis of gastric ischemia and C. diff. colitis. The patient has been treated with Dificid and Levaquin. She has been keeping n.p.o. and on TPN due to gastric ischemia. The patient developed shortness of breath since last night. Chest x-ray showed pulmonary edema. CAT scan of chest showed no PE, but has pulmonary edema. The patient was treated with Lasix. Symptoms are getting better. Dr. Vira Agar suggested medicine consult due to shortness of breath.   PAST MEDICAL HISTORY:  1.  CHF, diastolic dysfunction with normal ejection fraction.  2.  CLL. 3.  C. diff. colitis. 4.  Arthritis.  5.  Basal cell cancer.  6.  Glaucoma. 7.  Hypertension. 8.  Diabetes.  9.  Also patient has CKD. Her baseline was 2.53 to 1.85.   PAST SURGICAL HISTORY: Appendectomy, hysterectomy.   SOCIAL HISTORY: No smoking or drinking or illicit drugs. The patient comes from [place] home.   FAMILY HISTORY: Hypertension, diabetes.   REVIEW OF SYSTEMS:    CONSTITUTIONAL: The patient denies any fever or chills. No headache or dizziness but has weakness.  EYES: No double or blurred vision.  EARS, NOSE, THROAT: No postnasal drip, slurred speech or dysphagia. No epistaxis.  CARDIOVASCULAR: No chest pain, palpitation, orthopnea or nocturnal dyspnea but has chronic leg edema, wears Unna boots.  PULMONARY: Positive for shortness of breath and cough without sputum. No wheezing. No hemoptysis.  GASTROINTESTINAL: No abdominal pain,  nausea, vomiting. Had loose stool twice today but no melena or bloody stool.  GENITOURINARY: No dysuria, hematuria or incontinence.  SKIN: No rash or jaundice.  NEUROLOGIC: No syncope, loss of consciousness or seizure.   HOME MEDICATIONS:  1.  Vitamin D3, 2000 units 1 tablet once a day.  2.  Timolol 0.5% ophthalmic solution 1 drop to each eye twice a day.  3.  Onglyza 5 mg p.o. daily.  4.  Lyrica 150 mg p.o. t.i.d.  5.  Lasix 1 tablet once a day.  6.  Enalapril 2.5 mg p.o. once a day.  7.  Aspirin 81 mg p.o. daily.   ALLERGIES: ALLOPURINOL, DARVOCET, PENICILLIN, PREDNISONE, YELLOW GOLD RINGS.   PHYSICAL EXAMINATION:  VITAL SIGNS: Temperature 98, blood pressure 149/82, pulse 87, respirations 18, O2 saturation 98% on 4 liters oxygen by nasal cannula.  GENERAL: The patient is alert, awake, oriented, in no acute distress.  HEENT: Pupils round, equal and reactive to light and accommodation. Moist oral mucosa. Clear oropharynx.  NECK: No JVD or carotid bruit. No lymphadenopathy. No thyromegaly.  CARDIOVASCULAR: S1, S2, regular rate and rhythm. No murmurs or gallops.  PULMONARY: Bilateral air entry. No wheezing or rales. No use of accessory muscles to breathe.  ABDOMEN: Soft. No distention or tenderness. No organomegaly. Bowel sounds present.  EXTREMITIES: Bilateral legs in dressing. No tenderness. Bilateral pedal pulses present.  SKIN: No rash or jaundice.  NEUROLOGIC: Alert and oriented x 3. No focal deficit. Power 5/5. Sensation intact.   LABORATORY AND RADIOLOGICAL DATA: Glucose 175. WBC 50.1, hemoglobin 9.2, platelets 230. Glucose 219, BUN 8,  creatinine 0.94. Electrolytes normal. Magnesium 1.8, phosphate 2.4. CAT scan of chest with contrast showed no evidence of PE, but has pulmonary edema with mediastinal lymphadenopathy. Chest x-ray showed bilateral diffuse interstitial thickening, likely  representing interstitial edema.   IMPRESSIONS  1.  Acute on chronic diastolic congestive heart  failure with normal ejection fraction.  2.  Pulmonary edema.  3.  Gastric ischemia.  4.  Clostridium difficile colitis.  5.  Anemia.   RECOMMENDATIONS:  1.  Agree with continuing O2 by nasal cannula. Will continue Lasix 20 mg IV daily, but we need to closely monitor the patient's BMP because the patient has CKD.  2.  Agree with low-rate IV fluids and TPN.  3.  Continue Dificid and Levaquin.  4.  For diabetes, continue sliding scale.  5.  GI and DVT prophylaxis.   Discussed the patient's condition and plan of treatment with the patient and the patient's husband.   TIME SPENT: About 65 minutes.   ____________________________ Demetrios Loll, MD qc:jm D: 02/28/2013 19:14:26 ET T: 02/28/2013 19:40:55 ET JOB#: [id number]  cc: Demetrios Loll, MD, <Dictator> Demetrios Loll MD ELECTRONICALLY SIGNED 02/28/2013 22:19

## 2015-02-28 NOTE — H&P (Signed)
PATIENT NAME:  Chelsea [name], Chelsea [name] MR#:  [id number] DATE OF BIRTH:  [birth year]  DATE OF ADMISSION:  02/17/2013  ATTENDING PHYSICIAN: Harrell Gave A. Onyx Edgley, MD.  REASON FOR ADMISSION: Abdominal pain, nausea, vomiting, diarrhea, portal venous gas on CT scan.   HISTORY OF PRESENT ILLNESS: The patient is a pleasant 81 year old female with a history of multiple medical issues, including CHF, CLL, hypertension and diabetes, who presents with a 3-day history of diarrhea. She says that her diarrhea began suddenly 3 days ago. She has been taking Imodium, which made it stop during the day, but then it would continue the next day. She has a history of Clostridium difficile and does not remember if this has been similar. She has been on antibiotics the last 2 weeks for her leg, for which she has wound care for what sounds like venous stasis ulcers. Over the last 12 hours, she has had vomiting, last vomiting 10 hours ago. She says that she does not feel great right now and describes it as lousy. Otherwise, no fevers, chills, night sweats, shortness of breath, cough, chest pain, current nausea or vomiting, recent diarrhea, dysuria or hematuria.   PAST MEDICAL HISTORY: As follows:  1.  History of arthritis.  2.  CHF.  3.  CLL. Baseline white blood cell count looks approximately like 28,000, high. 4.  History of C. diff.  5.  History of basal cell cancer.  6.  History of glaucoma.  7.  History of hypertension.  8.  History of diabetes.  9.  History of appendectomy.  10. History of hysterectomy.  11. History of chronic renal insufficiency.   HOME MEDICINES: As follows: 1.  Vitamin D 2000 units one tab p.o. daily.  2.  Timolol 0.5 ophthalmologic one drop to each eye b.i.d.  3.  Onglyza 5 mg p.o. daily.  4.  Lyrica 150 mg p.o. t.i.d. 5.  Lasix 40 mg p.o. daily.  6.  Enalapril 2.5 p.o. daily.  7.  Colace 10 mg p.o. b.i.d.  8.  Bacitracin to right eye 500 twice a day.  9.  Aspirin 81 p.o. daily.   ALLERGIES:   1.  DARVOCET.  2.  PENICILLIN.  3.  ALLOPURINOL.  4.  YELLOW GOLD.  5.  PREDNISONE.   SOCIAL HISTORY: Here with husband. Lives at [facility]. Denies current tobacco or alcohol use.   REVIEW OF SYSTEMS: A 12-point review of systems was obtained. Pertinent positives and negatives as above.   PHYSICAL EXAMINATION:  VITAL SIGNS: Temperature 98.8, pulse 86, blood pressure 116/49, respirations 20, 93% on room air.  GENERAL: No acute distress. Alert and oriented x 3.  EYES: No scleral icterus. No conjunctivitis.  HEAD: Normocephalic, atraumatic.  FACE: No obvious facial trauma. Normal external nose. Normal external ears.  CHEST: Lungs clear to auscultation, moving air well.  HEART: Regular rate and rhythm. No murmurs, rubs or gallops.  ABDOMEN: Soft, mildly tender, nondistended.  EXTREMITIES: Moves all extremities well. Strength 5/5.  NEUROLOGIC: Cranial nerves II-XII grossly intact.   LABS: As follows: White cell count of 54.2 but history of CLL, 69% lymphocytes, platelets 113. Labs are significant for a sodium of 143, chloride of 112, BUN of 86, creatinine of 2.61, glucose of 197. LFTs are normal. CT scan shows portal venous air and air within the mesenteric vasculature of the greater curve of the stomach, concerning for pneumatosis.   ASSESSMENT AND PLAN: The patient is a pleasant 81 year old female with diarrhea, nausea and vomiting with a concerning  CT scan. Otherwise, she is normotensive and does not have significant pain. We will admit for hydration. I believe that this is flow-limited ischemia in the context of dehydration. We will rehydrate and reassess. I have explained to her if surgery is necessary the potential morbidity, and she said that she will just think about how aggressive she would want [place] to be, as any aggressive surgery could affect her quality of life.   ____________________________ Glena Norfolk. Amiere Cawley, MD cal:lg D: 02/17/2013 14:39:12 ET T: 02/17/2013  15:13:11 ET JOB#: [id number]  cc: Harrell Gave A. Gwenyth Dingee, MD, <Dictator> Floyde Parkins MD ELECTRONICALLY SIGNED 02/17/2013 17:13

## 2015-02-28 NOTE — Consult Note (Signed)
CC: C. diff colitis infection.  She has stool showing C. diff. Will increase her Flagyl to qid and she will need this for 14 days.  She had C. diff in past in 2008 and responded to one round of antibiotics.  I would stop Cipro at this time.  If you feel she needs it then would treat with the flagyl for 14 days after the cipro has been stopped.   Usually takes 3-4 days to turn around the diarrhea with C. diff if there are no other factors involved.  Electronic Signatures: Manya Silvas (MD)  (Signed on 15-Apr-14 18:48)  Authored  Last Updated: 15-Apr-14 18:48 by Manya Silvas (MD)

## 2015-02-28 NOTE — Consult Note (Signed)
Pt CC is SOB.  She has multiple med problems, was SOB last nite and responded to lasix, had CXR and CT s/w COPD and CHF, chest clear anterior fields now but feels SOB, will give another dose of lasix.  Pt epigastric area not as tender as a few days ago.  On TPN, has una boots on.  Very complex medical problems, poor operative candidate.  May benefit from int medicine consult for her lungs/CHF.  Electronic Signatures: Manya Silvas (MD)  (Signed on 23-Apr-14 17:19)  Authored  Last Updated: 23-Apr-14 17:19 by Manya Silvas (MD)

## 2015-02-28 NOTE — Consult Note (Signed)
Pt CC ischemic stomach.  Her abd is definitely less tender than  on original consult, no pain to deep palpation.  She is going to long term care facility in [place].  She has a BNP of 32,000 and a diagnosis of diastolic CHF.  Multiple medical problems, none of which is likely to greatly improve.  Electronic Signatures: Manya Silvas (MD)  (Signed on 24-Apr-14 17:20)  Authored  Last Updated: 24-Apr-14 17:20 by Manya Silvas (MD)

## 2015-02-28 NOTE — Discharge Summary (Signed)
PATIENT NAME:  Chelsea [name], Chelsea [name] MR#:  [id number] DATE OF BIRTH:  May 25, 1925  DATE OF ADMISSION:  04/11/2013 DATE OF DISCHARGE:  04/13/2013   DISCHARGE DIAGNOSES: 1.  Acute on chronic respiratory failure, likely due to congestive heart failure exacerbation, improving.  2.  Acute on chronic diastolic heart failure with normal ejection fraction on echo, well compensated at this time. 3.  Non-ST elevation myocardial infarction, conservative management per cardiology.  4.  Urinary tract infection based on urinalysis, urine culture showing colonies to be too small to read, improving on Levaquin.  5.  Hypokalemia, repleted and resolved.   SECONDARY DIAGNOSES:   1.  Chronic lymphocytic leukemia. 2.  Clostridium difficile colitis. 3.  Osteoarthritis.  4.  Basal cell carcinoma.  5.  Glaucoma,  6.  Hypertension.  7.  Diabetes.  8.  Chronic kidney disease with a baseline creatinine of 1.8 to 2.5.   CONSULTATION:  1.  Cardiology, Dr. Ubaldo Glassing.  2.  Physical therapy.   PROCEDURES AND RADIOLOGY: Chest x-ray on June 3 showed coarse lung markings in the perihilar and infrahilar region bilaterally, which are not new.   MAJOR LABORATORY PANEL: Urinalysis on admission showed 303 WBCs, 1+ bacteria, 3+ leukocyte esterase cultures, positive nitrite, WBC in clumps present.   Urine culture grew more than 100,000 colonies of gram-negative rod.   Blood cultures x2 were negative.   HISTORY AND SHORT HOSPITAL COURSE: The patient is an 81 year old female with above-mentioned medical problems who was admitted for acute on chronic respiratory failure thought to be secondary to CHF exacerbation. She also ruled in for non-ST elevation MI with troponin peak up to 1.5. Cardiology consultation was obtained with Dr. Ubaldo Glassing who recommended conservative management with heparin, aspirin, beta blocker, which were started and she responded well. The patient was also found to have asymptomatic UTI based on urinalysis, was started on  Levaquin and has responded well. She had hypokalemia, which was repleted and resolved. Her breathing has been much better. She is close to her baseline of 2 to 3 liters of oxygen requirement. She is being discharged to rehab facility based on physical therapy evaluation.   PHYSICAL EXAMINATION:   VITAL SIGNS: On the date of discharge, her vital signs are as follows: Temperature 97.6, heart rate 78 per minute, respiration 18 per minute, blood pressure 134/56 mmHg. She is saturating 97% on 2 to 3 liters of  oxygen via nasal cannula.   CARDIOVASCULAR: S1, S2 normal. No murmurs, rubs or gallop.  LUNGS: Clear to auscultation bilaterally. No wheezing, rales, rhonchi or crepitation.  ABDOMEN: Soft, benign.  NEUROLOGIC: Nonfocal examination. All other physical examination remained at baseline.   DISCHARGE MEDICATIONS: 1.  Lasix 20 mg p.o. daily as needed.  2.  Protonix 40 mg p.o. b.i.d.  3.  Timolol 0.5% ophthalmic drop to each eye twice a day.  4.  Gabapentin 200 mg p.o. daily.  5.  Reglan 10 mg p.o. 3 times a day. 6.  Oxybutynin 5 mg p.o. b.i.d.  7.  RisaQuad twice a day.  8.  Sucralfate 1 gram p.o. 4 times a day. 9.  Insulin Lantus 10 units subcutaneous twice a day.  10.  Aspirin 81 mg p.o. daily.  11.  Metoprolol 25 mg p.o. daily. 12.  Atorvastatin 20 mg p.o. at bedtime.  13.  Oxycodone 5 mg p.o. every 6 hours.   DISCHARGE DIET: Low-sodium low-fat, low-cholesterol.   DISCHARGE ACTIVITY: As tolerated.  DISCHARGE INSTRUCTIONS AND FOLLOWUP: The patient was instructed to follow up  with her primary care physician, Dr. [name], in 1 to 2 weeks. She will need followup with cardiology, Dr. Ubaldo Glassing, in 2 to 4 weeks. She will get physical therapy evaluation and management while at the facility. She will require 2 to liters of oxygen via nasal cannula continuous while at the facility, which is her baseline requirement.  TOTAL TIME DISCHARGING THIS PATIENT: 55  minutes.   ____________________________ Lucina Mellow. Manuella Ghazi, MD vss:jm D: 04/13/2013 14:35:30 ET T: 04/13/2013 15:09:03 ET JOB#: [id number]  cc: Adaleena Mooers S. Manuella Ghazi, MD, <Dictator> Youlanda Roys. Lovie Macadamia, MD Javier Docker Ubaldo Glassing, MD Remer Macho MD ELECTRONICALLY SIGNED 04/16/2013 11:51

## 2015-02-28 NOTE — Consult Note (Signed)
Chief Complaint:  Subjective/Chief Complaint The patient says she does not feel well with some abd. pain and nausea. No further vomiting. She also has had bowel movements yesterday.   VITAL SIGNS/ANCILLARY NOTES: **Vital Signs.:   19-Apr-14 06:12  Vital Signs Type Routine  Temperature Temperature (F) 97.4  Celsius 36.3  Temperature Source oral  Pulse Pulse 68  Respirations Respirations 18  Systolic BP Systolic BP 939  Diastolic BP (mmHg) Diastolic BP (mmHg) 61  Mean BP 89  Pulse Ox % Pulse Ox % 96  Pulse Ox Activity Level  At rest  Oxygen Delivery 2L   Brief Assessment:  Respiratory normal resp effort   Gastrointestinal details normal Soft  Nontender  Tympanic   Assessment/Plan:  Assessment/Plan:  Assessment Nausea with questionable ischemia.   Plan Will get a KUB today. May consider an EGD if KUB not informative.   Electronic Signatures: Lucilla Lame (MD)  (Signed 19-Apr-14 09:20)  Authored: Chief Complaint, VITAL SIGNS/ANCILLARY NOTES, Brief Assessment, Assessment/Plan   Last Updated: 19-Apr-14 09:20 by Lucilla Lame (MD)

## 2015-02-28 NOTE — H&P (Signed)
PATIENT NAME:  Chelsea [name], TIPPY MR#:  [id number] DATE OF BIRTH:  [birth year]  DATE OF ADMISSION:  11/21/2012  PRIMARY CARE PHYSICIAN: Dr. Lovie Macadamia.   ER physician: Dr. Jasmine December.   CHIEF COMPLAINT: Left leg swelling and unable to ambulate.   HISTORY OF PRESENT ILLNESS: An 81 year old female patient with history of CLL,  chronic lymphedema for years in both legs. Also has history of diabetes, glaucoma, came in because patient had a blister on the left leg, which opened up; and because of the swelling  in the left leg which is more than usual, she was unable to walk; and this morning when she went to bathroom she collapsed and husband had to call the staff at [facility], who in turn called 9-1-1. The patient was brought to the emergency room.  The patient and the husband live at independent apartments in [facility]; but because the patient has significant swelling in the left leg and she is unable to ambulate, we are going to admit her.  The patient denies any chest pain. No trouble breathing, no nausea, no vomiting, no fever. She had a recent surgery for reconstruction of her eyelid after the basal cell carcinoma was removed.     PAST MEDICAL HISTORY: Significant for chronic lymphocytic leukemia and she follows up with Dr. Ma Hillock, and she is on surveillance.  Has history of hypertension, diabetes, glaucoma, mild chronic obstructive pulmonary disease, and congestive heart failure.   Allergic to penicillin, Darvocet, allopurinol.   SOCIAL HISTORY: No smoking. No drinking. No drugs.   PAST SURGICAL HISTORY: History of basal cell carcinoma removed by Mohs procedure,  and also history of total abdominal hysterectomy, tonsillectomy, and appendectomy.   MEDICATIONS: Aspirin 81 mg daily;  enalapril 2.5 mg p.o. daily; Lasix 40 mg p.o. daily; Lyrica 150 mg p.o. 3 times daily;   timolol 0.5% ophthalmic one drop each eye 2 times daily; vitamin D 2000 units 1 tablet p.o. daily.   FAMILY HISTORY: No hypertension or  diabetes.   REVIEW OF SYSTEMS:  CONSTITUTIONAL: Has no fever. Has fatigue and weakness.   EYES: No blurred vision.  Had recent surgery for the right eyelid and has stitches, has a history of glaucoma.  EARS, NOSE, AND THROAT: No tinnitus. No ear pain. No epistaxis. No difficulty swallowing.  RESPIRATORY: No cough. No wheezing.  CARDIOVASCULAR: No chest pain. No orthopnea. No PND.  GASTROINTESTINAL: No nausea, no vomiting, no abdominal pain.  GENITOURINARY: No dysuria.  ENDOCRINE: The patient has no polyuria or nocturia.  INTEGUMENTARY: No skin rashes. The patient has both legs swollen and also open areas present on the left leg.  MUSCULOSKELETAL: Has some arthritis.  NEUROLOGIC: No numbness or weakness. No dysarthria.  PSYCHIATRIC: No anxiety or insomnia.   PHYSICAL EXAMINATION:  VITAL SIGNS: Temperature 98.9, heart rate 84, blood pressure 111/47, sats 96% on room air.  GENERAL: She is alert, awake, oriented, obese female, not in distress.  HEAD, EYES, EARS, NOSE, AND THROAT: Head atraumatic, normocephalic. Right eye has stitches and unable to assess PERRLA and EOM. Hearing is intact. Tympanic membranes are clear. Mucous membranes are slightly dry.  NECK: No thyroid enlargement. No JVD. No lymphadenopathy.  RESPIRATORY: Clear to auscultation. No wheeze. No rales.  CARDIOVASCULAR: S1, S2 regular. PMI not displaced.  Good femoral and dorsalis pedis pulses present.  EXTREMITIES: The patient has bilateral extremity edema present up to knees.  ABDOMEN: Soft, nontender, nondistended. Bowel sounds present.  MUSCULOSKELETAL: Strength 5/5 upper extremities and skin has bilateral lower  extremity edema up to the knees and also erythema and open blisters present on the left lateral aspect of the leg.  LYMPH NODES: The patient has chronic lymphedema of both legs.  NEUROLOGIC: Alert, awake, and oriented. Cranial nerves II through XII intact. No dysarthria or aphasia.  PSYCHIATRIC: Oriented to time,  place, person.   Laboratory, diagnostic and radiological data: Urine is amber-colored urine with 1+ blood and leukocyte esterase is 1+ and bacteria none.   The patient's lower extremity ultrasound is negative for deep vein thrombosis.   Chest x-ray shows mild right basilar opacity secondary to atelectasis.   BNP is elevated at 3713. CK total 887. CPK-MB 11.1.  WBC 27.7, hemoglobin 10.9, hematocrit 34.8, platelets 112.   ELECTROLYTES: Sodium is 135, potassium 4.7, chloride 105, bicarbonate 22, BUN 47, creatinine 2.92, glucose 152. The patient's most recent WBC August 27th, was 23.2 and the patient's creatinine was 1.86 on February 27th.   ASSESSMENT AND PLAN:  1.  This is an 81 year old female with cellulitis of the left leg, unable to ambulate. Admit to the hospitalist service on [place].  Start her on vancomycin and Levaquin. Follow the cultures and consult wound care team.  2.  Congestive heart failure: The patient says she has a history of congestive heart failure and an echocardiogram from July 2012,  shows an ejection fraction of more than 55%, so we are  going to get another echocardiogram for followup on that, and looks like acute on chronic diastolic heart failure. Continue her on Lasix and continue lisinopril.  3.  Chronic kidney disease stage III: Her baseline creatinine is around 2 and now it has worsened to 2.92 with BUN of 47. Monitor kidney function closely  hold Ace and lasix.4.  Glaucoma: Continue eyedrops with timolol.   Time Spent ON history and physical: About 55 minutes.   ____________________________ Epifanio Lesches, MD sk:th D: 11/21/2012 20:01:53 ET T: 11/21/2012 20:39:23 ET JOB#: [id number]  cc: Epifanio Lesches, MD, <Dictator> Epifanio Lesches MD ELECTRONICALLY SIGNED 12/25/2012 22:42

## 2015-03-01 NOTE — Consult Note (Signed)
PATIENT NAME:  Chelsea [name], Chelsea [name] MR#:  [id number] DATE OF BIRTH:  [birth year]  DATE OF CONSULTATION:  01/28/2014  REFERRING PHYSICIAN:   CONSULTING PHYSICIAN:  Lollie Sails, MD  Patient of Dr. Anselm Jungling.  REASON FOR CONSULTATION: Obstructive jaundice.   HISTORY OF PRESENT ILLNESS: Ms. Lura Em is a very pleasant 81 year old Caucasian female who states that she was in her usual state of health until about last night. She began to develop some abdominal pain, mostly in the mid to right lower abdomen, which then radiated from the lower abdomen to the upper abdomen, both the right and left sides. She insists it is more so a discomfort than actual pain stating that she "has a high intolerance for pain." She states she has had no nausea or vomiting with this. She states she has been having a regular bowel movement daily until the past few days where she is noted a narrowing of the stool caliber. This was not noted previously. She denies any pain currently. However, there is some right-sided tenderness when questioned multiple. She states that she has noted a darker urine at times over the period of the past month. There has been no acholic stools. She has a history of a hospitalization at [facility] for which she was then transferred to [facility] about a year ago and she states that this had to do with a gastric ulcer. She came to the Emergency Room for further evaluation and was found to have jaundice and abnormal liver testing. I discussed this case earlier this morning with the ER physician who had already gotten an ultrasound but I recommended further imaging study via CT. She had a colonoscopy done 04/25/2006, this showing some diverticulosis, some internal hemorrhoids, and a 6 millimeter polyp removed. She had an EGD done 02/25/2013, this for nausea with vomiting. This showing a small hiatal hernia, mucosal abnormality characterized by ischemia in the gastric fundus likely consistent with recurrent emesis. Normal second  portion of the duodenum.   GASTROINTESTINAL FAMILY HISTORY: Negative for colorectal cancer, liver disease or ulcers.   PAST MEDICAL HISTORY:  1. Congestive heart failure.  2. Chronic lymphocytic leukemia. She is under the care of Dr. Ma Hillock. It has been noted on review of records that her white count has been variable, and she was admitted with a high white count on this occasion.  3. History of C. difficile colitis.  4. Osteoarthritis.  5. Basal cell carcinoma surgically removed from the nose.  6. Glaucoma.  7. Hypertension.  8. Diabetes mellitus type 2.  9. Appendectomy.  10. Hysterectomy.   SOCIAL HISTORY: Remote smoking. Does not use alcohol. She lives with her husband in assisted living.    REVIEW OF SYSTEMS: Ten systems reviewed per admission history and physical, agree with same.   PHYSICAL EXAMINATION: VITAL SIGNS: Temperature 98.8, pulse 77, respirations 18, blood pressure 152/72, pulse oximetry 97%.  GENERAL: She is an 81 year old Caucasian female no acute distress.  HEENT: Normocephalic, atraumatic. Eyes: Anicteric. Nose: Septum midline. No lesions. There is a scar over the nose consistent with her surgical history. Oropharynx: No lesions.  NECK: Supple. No JVD. No lymphadenopathy.  HEART: Regular rate and rhythm.  LUNGS: Clear.  ABDOMEN: Soft. She is tender to palpation in the epigastric region extending into the right upper quadrant. There are no masses, rebound, or organomegaly.  RECTAL: Anorectal exam deferred.  EXTREMITIES: Show 1+ lower extremity edema, chronic lymphedema.  NEUROLOGICAL: Cranial nerves 2 through 12 grossly intact. Muscle strength bilaterally equal and symmetric.  LABORATORY  DATA: On admission to the hospital, she had a glucose of 311. BNP of 4888. BUN 31, creatinine 1.92, sodium 130, potassium 4.3, chloride 96, bicarbonate 24, calcium 9.6, lipase 636. Hepatic profile showing a total protein of 7.3, albumin 3.4, total bilirubin 7.6, alkaline  phosphatase 427, AST 163, ALT 221.  CK-MB less than 0.5. Troponin I x1 less than 0.02. Hemogram showing white count of 42.7, hemoglobin and hematocrit 12.7/39.2, platelet count 210,000 and MCV is 90. Her prothrombin time was 14.4, INR 1.1. She has had blood cultures x2 no growth in 8 to 12 hours.   She had an abdominal ultrasound for right upper quadrant pain showing multiple stones and sludge in the gallbladder, largest stone being 8 millimeter, gallbladder wall  stated normal at 2.2, negative sonographic Murphy sign, common bile duct dilatation to 9.6 millimeter, intrahepatic ductal dilatation as well. CBD stones suspected though not definitely visualized. Liver normal echogenicity. She had a portable chest film showing no active cardiopulmonary disease. She had a CT scan of the abdomen and pelvis without contrast showing a dilated gallbladder containing stones and sludge with some gallbladder wall thickening, pericholecystic fluid, highly suspicious for acute cholecystitis. There were tiny common bile duct stones not excluded although the intrahepatic biliary ducts were top normal in size. There was some splenomegaly, retroperitoneal and pelvic adenopathy consistent with history of CLL, atherosclerotic changes.   ASSESSMENT: Acute cholecystitis with at least partial biliary obstruction from probable choledocholithiasis. The patient is hemodynamically stable. She has been afebrile. She has some minimal discomfort currently.   RECOMMENDATION: Agree with ERCP to be followed by cholecystectomy. I will notify either Dr. Candace Cruise or Dr. Verl Blalock in regards to this patient's need tomorrow. I did try to contact Dr. Candace Cruise today unsuccessfully. Case discussed with Dr. Ezekiel Slocumb.    ____________________________ Lollie Sails, MD [phone] D: 01/28/2014 23:55:39 ET T: 01/29/2014 00:37:23 ET JOB#: [id number]  cc: Lollie Sails, MD, <Dictator> Lollie Sails MD ELECTRONICALLY SIGNED 02/05/2014 13:31

## 2015-03-01 NOTE — Consult Note (Signed)
PATIENT NAME:  Chelsea [name], Chelsea [name] MR#:  [id number] DATE OF BIRTH:  1925/07/04  DATE OF CONSULT:  01/28/2014  ATTENDING PHYSICIAN:  Harrell Gave A. Patton Swisher, MD  REASON FOR CONSULT: Right upper quadrant pain, hyperbilirubinemia, elevated transaminases and alkaline phosphatase, and CT scan concerning for cholecystitis.   HISTORY OF PRESENT ILLNESS:  Chelsea [name] is a pleasant, 81 year old female with a history of multiple medical issues, including CHF, COPD, CLL during a recent admission, even a non-STEMI MI due to increased demand. For the last 2 to 3 days, she started having right upper quadrant pain. She was told to come to the ED initially out of concern that it was due to cardiac issues. In the ED, she was found to have a white cell count of 42,000, which is not unusual for her, as she has had this multiple times over the last year, as well as an elevated bilirubin, AST, ALT, and alk phos. Had an ultrasound which showed gallstones and dilated bile duct, and later a CT which showed what appears to be clinical cholecystitis and dilated bile duct. Otherwise, she is doing fine. No fevers, chills, night sweats, shortness of breath, cough, chest pain, nausea, vomiting, diarrhea, constipation, dysuria, or hematuria.   PAST MEDICAL HISTORY:  1.  History of CHF, diastolic. 2.  CLL, with once-a-year follow up with Dr. Ma Hillock.  3.  History of C. diff colitis.  4.  History of osteoarthritis.  5.  History of basal cell on the nose.  6.  History of glaucoma.  7.  History of hypertension.  8.  History of diabetes mellitus.  9.  History of gastric emphysema, which resolved spontaneously. 10.  History of appendectomy.  11.  History of hysterectomy.   HOME MEDICATIONS:  1.  Vitamin D3. 2.  VESIcare.  3.  Timolol. 4.  Carafate. 5.  Oxybutynin. 6.  Onglyza. 7.  Metoprolol. 8.  Losartan. 9.  Lasix.  10.  Iron sulfate.  11.  Enalapril.  12.  Atorvastatin.  13.  Aspirin.  SOCIAL HISTORY: History of prior  tobacco use. No obvious alcohol or illicit drug use.   FAMILY HISTORY: Hypertension and diabetes.   PHYSICAL EXAMINATION IS AS FOLLOWS: VITAL SIGNS: Temperature 96.9, pulse 70, blood pressure 154/63, respirations 18, 98% on room air.  GENERAL: No acute distress. Alert and oriented x 3. HEAD:  Normocephalic, atraumatic. EYES:  No obvious scleral icterus despite bilirubin of 7.4 and artificial light. Does appear to have slightly jaundiced skin. No conjunctivitis.  FACE:  No obvious facial trauma. Normal external nose, normal external ears. CHEST: Lungs clear to auscultation, moving air well.  HEART: Regular rate and rhythm. No murmurs, rubs or gallops.  ABDOMEN: Soft. Tender to palpation, right upper quadrant. Nondistended.  EXTREMITIES: Moves all extremities well. Strength 5/5.   NEUROLOGIC: Cranial nerves II through XII grossly intact.   LABS:  White cell count of 42.7, which is elevated most recently during last follow up with Dr. Ma Hillock, but has had white cell counts in the 50s on numerous occasions over the last year. Lipase of 636, bilirubin is 7.6, alk phos of 427, AST 163, ALT elevated Creatinine of 1.92. Ultrasound shows a thickened gallbladder wall with gallstones. CT shows a thickened gallbladder wall, gallstones, and pericholecystic stranding.   ASSESSMENT AND PLAN: Chelsea [name] is a pleasant, 81 year old female with history of multiple medical issues, who presents with what looks like cholecystitis and choledocholithiasis. She is not having fevers, and elevated white cell count. It is confounding; do  not think patient has cholangitis, but likely has obstructive jaundice. Would require ERCP prior to cholecystectomy.   Will continue to follow.    ____________________________ Glena Norfolk Wallace Cogliano, MD cal:mr D: 01/28/2014 18:16:13 ET T: 01/28/2014 19:07:49 ET JOB#: [id number]  cc: Harrell Gave A. Javaris Wigington, MD, <Dictator> Floyde Parkins MD ELECTRONICALLY SIGNED 02/06/2014  12:26

## 2015-03-01 NOTE — Op Note (Signed)
PATIENT NAME:  Chelsea, [name] MR#:  [id number] DATE OF BIRTH:  [birth year]  DATE OF PROCEDURE:  01/31/2014  PREOPERATIVE DIAGNOSES: Acute cholecystitis, choledocholithiasis, gallstone pancreatitis.    POSTOPERATIVE DIAGNOSES: Acute cholecystitis, choledocholithiasis, gallstone pancreatitis.    PROCEDURE PERFORMED: Laparoscopic cholecystectomy.   ANESTHESIA: General endotracheal.   ESTIMATED BLOOD LOSS:  50 mL.   COMPLICATIONS: None.   SPECIMENS: Gallbladder.   INDICATIONS FOR SURGERY: [name] is a pleasant 81 year old female who presented with acute onset abdominal pain and jaundice. She had undergone ERCP and all sludge was cleared. She did have postop pancreatitis, which resolved. She was thus brought to the operating room suite to address cholecystitis and for prophylactic recurrence of pancreatitis and choledocholithiasis.   DETAILS OF PROCEDURE: Are as follows:  Informed consent was obtained. [name] was brought to the operating room suite. She was induced. Endotracheal tube was placed, general anesthesia was administered. Her abdomen was then prepped and draped in standard surgical fashion. A timeout was then performed correctly identifying patient name, operative site and procedure to performed.  A supraumbilical incision was made. This was deepened down to the fascia. The fascia was incised. The peritoneum was entered. Two stay sutures placed through the fasciotomy. A Hasson trocar was placed in the abdomen. There were significant amounts of scar to the undersurface of the peritoneum. A Hasson trocar was carefully placed in the abdomen. The abdomen was insufflated. The gallbladder, which was palpated on the external examination prior to surgery, was noted to be extremely adhesed to the surrounding tissue. With some difficulty, I was able to dissect all of the tissue off the gallbladder and isolate the cystic duct. It was wide and quite short and the common bile duct was easily visualized.  I was able to place a laparoscopic stapler across the cystic duct and after critical view was obtained, 3 clips were placed across the cystic artery, and this was ligated as well. The gallbladder was then carefully taken off the gallbladder fossa and was brought out through an Endo Catch bag. The gallbladder fossa was then made hemostatic. Due to the fact that the duct was very short and wide, I did place a 19-French JP through the lateralmost part. I then irrigated the abdomen with 3 liters of normal saline. The supraumbilical fascia was then closed. The skin was closed with interrupted 4-0 Monocryl suture. Steri-Strips, Telfa gauze and Tegaderm were then used to complete the dressing. The patient was then awoken, extubated and brought to the postanesthesia care unit. There were no immediate complications. Needle, sponge and instrument counts were correct at the end of the procedure.    ____________________________ Glena Norfolk. Deondra Wigger, MD cal:dmm D: 01/31/2014 16:09:26 ET T: 01/31/2014 19:17:47 ET JOB#: [id number]  cc: Harrell Gave A. Delontae Lamm, MD, <Dictator> Floyde Parkins MD ELECTRONICALLY SIGNED 02/06/2014 12:27

## 2015-03-01 NOTE — H&P (Signed)
PATIENT NAME:  Chelsea [name], Chelsea [name] MR#:  [id number] DATE OF BIRTH:  1925/09/03  DATE OF ADMISSION:  01/28/2014  PRIMARY CARE PHYSICIAN: Youlanda Roys. Lovie Macadamia, MD  PRIMARY CARDIOLOGIST: Isaias Cowman, MD  PRIMARY ONCOLOGIST: Rhett Bannister Ma Hillock, MD  REFERRING EMERGENCY ROOM PHYSICIAN: Algis Liming. Jimmye Norman, MD  CHIEF COMPLAINT: Epigastric pain.   HISTORY OF PRESENT ILLNESS: An 81 year old female who has a history of congestive heart failure, diastolic type, chronic lymphocytic leukemia, on routine followup with Dr. Ma Hillock once a year for that; history of basal cell carcinoma surgically treated, hypertension, diabetes and who is leading an active life. For the last 2 to 3 days, she started noticing some discomfort in her abdomen, mainly on the right side.  It was also coming to epigastric or lower chest region which is more kind of discomfort and not much pain or shooting or sharp pain. She denies any nausea, vomiting, diarrhea also. She denies any palpitation or shortness of breath with that. Concerned because of this pain, constant and nagging pain, she called Dr. Saralyn Pilar' office today and he suggested to go to the Emergency Room for further evaluation. On workup in ER, she was found having elevated bilirubin and on sonogram had sludge and gallstone. ER physician spoke to Dr. Gustavo Lah and he suggested to admit and get a CAT scan and might have to do procedure to relieve the stone, most likely due to obstructive jaundice. On further questioning, she denies any similar history in the past. She denies any fever or chills and any loss of appetite.   REVIEW OF SYSTEMS:    CONSTITUTIONAL: Negative for fever, fatigue, weakness, pain or weight loss.  EYES: No blurring, double vision, discharge or redness.  EARS, NOSE, THROAT: No tinnitus, ear pain or hearing loss.  RESPIRATORY: No cough, wheezing, hemoptysis or shortness of breath.  CARDIOVASCULAR: No chest pain, orthopnea, edema, arrhythmia or palpitations.   GASTROINTESTINAL: :The patient does not have any nausea, vomiting, diarrhea but has some nagging discomfort type of pain on epigastric and right side of the abdomen.  GENITOURINARY: No dysuria, hematuria or increased frequency of urination.  ENDOCRINE: No increased sweating, heat or cold intolerance.  SKIN: No acne, rashes or lesions. She did not notice any change in her skin color but she appears very yellow.  MUSCULOSKELETAL: No pain or swelling in the joints.  NEUROLOGICAL: No numbness, weakness, tremor or vertigo.  PSYCHIATRIC: No anxiety, insomnia, bipolar disorder.   PAST MEDICAL HISTORY: 1.  Congestive heart failure, diastolic.  2.  Chronic lymphocytic leukemia on once-a-year followup with Dr. Ma Hillock in office. Last followup was 2nd of March 2015, where her white cell count was 14,000.  3.  History of C. diff colitis.  4.  Osteoarthritis.  5.  History of basal carcinoma, surgically treated on her nose.  6.  Glaucoma.  7.  Hypertension.  8.  Diabetes mellitus.   PAST SURGICAL HISTORY: 1.  Appendectomy.  2.  Hysterectomy.  3.  Basal cell carcinoma surgery on the nose.   SOCIAL HISTORY: History of smoking for 25 years, 1-1/2 packs every day. She started when she was teenage and stopped in 1972. Denies drinking alcohol or illicit drug use. She lives at [facility] assisted living facility with her husband.   FAMILY HISTORY: Hypertension and diabetes.   HOME MEDICATION:  1.  Vitamin D3 1000 international units once a day.  2.  VESIcare 5 mg oral tablet once a day.  3.  Timolol ophthalmic solution to both eyes once a  day.  4.  Sucralfate 1 gram oral tablet 4 times a day.  5.  Oxybutynin 5 mg oral 2 times a day.  6.  Onglyza 5 mg oral tablet once a day.  7.  Metoprolol 25 mg oral extended-release once a day.  8.  Losartan 50 mg oral once a day.  9.  Furosemide 20 mg once a day.  10.  Ferrous sulfate 325 mg once a day.  11.  Enalapril 2.5 mg oral tablet once a day. 12.   Atorvastatin 20 mg oral once a day.  13.  Aspirin 81 mg once a day.   PHYSICAL EXAMINATION: VITAL SIGNS: In ER, temperature 96.9, pulse rate 74, respirations 18, blood pressure 126/56 and pulse ox of 94% on room air.  GENERAL: The patient is fully alert and oriented to time, place and person. She does not appear in any acute distress.  HEAD AND NECK:  Atraumatic. Conjunctiva pink. Sclerae yellow. Oral mucosa moist.  NECK: Supple. No JVD.  RESPIRATORY: Bilateral clear and equal air entry.  CARDIOVASCULAR: S1, S2 present, regular. No murmur. There is a scar of surgery present on right side of upper chest.  ABDOMEN: Soft. Mild tenderness in right upper quadrant. No organomegaly felt. Bowel sounds normal.  SKIN: Yellowish discoloration of skin all over the body. No rashes.  LEGS: Edema present in both sides, chronic. She is wearing elastic stockings.  JOINTS: No swelling or tenderness.  NEUROLOGICAL: Moves all 4 limbs. Power 5 out of 5. Follows commands. No tremors.  PSYCHIATRIC: Does not appear in any acute psychiatric illness at this time.   LABORATORY, DIAGNOSTIC AND RADIOLOGICAL DATA:  Glucose 311. BNP is 4888. BUN 31, creatinine 1.92. Creatinine was 1.37 on March 2015, 2nd March, and was 0.78 in April 2014, which appears to be her baseline; sodium 130, potassium 4.3, chloride 96, CO2 24. Lipase 636, total protein 7.3, bilirubin 7.6, alkaline phosphatase 427, SGOT 163 and SGPT 221. Troponin less than 0.02. WBC 14,600 on March 2nd, today is 42,700, platelets 210. INR is 1.1. Abdomen sonography is done which showed gallbladder sludge with multiple gallstones, biliary ductal dilation may be secondary to common duct stone.   ASSESSMENT AND PLAN: An 81 year old female with past history of chronic lymphocytic leukemia, congestive heart failure, hypertension and diabetes who came with not feeling well and having some abdominal discomfort for the last few days, noticed having yellowish discoloration and  elevated bilirubin.  1.  Obstructive jaundice. There is gallstone and dilated ducts so most likely it is common bile duct stone causing obstructive jaundice. ER physician spoke to Dr. Gustavo Lah, was is on call gastroenterology and suggested to get a CAT scan of abdomen with oral contrast. We will avoid giving IV contrast because of her elevated creatinine level. Meanwhile, we will keep her n.p.o., give her the Protonix and IV fluids to help and let gastroenterology do further planning. Monitor CMP tomorrow.  2.  Acute renal failure, baseline creatinine 0.78 in April 2014. We will give her some IV fluid. Maybe this is secondary to not eating enough after having that epigastric discomfort and pain. Give IV fluids and monitor.  3.  Hypertension. Currently blood pressure is stable and she would be kept n.p.o. so we would like to avoid losartan due to renal failure issue but will continue enalapril.  4.  Hyperlipidemia. Continue atorvastatin.  5.  Diabetes. We will not give oral antidiabetic medication because of n.p.o. but we will put her on sliding scale insulin coverage.  6.  Elevated WBC. Currently, there is no source of infection but in setting of sudden worsening in white cell count in the last 20 days and presentation with gallstone obstruction, would like to meropenem for now after CAT scan. If it does not show there is any acute cholecystitis, then maybe we might be able to discontinue it.  7.  Elevated lipase. There might be a component of pancreatitis with this common bile duct stone. I will let gastroenterology do further workup. Currently, will give pain medication and keep on IV fluids and n.p.o.  8.  CODE STATUS: Full code.   TOTAL TIME SPENT ON THIS ADMISSION: 50 minutes.   ____________________________ Ceasar Lund Anselm Jungling, MD vgv:cs D: 01/28/2014 14:09:33 ET T: 01/28/2014 14:52:35 ET JOB#: [id number]  cc: Ceasar Lund. Anselm Jungling, MD, <Dictator> Youlanda Roys. Lovie Macadamia, MD Vaughan Basta MD ELECTRONICALLY SIGNED 02/05/2014 18:29

## 2015-03-01 NOTE — Discharge Summary (Signed)
PATIENT NAME:  Chelsea [name], Chelsea [name] MR#:  [id number] DATE OF BIRTH:  1924-12-28  DATE OF ADMISSION:  01/28/2014 DATE OF DISCHARGE:  02/04/2014  For a detailed note, please take a look at the history and physical done on admission by Dr. Anselm Jungling.   DISCHARGE DIAGNOSES: As follows:  1.  Acute cholecystitis status post laparoscopic cholecystectomy. 2.  Suspected choledocholithiasis status post ERCP, but ruled out. 3.  Acute renal failure.  4.  Diabetes. 5.  Hypertension. 6.  Hyperlipidemia. 7.  Glaucoma.  8.  Urinary incontinence.  9.  Chronic lymphocytic leukemia. 10.  Post-ERCP Pancreatitis  DIET: The patient is being discharged on a low-sodium, low-fat, American Diabetic Association diet.   ACTIVITY: As tolerated.   DISCHARGE FOLLOWUP: With Dr. [name] in the next 1 to 2 weeks. Also follow up with Dr. Rexene Edison in the next 1 week.   DISCHARGE MEDICATIONS:  1.  Lasix 20 mg daily. 2.  Timolol 0.5 ophthalmic solution 1 drop to the effected eye b.i.d. 3.  Oxybutynin 5 mg b.i.d.  4.  Sucralfate 1 gram q.i.d. 5.  Aspirin 81 mg daily. 6.  Metoprolol succinate 25 mg daily. 7.  Atorvastatin 20 mg at bedtime. 8.  Onglyza 5 mg daily. 9.  Enalapril 2.5 mg daily. 10.  Vitamin D 1000 international units daily. 11.  Iron sulfate 325 mg daily. 12.  Losartan 50 mg daily. 13.  VESIcare 5 mg daily. 14.  Tylenol with hydrocodone 5/325 mg one tab q. 6 hours as needed.   CONSULTANTS DURING HOSPITAL COURSE: Dr. Lucilla Lame from gastroenterology, Dr. Marlyce Huge from general surgery.   PERTINENT STUDIES DONE DURING THE HOSPITAL COURSE: CT scan of the abdomen and pelvis done on admission showing dilated gallbladder containing stones and sludge with gallbladder wall thickening. Pericholecystic fluid suspicious for acute cholecystitis. Tiny common bile duct stone not excluded. Intrahepatic biliary ducts, normal size. Sternocleidomastoid and retroperitoneal pelvic lymphadenopathy consistent  with the patient's history of chronic lymphocytic leukemia.    Chest x-ray done showing no acute cardiopulmonary disease.   Ultrasound of the abdomen done showing gallbladder sludge with multiple gallstones, biliary ductal dilatation, maybe secondary to common duct stone.   ERCP done on March 24th which showed no evidence of choledocholithiasis but just sludge.   Laparoscopic cholecystectomy done on 01/31/2014.   HOSPITAL COURSE: This is an 81 year old female with medical problems as mentioned above who presented to the hospital on January 28, 2014 due to right upper quadrant abdominal pain and obstructive jaundice.  1.  Right upper quadrant abdominal pain with abnormal LFTs and obstructive jaundice. When the patient was initially admitted to the hospital, she was noted to have elevated LFTs with a total bilirubin of 7.6. Her CT scan and ultrasound findings were suggestive of acute cholecystitis, but there was also some mention of a common bile duct stone. Initially, the patient was seen by gastroenterology and underwent ERCP which ruled out choledocholithiasis. Therefore, most likely the cause of her symptoms was acute cholecystitis. The patient was empirically started on meropenem. A surgical consult was also obtained. Since the choledocholithiasis was ruled out, the patient underwent laparoscopic cholecystectomy, done on January 31, 2014, which she tolerated well. She is currently postop day number 4, doing well. Her diet has been advanced from a clear liquid to a regular diet, which she is currently tolerating. She had possibly a small ileus which improved with some laxatives. She has had 2 bowel movements now since last 24 hours. Her pain is better controlled.  She is therefore being discharged with follow-up with surgery as an outpatient. Her LFTs have significantly improved and normalized since admission and her bilirubin has come down.  2.  Choledocholithiasis. This was also the suspected diagnosis on  admission as the patient was noted to have a suspected common bile duct stone on ultrasound and CT, although the patient underwent ERCP which ruled out choledocholithiasis.  3.  Acute renal failure. This was likely secondary to ATN from her volume depletion from her cholecystitis. The patient was hydrated with IV fluids. Her creatinine has now normalized and is at baseline.  4.  Hypertension. The patient has remained hemodynamically stable in the hospital. She will continue her enalapril as stated.  5.  Leukocytosis. This has been a chronic problem for the patient. She has underlying CLL. Her white cell count on admission was 42,000 and has come down to as low as 22. This can be further followed as an outpatient.  6.  Hyperlipidemia. The patient was maintained on Lipitor. She will resume that. 7.  Glaucoma. The patient was maintained on Timolol. She will resume that.  8.  Diabetes. While in the hospital, the patient was maintained on sliding scale insulin. She will continue her Onglyza upon discharge.  9. Post-ERCP Pancreatitis - patient developed some elevated Lipase after her ERCP conssistent with pancreatitis.  It was treated supportively with IV fluids and pain control.  Lipase improved in a few days and it has resolved now upon discharge.    CODE STATUS: The patient is a FULL code.   DISPOSITION: She is being discharged to a skilled nursing facility as she needs ongoing rehab.   TIME SPENT ON DISCHARGE: 40 minutes.   ____________________________ Belia Heman. Verdell Carmine, MD vjs:sb D: 02/04/2014 11:38:20 ET T: 02/04/2014 12:00:19 ET JOB#: [id number]  cc: Belia Heman. Verdell Carmine, MD, <Dictator> Youlanda Roys. Lovie Macadamia, MD Glena Norfolk Rexene Edison, MD Henreitta Leber MD ELECTRONICALLY SIGNED 02/06/2014 14:51

## 2015-03-01 NOTE — Consult Note (Signed)
Chief Complaint:  Subjective/Chief Complaint feeling better this pm compared to this am.  no nausea, mild epigastric and ruq/to rlq abdominal pain. positive bm.   VITAL SIGNS/ANCILLARY NOTES: **Vital Signs.:   25-Mar-15 06:07  Temperature Temperature (F) 98.6  Celsius 37  Temperature Source oral  Pulse Pulse 75  Respirations Respirations 18  Systolic BP Systolic BP 967  Diastolic BP (mmHg) Diastolic BP (mmHg) 66  Mean BP 95  Pulse Ox % Pulse Ox % 99  Pulse Ox Activity Level  At rest  Oxygen Delivery 2L   Lab Results: Hepatic:  23-Mar-15 11:28   Bilirubin, Total  7.6  Alkaline Phosphatase  427 (45-117 NOTE: New Reference Range 09/28/13)  SGPT (ALT)  221  SGOT (AST)  163  24-Mar-15 04:59   Bilirubin, Total -  Bilirubin, Total  6.0  Alkaline Phosphatase - (45-117 NOTE: New Reference Range 09/28/13)  Alkaline Phosphatase  435 (45-117 NOTE: New Reference Range 09/28/13)  SGPT (ALT) -  SGPT (ALT)  176  SGOT (AST) -  SGOT (AST)  178  Bilirubin, Direct  4.9 (Result(s) reported on 29 Jan 2014 at 07:17AM.)  Bilirubin, Direct -  25-Mar-15 04:48   Bilirubin, Total  3.3  Alkaline Phosphatase  452 (45-117 NOTE: New Reference Range 09/28/13)  SGPT (ALT)  126  SGOT (AST)  105  Total Protein, Serum  5.8  Albumin, Serum  2.4  Routine Chem:  25-Mar-15 04:48   Glucose, Serum  134  BUN  31  Creatinine (comp)  1.33  Sodium, Serum  135  Potassium, Serum 3.8  Chloride, Serum 104  CO2, Serum 24  Calcium (Total), Serum 8.8  Osmolality (calc) 279  eGFR (African American)  42  eGFR (Non-African American)  36 (eGFR values <66m/min/1.73 m2 may be an indication of chronic kidney disease (CKD). Calculated eGFR is useful in patients with stable renal function. The eGFR calculation will not be reliable in acutely ill patients when serum creatinine is changing rapidly. It is not useful in  patients on dialysis. The eGFR calculation may not be applicable to patients at the low and  high extremes of body sizes, pregnant women, and vegetarians.)  Anion Gap 7  Lipase  7678 (Result(s) reported on 30 Jan 2014 at 06:16AM.)  Routine Hem:  23-Mar-15 11:28   WBC (CBC)  42.7  24-Mar-15 04:59   WBC (CBC)  20.6  25-Mar-15 04:48   WBC (CBC)  24.7  RBC (CBC)  3.68  Hemoglobin (CBC)  10.9  Hematocrit (CBC)  33.2  Platelet Count (CBC) 150  MCV 90  MCH 29.7  MCHC 32.9  RDW  16.5  Neutrophil % 32.5  Lymphocyte % 63.5  Monocyte % 3.7  Eosinophil % 0.1  Basophil % 0.2  Neutrophil #  8.0  Lymphocyte #  15.7  Monocyte # 0.9  Eosinophil # 0.0  Basophil # 0.1 (Result(s) reported on 30 Jan 2014 at 07:04AM.)   Radiology Results: [place]    23-Mar-15 12:45, [place]  [place]   REASON FOR EXAM:    RUQ pain  COMMENTS:   Body Site: Gallbladder, Liver, Common Bile Duct    PROCEDURE: [place] - [place] SURVEY  - Jan 28 2014 12:45PM     CLINICAL DATA:  Right upper quadrant pain    EXAM:  [place] - RIGHT UPPER QUADRANT    COMPARISON:  CT abdomen 02/17/2013    FINDINGS:  Gallbladder:  Distended gallbladder containing sludge and gallstones.  Largest  stone is 8 mm. Gallbladder wall is normal at 2.2 mm. Negative  sonographic Murphy sign    Common bile duct:    Diameter: Biliary ductal dilatation. Common bile duct 9.6 mm.  Intrahepatic ductal dilatation also present. Common duct stone is  suspected however not definitely visualized.    Liver:    No focal lesion identified. Within normal limits in parenchymal  echogenicity.   IMPRESSION:  Gallbladder sludge with multiple gallstones.    Biliary ductal dilatation may be secondary to a common duct stone.      Electronically Signed    By: Franchot Gallo M.D.    On: 01/28/2014 12:48         Verified By: Truett Perna, M.D.,  CT:    23-Mar-15 16:57, CT Abdomen and Pelvis Without Contrast  CT Abdomen and Pelvis Without Contrast   REASON FOR EXAM:    (1) RUQ pain;  (2) RUQ pain  COMMENTS:       PROCEDURE: CT  - CT ABDOMEN AND PELVIS W0  - Jan 28 2014  4:57PM     CLINICAL DATA:  Right upper quadrant pain for the past day. Nausea  vomiting and diarrhea. Post appendectomy and hysterectomy.    EXAM:  CT ABDOMEN AND PELVIS WITHOUT CONTRAST    TECHNIQUE:  Multidetector CT imaging of the abdomen and pelvis was performed  following the standard protocol without intravenous contrast.  COMPARISON:  01/28/2014 ultrasound.  02/07/2013 CT.    FINDINGS:  Dilated gallbladder containing stones and sludge with gallbladder  wall thickening pericholecystic fluid highly suspicious for acute  cholecystitis.    Tiny common bile duct stone not excluded. Intrahepatic biliary ducts  top-normal in size.    Splenomegaly and retroperitoneal/ pelvic adenopathy consistent with  patient's history of chronic lymphocytic leukemia. Findings  relatively similar to prior exam.    Taking into account limitation by non contrast imaging, No focal  worrisome hepatic, splenic, pancreatic, adrenal or renal lesion.  Renal cysts are noted.    Atherosclerotic type changes abdominal aorta are and aortic branch  vessels. At the aortic bifurcation, atherosclerotic type changes  cause narrowing of the takeoff of the common iliac arteries.    Coronary artery calcifications.  Heart size within normal limits    Degenerative changes lumbar spine with grade 1 anterior slip L4 felt  to be related to facet joint degenerative changes contributing to  significant spinal stenosis and foraminal narrowing.    No free intraperitoneal air.  Scarring lung bases.     IMPRESSION:  Dilated gallbladder containing stones/ sludge with gallbladder wall  thickening pericholecystic fluid highly suspicious for acute  cholecystitis.    Tiny common bile duct stone not excluded. Intrahepatic biliary ducts  top-normal in size.    Splenomegaly and retroperitoneal/ pelvic adenopathy consistent  with  patient's history of chronic lymphocytic leukemia. Findings  relatively similar to prior exam.    Atherosclerotic type changes abdominal aorta are and aortic branch  vessels. At the aortic bifurcation, atherosclerotic type changes  cause narrowing of the takeoff of the common iliac arteries.    Coronary artery calcifications.    Multifactorial L4-5 significant spinal stenosis and foraminal  narrowing.    These results were called by telephone at the time of interpretation  on 01/28/2014 at 5:07 PM to Dr. Boyce Medici who verbally acknowledged  these results.      Electronically Signed    By: Chauncey Cruel M.D.    On: 01/28/2014 17:23  Verified By: Doug Sou, M.D.,   Assessment/Plan:  Assessment/Plan:  Assessment 1) gallstones, probable cholelithiasis, possible choledocholithiasis with dilated cbd,  2) s/p ercp with removal of sludge from the cbd. mild post ercp pancreatitis.   Plan 1) continue supportive care, elective CCY once pancreatitis clears, IP vs OP per surgeon/patient.   Electronic Signatures: Loistine Simas (MD)  (Signed 25-Mar-15 12:40)  Authored: Chief Complaint, VITAL SIGNS/ANCILLARY NOTES, Lab Results, Radiology Results, Assessment/Plan   Last Updated: 25-Mar-15 12:40 by Loistine Simas (MD)

## 2015-03-01 NOTE — Consult Note (Signed)
Brief Consult Note: Diagnosis: obstructive jaundice.   Patient was seen by consultant.   Consult note dictated.   Recommend to proceed with surgery or procedure.   Orders entered.   Comments: Please see full GI consult [phone].  Patient admitted with relatively acute onset of ruq pain and noticable jaundice.  Hemodynamically stable, AF.  WBC elevated in the setting of h/o CLL.  Agree with ERCP and cholecystectomy.  I will notify Dr Candace Cruise or Dr Allen Norris in the am for this proceedure.  Discussd with Dr Rexene Edison.  Electronic Signatures: Loistine Simas (MD)  (Signed 23-Mar-15 23:59)  Authored: Brief Consult Note   Last Updated: 23-Mar-15 23:59 by Loistine Simas (MD)

## 2015-04-18 ENCOUNTER — Ambulatory Visit: Payer: Medicare Other

## 2015-04-22 ENCOUNTER — Ambulatory Visit: Payer: Medicare Other

## 2015-04-22 ENCOUNTER — Telehealth: Payer: Self-pay | Admitting: *Deleted

## 2015-04-22 NOTE — Telephone Encounter (Signed)
Pt states she missed her appt for a 2nd time, please call.

## 2015-04-29 ENCOUNTER — Ambulatory Visit (INDEPENDENT_AMBULATORY_CARE_PROVIDER_SITE_OTHER): Payer: Medicare Other | Admitting: Podiatry

## 2015-04-29 DIAGNOSIS — M79673 Pain in unspecified foot: Secondary | ICD-10-CM | POA: Diagnosis not present

## 2015-04-29 DIAGNOSIS — B351 Tinea unguium: Secondary | ICD-10-CM | POA: Diagnosis not present

## 2015-04-29 NOTE — Progress Notes (Signed)
Subjective: 81 y.o.-year-old female returns the office today for painful, elongated, thickened toenails which she is unable to trim herself. Denies any redness or drainage around the nails. Denies any acute changes since last appointment and no new complaints today. Denies any systemic complaints such as fevers, chills, nausea, vomiting.   Objective: AAO 3, NAD DP/PT pulses palpable, CRT less than 3 seconds Protective sensation intact with Simms Weinstein monofilament  Nails hypertrophic, dystrophic, elongated, brittle, discolored 10. There is tenderness overlying the nails 1-5 bilaterally. There is no surrounding erythema or drainage along the nail sites. No open lesions or pre-ulcerative lesions are identified. No other areas of tenderness bilateral lower extremities. No overlying edema, erythema, increased warmth. No pain with calf compression, swelling, warmth, erythema.  Assessment: Patient presents with symptomatic onychomycosis  Plan: -Treatment options including alternatives, risks, complications were discussed -Nails sharply debrided 10 without complication/bleeding. -Discussed daily foot inspection. If there are any changes, to call the office immediately.  -Follow-up in 3 months or sooner if any problems are to arise. In the meantime, encouraged to call the office with any questions, concerns, changes symptoms.

## 2015-07-31 ENCOUNTER — Ambulatory Visit (INDEPENDENT_AMBULATORY_CARE_PROVIDER_SITE_OTHER): Payer: Medicare Other | Admitting: Podiatry

## 2015-07-31 ENCOUNTER — Encounter: Payer: Self-pay | Admitting: Podiatry

## 2015-07-31 ENCOUNTER — Ambulatory Visit: Payer: Medicare Other

## 2015-07-31 DIAGNOSIS — M79673 Pain in unspecified foot: Secondary | ICD-10-CM | POA: Diagnosis not present

## 2015-07-31 DIAGNOSIS — B351 Tinea unguium: Secondary | ICD-10-CM | POA: Diagnosis not present

## 2015-07-31 NOTE — Progress Notes (Signed)
Subjective: 81 year-old female returns the office today for painful, elongated, thickened toenails which she is unable to trim herself. Denies any redness or drainage around the nails. Denies any acute changes since last appointment and no new complaints today. Denies any systemic complaints such as fevers, chills, nausea, vomiting.   Objective: AAO 3, NAD DP/PT pulses palpable, CRT less than 3 seconds Protective sensation intact with Simms Weinstein monofilament  Nails hypertrophic, dystrophic, elongated, brittle, discolored 10. There is tenderness overlying the nails 1-5 bilaterally. There is no surrounding erythema or drainage along the nail sites. No open lesions or pre-ulcerative lesions are identified. No other areas of tenderness bilateral lower extremities. No overlying edema, erythema, increased warmth. No pain with calf compression, swelling, warmth, erythema.  Assessment: Patient presents with symptomatic onychomycosis  Plan: -Treatment options including alternatives, risks, complications were discussed -Nails sharply debrided 10 without complication/bleeding. -Discussed daily foot inspection. If there are any changes, to call the office immediately.  -Follow-up in 3 months or sooner if any problems are to arise. In the meantime, encouraged to call the office with any questions, concerns, changes symptoms.  Celesta Gentile, DPM

## 2015-10-30 ENCOUNTER — Ambulatory Visit: Payer: Medicare Other

## 2015-10-30 ENCOUNTER — Ambulatory Visit: Payer: Medicare Other | Admitting: Podiatry

## 2015-10-30 ENCOUNTER — Ambulatory Visit (INDEPENDENT_AMBULATORY_CARE_PROVIDER_SITE_OTHER): Payer: Medicare Other | Admitting: Podiatry

## 2015-10-30 ENCOUNTER — Encounter: Payer: Self-pay | Admitting: Podiatry

## 2015-10-30 DIAGNOSIS — M79673 Pain in unspecified foot: Secondary | ICD-10-CM

## 2015-10-30 DIAGNOSIS — B351 Tinea unguium: Secondary | ICD-10-CM

## 2015-10-30 NOTE — Progress Notes (Signed)
Subjective: 81 year-old female returns the office today for painful, elongated, thickened toenails which she is unable to trim herself. Denies any redness or drainage around the nails. Denies any acute changes since last appointment and no new complaints today. Denies any systemic complaints such as fevers, chills, nausea, vomiting.   Objective: AAO 3, NAD DP/PT pulses palpable, CRT less than 3 seconds Protective sensation intact with Simms Weinstein monofilament  Nails hypertrophic, dystrophic, elongated, brittle, discolored 10. There is tenderness overlying the nails 1-5 bilaterally. There is no surrounding erythema or drainage along the nail sites.  on the left anterior leg about midway is a small superficial granular wound measuring approximately 0.5 x 0.5 cm. No swelling erythema, ascending cellulitis , flexes, crepitus, malodor, drainage or other signs of infection. Subjectively she states this area has been going on and off for quite some time. She does have chronic swelling to her legs. No open lesions or pre-ulcerative lesions are identified. No other areas of tenderness bilateral lower extremities. No overlying edema, erythema, increased warmth. No pain with calf compression, swelling, warmth, erythema.  Assessment: Patient presents with symptomatic onychomycosis  Plan: -Treatment options including alternatives, risks, complications were discussed -Nails sharply debrided 10 without complication/bleeding. -Wound likely due to venous stasis. Continued antibiotic ointment dressing changes. If the wound is not healed in 2 weeks to call the office. Monitor for any clinical signs or symptoms of infection and directed to call the office immediately should any occur or go to the ER. Compression stockings.  -Discussed daily foot inspection. If there are any changes, to call the office immediately.  -Follow-up in 3 months or sooner if any problems are to arise. In the meantime, encouraged to  call the office with any questions, concerns, changes symptoms.  Celesta Gentile, DPM

## 2015-11-06 ENCOUNTER — Encounter: Payer: Self-pay | Admitting: *Deleted

## 2015-12-24 DIAGNOSIS — E785 Hyperlipidemia, unspecified: Secondary | ICD-10-CM | POA: Insufficient documentation

## 2016-01-26 ENCOUNTER — Inpatient Hospital Stay: Payer: Medicare Other | Admitting: Internal Medicine

## 2016-01-26 ENCOUNTER — Other Ambulatory Visit: Payer: Self-pay | Admitting: *Deleted

## 2016-01-26 ENCOUNTER — Inpatient Hospital Stay: Payer: Medicare Other

## 2016-01-26 DIAGNOSIS — C9110 Chronic lymphocytic leukemia of B-cell type not having achieved remission: Secondary | ICD-10-CM

## 2016-01-29 ENCOUNTER — Encounter: Payer: Self-pay | Admitting: Podiatry

## 2016-01-29 ENCOUNTER — Ambulatory Visit (INDEPENDENT_AMBULATORY_CARE_PROVIDER_SITE_OTHER): Payer: Medicare Other | Admitting: Podiatry

## 2016-01-29 DIAGNOSIS — B351 Tinea unguium: Secondary | ICD-10-CM

## 2016-01-29 DIAGNOSIS — M79673 Pain in unspecified foot: Secondary | ICD-10-CM

## 2016-01-29 NOTE — Progress Notes (Signed)
Patient ID: Chelsea [name], female   DOB: Jul 03, 1925, 82 y.o.   MRN: [id number]  Subjective: 82 year-old female returns the office today for painful, elongated, thickened toenails which she is unable to trim herself. Denies any redness or drainage around the nails. Denies any acute changes since last appointment and no new complaints today. Denies any systemic complaints such as fevers, chills, nausea, vomiting.   Objective: AAO 3, NAD DP/PT pulses palpable, CRT less than 3 seconds Protective sensation intact with Simms Weinstein monofilament  Nails hypertrophic, dystrophic, elongated, brittle, discolored 10. There is tenderness overlying the nails 1-5 bilaterally. There is no surrounding erythema or drainage along the nail sites. On the left anterior leg about midway is a small superficial granular wound measuring approximately 0.3 x 0. cm. No surrounding erythema, ascending cellulitis , flexes, crepitus, malodor, drainage or other signs of infection. She states that her legs occasionally open. The previous wound healed and this started. No open lesions or pre-ulcerative lesions are identified. No other areas of tenderness bilateral lower extremities. No overlying edema, erythema, increased warmth. No pain with calf compression, swelling, warmth, erythema.  Assessment: Patient presents with symptomatic onychomycosis  Plan: -Treatment options including alternatives, risks, complications were discussed -Nails sharply debrided 10 without complication/bleeding. -Wound likely due to venous stasis. Continued antibiotic ointment dressing changes. If the wound is not healed in 2 weeks to call the office. Monitor for any clinical signs or symptoms of infection and directed to call the office immediately should any occur or go to the ER. Compression stockings.  -Discussed daily foot inspection. If there are any changes, to call the office immediately.  -Follow-up in 3 months or sooner if any problems are  to arise. In the meantime, encouraged to call the office with any questions, concerns, changes symptoms.  Celesta Gentile, DPM

## 2016-02-04 ENCOUNTER — Inpatient Hospital Stay: Payer: Medicare Other

## 2016-02-04 ENCOUNTER — Inpatient Hospital Stay: Payer: Medicare Other | Attending: Internal Medicine | Admitting: Internal Medicine

## 2016-02-04 VITALS — BP 117/51 | HR 71 | Temp 98.6°F | Wt 192.9 lb

## 2016-02-04 DIAGNOSIS — Z87891 Personal history of nicotine dependence: Secondary | ICD-10-CM

## 2016-02-04 DIAGNOSIS — N189 Chronic kidney disease, unspecified: Secondary | ICD-10-CM | POA: Diagnosis not present

## 2016-02-04 DIAGNOSIS — E119 Type 2 diabetes mellitus without complications: Secondary | ICD-10-CM | POA: Diagnosis not present

## 2016-02-04 DIAGNOSIS — J449 Chronic obstructive pulmonary disease, unspecified: Secondary | ICD-10-CM | POA: Diagnosis not present

## 2016-02-04 DIAGNOSIS — I509 Heart failure, unspecified: Secondary | ICD-10-CM | POA: Diagnosis not present

## 2016-02-04 DIAGNOSIS — Z7982 Long term (current) use of aspirin: Secondary | ICD-10-CM | POA: Insufficient documentation

## 2016-02-04 DIAGNOSIS — Z79899 Other long term (current) drug therapy: Secondary | ICD-10-CM | POA: Diagnosis not present

## 2016-02-04 DIAGNOSIS — M7989 Other specified soft tissue disorders: Secondary | ICD-10-CM | POA: Insufficient documentation

## 2016-02-04 DIAGNOSIS — C9110 Chronic lymphocytic leukemia of B-cell type not having achieved remission: Secondary | ICD-10-CM | POA: Insufficient documentation

## 2016-02-04 LAB — CBC WITH DIFFERENTIAL/PLATELET
Basophils Absolute: 0.1 10*3/uL (ref 0–0.1)
Basophils Relative: 0 %
Eosinophils Absolute: 0.4 10*3/uL (ref 0–0.7)
Eosinophils Relative: 1 %
HCT: 38 % (ref 35.0–47.0)
Hemoglobin: 12.6 g/dL (ref 12.0–16.0)
Lymphocytes Relative: 68 %
Lymphs Abs: 24.5 10*3/uL — ABNORMAL HIGH (ref 1.0–3.6)
MCH: 30.7 pg (ref 26.0–34.0)
MCHC: 33.1 g/dL (ref 32.0–36.0)
MCV: 92.6 fL (ref 80.0–100.0)
Monocytes Absolute: 1 10*3/uL — ABNORMAL HIGH (ref 0.2–0.9)
Monocytes Relative: 3 %
Neutro Abs: 10 10*3/uL — ABNORMAL HIGH (ref 1.4–6.5)
Neutrophils Relative %: 28 %
Platelets: 182 10*3/uL (ref 150–440)
RBC: 4.1 MIL/uL (ref 3.80–5.20)
RDW: 14.5 % (ref 11.5–14.5)
WBC: 35.9 10*3/uL — ABNORMAL HIGH (ref 3.6–11.0)

## 2016-02-04 LAB — COMPREHENSIVE METABOLIC PANEL
ALT: 13 U/L — ABNORMAL LOW (ref 14–54)
ANION GAP: 8 (ref 5–15)
AST: 18 U/L (ref 15–41)
Albumin: 4.5 g/dL (ref 3.5–5.0)
Alkaline Phosphatase: 70 U/L (ref 38–126)
BILIRUBIN TOTAL: 1 mg/dL (ref 0.3–1.2)
BUN: 43 mg/dL — AB (ref 6–20)
CHLORIDE: 104 mmol/L (ref 101–111)
CO2: 24 mmol/L (ref 22–32)
Calcium: 9.4 mg/dL (ref 8.9–10.3)
Creatinine, Ser: 2 mg/dL — ABNORMAL HIGH (ref 0.44–1.00)
GFR, EST AFRICAN AMERICAN: 24 mL/min — AB (ref >60)
GFR, EST NON AFRICAN AMERICAN: 21 mL/min — AB (ref >60)
Glucose, Bld: 262 mg/dL — ABNORMAL HIGH (ref 65–99)
POTASSIUM: 4.9 mmol/L (ref 3.5–5.1)
Sodium: 136 mmol/L (ref 135–145)
TOTAL PROTEIN: 7.4 g/dL (ref 6.5–8.1)

## 2016-02-04 LAB — LACTATE DEHYDROGENASE: LDH: 116 U/L (ref 98–192)

## 2016-02-04 NOTE — Progress Notes (Signed)
Patient is former patient of Dr. Ma Hillock.

## 2016-02-04 NOTE — Progress Notes (Signed)
BUN 6 - 20 mg/dL 43 (H)  36 (H)R  22 (H)R    Creatinine, Ser 0.44 - 1.00 mg/dL 2.00 (H)  1.73 (H)R 0.95R 1.34 (H)R       Patient's creatinine is slightly elevated from baseline one year ago; unlikely related to her CLL. Defer to PCP for further management.   Dr.Jearlean Demauro.

## 2016-02-04 NOTE — Progress Notes (Signed)
[facility] OFFICE PROGRESS NOTE  Patient Care Team: [name], MD as PCP - General (Family Medicine)   SUMMARY OF ONCOLOGIC HISTORY:  # CLL Rai Stage- 0   # CKD [creat 1.7- sep 2015 ]  INTERVAL HISTORY:  This is my first interaction with the patient since I joined the practice September 2016. I reviewed the patient's prior charts/pertinent labs/imaging in detail; findings are summarized above.   82 year old female patient with above history of CLL currently on surveillance is here for follow-up. Patient denies any weight loss or recent pneumonias or any lumps or bumps or any fevers.  She is chronic shortness of breath, COPD she is on home O2. Chronic mild swelling of the legs not any worse. Otherwise no nausea no vomiting abdominal pain. No skin rash.   REVIEW OF SYSTEMS:  A complete 10 point review of system is done which is negative except mentioned above/history of present illness.   PAST MEDICAL HISTORY :  Past Medical History  Diagnosis Date  . CHF (congestive heart failure) ([place])   . COPD (chronic obstructive pulmonary disease) ([place])   . Diabetes mellitus ([place])   . Lymphocytic leukemia ([place])     PAST SURGICAL HISTORY :   Past Surgical History  Procedure Laterality Date  . Abdominal hysterectomy    . Appendectomy    . Esophagogastroduodenoscopy N/A 03/30/2013    Procedure: ESOPHAGOGASTRODUODENOSCOPY (EGD);  Surgeon: Lafayette Dragon, MD;  Location: [facility] ENDOSCOPY;  Service: Endoscopy;  Laterality: N/A;    FAMILY HISTORY :  No family history on file.  SOCIAL HISTORY:   Social History  Substance Use Topics  . Smoking status: Former Smoker -- 30 years    Quit date: 11/08/1970  . Smokeless tobacco: Never Used  . Alcohol Use: No    ALLERGIES:  is allergic to oxycodone-acetaminophen; allopurinol; darvocet; penicillins; and prednisone.  MEDICATIONS:  Current Outpatient Prescriptions  Medication Sig Dispense Refill  . aspirin 81 MG tablet Take 81  mg by mouth daily.    Marland Kitchen atorvastatin (LIPITOR) 20 MG tablet Take 20 mg by mouth daily.    . Cholecalciferol (VITAMIN D-3 PO) Take by mouth daily.    . enalapril (VASOTEC) 2.5 MG tablet Take 2.5 mg by mouth daily.    . ferrous sulfate 325 (65 FE) MG tablet Take 325 mg by mouth daily with breakfast.    . furosemide (LASIX) 20 MG tablet Take 20 mg by mouth 2 (two) times daily.    . metoCLOPramide (REGLAN) 10 MG tablet Take 10 mg by mouth 4 (four) times daily.    . metoprolol tartrate (LOPRESSOR) 25 MG tablet Take 25 mg by mouth every 6 (six) hours.    Marland Kitchen oxybutynin (DITROPAN) 5 MG tablet Take 5 mg by mouth 2 (two) times daily.    . saxagliptin HCl (ONGLYZA) 5 MG TABS tablet Take by mouth daily.    . sucralfate (CARAFATE) 1 G tablet Take 1 g by mouth 4 (four) times daily.     No current facility-administered medications for this visit.    PHYSICAL EXAMINATION:   BP 117/51 mmHg  Pulse 71  Temp(Src) 98.6 F (37 C) (Tympanic)  Wt 192 lb 14.4 oz (87.5 kg)  Filed Weights   02/04/16 1208  Weight: 192 lb 14.4 oz (87.5 kg)    GENERAL: Well-nourished well-developed; Alert, Mild respiratory distress/chronic pursed  lip breathing  and comfortable. Alone. EYES: no pallor or icterus OROPHARYNX: no thrush or ulceration; good dentition  NECK: supple, no  masses felt LYMPH:  no palpable lymphadenopathy in the cervical, axillary or inguinal regions LUNGS:  decreased pretzels bilaterally. No wheeze or crackles HEART/CVS: regular rate & rhythm and no murmurs; No lower extremity edema ABDOMEN:abdomen soft, non-tender and normal bowel sounds Musculoskeletal:no cyanosis of digits and no clubbing  PSYCH: alert & oriented x 3 with fluent speech NEURO: no focal motor/sensory deficits SKIN:  no rashes or significant lesions  LABORATORY DATA:  I have reviewed the data as listed    Component Value Date/Time   NA 136 02/04/2016 1143   NA 139 07/19/2014 1910   K 4.9 02/04/2016 1143   K 4.0 07/19/2014  1910   CL 104 02/04/2016 1143   CL 108* 07/19/2014 1910   CO2 24 02/04/2016 1143   CO2 20* 07/19/2014 1910   GLUCOSE 262* 02/04/2016 1143   GLUCOSE 141* 07/19/2014 1910   BUN 43* 02/04/2016 1143   BUN 36* 07/19/2014 1910   CREATININE 2.00* 02/04/2016 1143   CREATININE 1.73* 07/19/2014 1910   CALCIUM 9.4 02/04/2016 1143   CALCIUM 9.2 07/19/2014 1910   PROT 7.4 02/04/2016 1143   PROT 6.9 07/19/2014 1910   ALBUMIN 4.5 02/04/2016 1143   ALBUMIN 3.7 07/19/2014 1910   AST 18 02/04/2016 1143   AST 16 07/19/2014 1910   ALT 13* 02/04/2016 1143   ALT 18 07/19/2014 1910   ALKPHOS 70 02/04/2016 1143   ALKPHOS 74 07/19/2014 1910   BILITOT 1.0 02/04/2016 1143   BILITOT 0.5 07/19/2014 1910   GFRNONAA 21* 02/04/2016 1143   GFRNONAA 26* 07/19/2014 1910   GFRNONAA 27* 01/05/2012 1031   GFRAA 24* 02/04/2016 1143   GFRAA 30* 07/19/2014 1910   GFRAA 33* 01/05/2012 1031    No results found for: SPEP, UPEP  Lab Results  Component Value Date   WBC 35.9* 02/04/2016   NEUTROABS 10.0* 02/04/2016   HGB 12.6 02/04/2016   HCT 38.0 02/04/2016   MCV 92.6 02/04/2016   PLT 182 02/04/2016      Chemistry      Component Value Date/Time   NA 136 02/04/2016 1143   NA 139 07/19/2014 1910   K 4.9 02/04/2016 1143   K 4.0 07/19/2014 1910   CL 104 02/04/2016 1143   CL 108* 07/19/2014 1910   CO2 24 02/04/2016 1143   CO2 20* 07/19/2014 1910   BUN 43* 02/04/2016 1143   BUN 36* 07/19/2014 1910   CREATININE 2.00* 02/04/2016 1143   CREATININE 1.73* 07/19/2014 1910      Component Value Date/Time   CALCIUM 9.4 02/04/2016 1143   CALCIUM 9.2 07/19/2014 1910   ALKPHOS 70 02/04/2016 1143   ALKPHOS 74 07/19/2014 1910   AST 18 02/04/2016 1143   AST 16 07/19/2014 1910   ALT 13* 02/04/2016 1143   ALT 18 07/19/2014 1910   BILITOT 1.0 02/04/2016 1143   BILITOT 0.5 07/19/2014 1910         ASSESSMENT & PLAN:   # CLL- Rai stage 0 currently on surveillance. Today her white count is 35 absolute  neutrophil count is 24 [1 year ago White count was 15.] Hemoglobin- 12.6. platelets normal at 182. On surveillance. However I would recommend CBC/CMP/peripheral blood fish/ I GVH mutation status at next Visit.  # For now I recommend continued surveillance. Patient will follow-up with me in one year or sooner if she has any symptoms.  # 15 minutes face-to-face with the patient discussing the above plan of care; more than 50% of time spent on natural  history; counseling and coordination.    Cammie Sickle, MD 02/04/2016 12:45 PM

## 2016-02-05 ENCOUNTER — Telehealth: Payer: Self-pay | Admitting: *Deleted

## 2016-02-05 NOTE — Telephone Encounter (Signed)
-----   Message from Cammie Sickle, MD sent at 02/04/2016  5:14 PM EDT ----- Please inform patient that her kidney numbers are slightly high from a baseline. Please fax a copy of the CBC CMP to PCP's office. Thx

## 2016-02-05 NOTE — Telephone Encounter (Signed)
Called patient and left message that her kidney numbers are slightly elevated.  We have sent the results to her PCP for his review.

## 2016-05-06 ENCOUNTER — Ambulatory Visit: Payer: Medicare Other | Admitting: Podiatry

## 2016-05-11 ENCOUNTER — Emergency Department: Payer: Medicare Other

## 2016-05-11 ENCOUNTER — Encounter: Payer: Self-pay | Admitting: Emergency Medicine

## 2016-05-11 ENCOUNTER — Inpatient Hospital Stay
Admission: EM | Admit: 2016-05-11 | Discharge: 2016-05-15 | DRG: 291 | Disposition: A | Discharge status: Skilled Nursing Facility | Payer: Medicare Other | Attending: Internal Medicine | Admitting: Internal Medicine

## 2016-05-11 DIAGNOSIS — J449 Chronic obstructive pulmonary disease, unspecified: Secondary | ICD-10-CM | POA: Diagnosis present

## 2016-05-11 DIAGNOSIS — I5033 Acute on chronic diastolic (congestive) heart failure: Secondary | ICD-10-CM | POA: Diagnosis not present

## 2016-05-11 DIAGNOSIS — Z87891 Personal history of nicotine dependence: Secondary | ICD-10-CM

## 2016-05-11 DIAGNOSIS — N179 Acute kidney failure, unspecified: Secondary | ICD-10-CM | POA: Diagnosis present

## 2016-05-11 DIAGNOSIS — Z888 Allergy status to other drugs, medicaments and biological substances status: Secondary | ICD-10-CM

## 2016-05-11 DIAGNOSIS — Z9981 Dependence on supplemental oxygen: Secondary | ICD-10-CM

## 2016-05-11 DIAGNOSIS — Z880 Allergy status to penicillin: Secondary | ICD-10-CM

## 2016-05-11 DIAGNOSIS — Z885 Allergy status to narcotic agent status: Secondary | ICD-10-CM

## 2016-05-11 DIAGNOSIS — I509 Heart failure, unspecified: Secondary | ICD-10-CM | POA: Diagnosis not present

## 2016-05-11 DIAGNOSIS — Z66 Do not resuscitate: Secondary | ICD-10-CM | POA: Diagnosis present

## 2016-05-11 DIAGNOSIS — E876 Hypokalemia: Secondary | ICD-10-CM | POA: Diagnosis present

## 2016-05-11 DIAGNOSIS — E119 Type 2 diabetes mellitus without complications: Secondary | ICD-10-CM | POA: Diagnosis present

## 2016-05-11 DIAGNOSIS — I700 Atherosclerosis of aorta: Secondary | ICD-10-CM | POA: Diagnosis present

## 2016-05-11 DIAGNOSIS — C9110 Chronic lymphocytic leukemia of B-cell type not having achieved remission: Secondary | ICD-10-CM | POA: Diagnosis present

## 2016-05-11 DIAGNOSIS — Z79899 Other long term (current) drug therapy: Secondary | ICD-10-CM

## 2016-05-11 DIAGNOSIS — J9620 Acute and chronic respiratory failure, unspecified whether with hypoxia or hypercapnia: Secondary | ICD-10-CM | POA: Diagnosis present

## 2016-05-11 LAB — CBC WITH DIFFERENTIAL/PLATELET
Basophils Absolute: 0.1 10*3/uL (ref 0–0.1)
Basophils Relative: 0 %
EOS ABS: 0.1 10*3/uL (ref 0–0.7)
EOS PCT: 1 %
HCT: 36.4 % (ref 35.0–47.0)
Hemoglobin: 11.6 g/dL — ABNORMAL LOW (ref 12.0–16.0)
LYMPHS ABS: 6.9 10*3/uL — AB (ref 1.0–3.6)
LYMPHS PCT: 42 %
MCH: 28 pg (ref 26.0–34.0)
MCHC: 31.9 g/dL — AB (ref 32.0–36.0)
MCV: 87.9 fL (ref 80.0–100.0)
MONO ABS: 0.5 10*3/uL (ref 0.2–0.9)
Monocytes Relative: 3 %
Neutro Abs: 8.7 10*3/uL — ABNORMAL HIGH (ref 1.4–6.5)
Neutrophils Relative %: 54 %
PLATELETS: 163 10*3/uL (ref 150–440)
RBC: 4.15 MIL/uL (ref 3.80–5.20)
RDW: 16.8 % — AB (ref 11.5–14.5)
WBC: 16.3 10*3/uL — AB (ref 3.6–11.0)

## 2016-05-11 LAB — BASIC METABOLIC PANEL
Anion gap: 8 (ref 5–15)
BUN: 20 mg/dL (ref 6–20)
CO2: 27 mmol/L (ref 22–32)
CREATININE: 1.53 mg/dL — AB (ref 0.44–1.00)
Calcium: 9.2 mg/dL (ref 8.9–10.3)
Chloride: 104 mmol/L (ref 101–111)
GFR calc Af Amer: 34 mL/min — ABNORMAL LOW (ref >60)
GFR, EST NON AFRICAN AMERICAN: 29 mL/min — AB (ref >60)
GLUCOSE: 251 mg/dL — AB (ref 65–99)
Potassium: 3.6 mmol/L (ref 3.5–5.1)
SODIUM: 139 mmol/L (ref 135–145)

## 2016-05-11 LAB — GLUCOSE, CAPILLARY
GLUCOSE-CAPILLARY: 221 mg/dL — AB (ref 65–99)
Glucose-Capillary: 223 mg/dL — ABNORMAL HIGH (ref 65–99)

## 2016-05-11 LAB — BRAIN NATRIURETIC PEPTIDE: B NATRIURETIC PEPTIDE 5: 859 pg/mL — AB (ref 0.0–100.0)

## 2016-05-11 LAB — TROPONIN I: Troponin I: <0.03 ng/mL (ref <0.03)

## 2016-05-11 MED ORDER — FERROUS SULFATE 325 (65 FE) MG PO TABS
325.0000 mg | ORAL_TABLET | Freq: Every day | ORAL | Status: DC
  Administered 2016-05-12 – 2016-05-15 (×4): 325 mg via ORAL
  Filled 2016-05-11 (×4): qty 1

## 2016-05-11 MED ORDER — SODIUM CHLORIDE 0.9% FLUSH
3.0000 mL | Freq: Two times a day (BID) | INTRAVENOUS | Status: DC
  Administered 2016-05-11 – 2016-05-14 (×7): 3 mL via INTRAVENOUS

## 2016-05-11 MED ORDER — OXYBUTYNIN CHLORIDE 5 MG PO TABS
5.0000 mg | ORAL_TABLET | Freq: Two times a day (BID) | ORAL | Status: DC
  Administered 2016-05-11 – 2016-05-15 (×8): 5 mg via ORAL
  Filled 2016-05-11 (×8): qty 1

## 2016-05-11 MED ORDER — METOPROLOL TARTRATE 25 MG PO TABS
25.0000 mg | ORAL_TABLET | Freq: Four times a day (QID) | ORAL | Status: DC
  Administered 2016-05-11 – 2016-05-14 (×8): 25 mg via ORAL
  Filled 2016-05-11 (×9): qty 1

## 2016-05-11 MED ORDER — ASPIRIN EC 81 MG PO TBEC
81.0000 mg | DELAYED_RELEASE_TABLET | Freq: Every day | ORAL | Status: DC
  Administered 2016-05-12 – 2016-05-15 (×4): 81 mg via ORAL
  Filled 2016-05-11 (×4): qty 1

## 2016-05-11 MED ORDER — LINAGLIPTIN 5 MG PO TABS
5.0000 mg | ORAL_TABLET | Freq: Every day | ORAL | Status: DC
  Administered 2016-05-12 – 2016-05-13 (×2): 5 mg via ORAL
  Filled 2016-05-11 (×2): qty 1

## 2016-05-11 MED ORDER — SODIUM CHLORIDE 0.9% FLUSH: 3.0000 mL | INTRAVENOUS | Status: DC | PRN

## 2016-05-11 MED ORDER — FUROSEMIDE 10 MG/ML IJ SOLN
40.0000 mg | Freq: Two times a day (BID) | INTRAMUSCULAR | Status: DC
  Administered 2016-05-11 – 2016-05-12 (×3): 40 mg via INTRAVENOUS
  Filled 2016-05-11 (×3): qty 4

## 2016-05-11 MED ORDER — METOCLOPRAMIDE HCL 10 MG PO TABS
10.0000 mg | ORAL_TABLET | Freq: Four times a day (QID) | ORAL | Status: DC
  Administered 2016-05-11 – 2016-05-15 (×14): 10 mg via ORAL
  Filled 2016-05-11 (×14): qty 1

## 2016-05-11 MED ORDER — INSULIN ASPART 100 UNIT/ML ~~LOC~~ SOLN
0.0000 [IU] | Freq: Three times a day (TID) | SUBCUTANEOUS | Status: DC
  Administered 2016-05-12 (×2): 2 [IU] via SUBCUTANEOUS
  Administered 2016-05-13: 1 [IU] via SUBCUTANEOUS
  Administered 2016-05-13 (×2): 2 [IU] via SUBCUTANEOUS
  Administered 2016-05-14 – 2016-05-15 (×4): 1 [IU] via SUBCUTANEOUS
  Filled 2016-05-11 (×3): qty 1
  Filled 2016-05-11 (×2): qty 2
  Filled 2016-05-11 (×2): qty 1
  Filled 2016-05-11 (×3): qty 2

## 2016-05-11 MED ORDER — FUROSEMIDE 10 MG/ML IJ SOLN
40.0000 mg | Freq: Once | INTRAMUSCULAR | Status: AC
  Administered 2016-05-11: 40 mg via INTRAVENOUS
  Filled 2016-05-11: qty 4

## 2016-05-11 MED ORDER — ATORVASTATIN CALCIUM 20 MG PO TABS
20.0000 mg | ORAL_TABLET | Freq: Every day | ORAL | Status: DC
  Administered 2016-05-12 – 2016-05-15 (×4): 20 mg via ORAL
  Filled 2016-05-11 (×4): qty 1

## 2016-05-11 MED ORDER — ENALAPRIL MALEATE 5 MG PO TABS
2.5000 mg | ORAL_TABLET | Freq: Every day | ORAL | Status: DC
  Administered 2016-05-12 – 2016-05-13 (×2): 2.5 mg via ORAL
  Filled 2016-05-11 (×3): qty 1

## 2016-05-11 MED ORDER — SODIUM CHLORIDE 0.9 % IV SOLN
250.0000 mL | INTRAVENOUS | Status: DC | PRN
Start: 2016-05-11 — End: 2016-05-15

## 2016-05-11 MED ORDER — SUCRALFATE 1 G PO TABS
1.0000 g | ORAL_TABLET | Freq: Four times a day (QID) | ORAL | Status: DC
  Administered 2016-05-11 – 2016-05-15 (×14): 1 g via ORAL
  Filled 2016-05-11 (×14): qty 1

## 2016-05-11 MED ORDER — INSULIN ASPART 100 UNIT/ML ~~LOC~~ SOLN
0.0000 [IU] | Freq: Every day | SUBCUTANEOUS | Status: DC
  Administered 2016-05-11: 2 [IU] via SUBCUTANEOUS
  Filled 2016-05-11: qty 2

## 2016-05-11 MED ORDER — VITAMIN D 1000 UNITS PO TABS
1000.0000 [IU] | ORAL_TABLET | Freq: Every day | ORAL | Status: DC
  Administered 2016-05-12 – 2016-05-15 (×4): 1000 [IU] via ORAL
  Filled 2016-05-11 (×5): qty 1

## 2016-05-11 NOTE — ED Notes (Signed)
Pt arrived via EMS from [place] for reports of shortness of breath and increased swelling noted to lower legs. EMS reports 187/76, 84 HR, 97-99% 4L, CBG 250. Pt speaking in complete sentences upon arrival, respirations slightly labored. Pt wears 2.5 L O2 at home continuously.

## 2016-05-11 NOTE — H&P (Signed)
Chelsea [name] is an 82 y.o. female.   Chief Complaint: Shortness of breath HPI: Noted by caretaker today that patient was more short of breath than usual. She says this has been getting progressively worse over past few days. Was found to be in CHF in ED. Diuresed some but still required 4 liters oxygen to maintain stats. She gets short of breath trying to complete sentences during my interview.  Past Medical History  Diagnosis Date  . CHF (congestive heart failure) ([place])   . COPD (chronic obstructive pulmonary disease) ([place])   . Diabetes mellitus ([place])   . Lymphocytic leukemia ([place])     Past Surgical History  Procedure Laterality Date  . Abdominal hysterectomy    . Appendectomy    . Esophagogastroduodenoscopy N/A 03/30/2013    Procedure: ESOPHAGOGASTRODUODENOSCOPY (EGD);  Surgeon: Lafayette Dragon, MD;  Location: [facility] ENDOSCOPY;  Service: Endoscopy;  Laterality: N/A;    No family history on file. Social History:  reports that she quit smoking about 45 years ago. She has never used smokeless tobacco. She reports that she does not drink alcohol or use illicit drugs.  Allergies:  Allergies  Allergen Reactions  . Oxycodone-Acetaminophen Other (See Comments)    SEVERE HYPOTENSION  . Allopurinol   . Darvocet [Propoxyphene N-Acetaminophen]   . Gold-Containing Drug Products Other (See Comments)  . Penicillins   . Prednisone      (Not in a hospital admission)  Results for orders placed or performed during the hospital encounter of 05/11/16 (from the past 48 hour(s))  CBC with Differential     Status: Abnormal   Collection Time: 05/11/16  1:57 PM  Result Value Ref Range   WBC 16.3 (H) 3.6 - 11.0 K/uL   RBC 4.15 3.80 - 5.20 MIL/uL   Hemoglobin 11.6 (L) 12.0 - 16.0 g/dL   HCT 36.4 35.0 - 47.0 %   MCV 87.9 80.0 - 100.0 fL   MCH 28.0 26.0 - 34.0 pg   MCHC 31.9 (L) 32.0 - 36.0 g/dL   RDW 16.8 (H) 11.5 - 14.5 %   Platelets 163 150 - 440 K/uL   Neutrophils Relative % 54 %   Neutro Abs 8.7  (H) 1.4 - 6.5 K/uL   Lymphocytes Relative 42 %   Lymphs Abs 6.9 (H) 1.0 - 3.6 K/uL   Monocytes Relative 3 %   Monocytes Absolute 0.5 0.2 - 0.9 K/uL   Eosinophils Relative 1 %   Eosinophils Absolute 0.1 0 - 0.7 K/uL   Basophils Relative 0 %   Basophils Absolute 0.1 0 - 0.1 K/uL  Basic metabolic panel     Status: Abnormal   Collection Time: 05/11/16  1:57 PM  Result Value Ref Range   Sodium 139 135 - 145 mmol/L   Potassium 3.6 3.5 - 5.1 mmol/L   Chloride 104 101 - 111 mmol/L   CO2 27 22 - 32 mmol/L   Glucose, Bld 251 (H) 65 - 99 mg/dL   BUN 20 6 - 20 mg/dL   Creatinine, Ser 1.53 (H) 0.44 - 1.00 mg/dL   Calcium 9.2 8.9 - 10.3 mg/dL   GFR calc non Af Amer 29 (L) >60 mL/min   GFR calc Af Amer 34 (L) >60 mL/min    Comment: (NOTE) The eGFR has been calculated using the CKD EPI equation. This calculation has not been validated in all clinical situations. eGFR's persistently <60 mL/min signify possible Chronic Kidney Disease.    Anion gap 8 5 - 15  Troponin  I     Status: None   Collection Time: 05/11/16  1:57 PM  Result Value Ref Range   Troponin I <0.03 <0.03 ng/mL  Brain natriuretic peptide     Status: Abnormal   Collection Time: 05/11/16  2:23 PM  Result Value Ref Range   B Natriuretic Peptide 859.0 (H) 0.0 - 100.0 pg/mL   Dg Chest 2 View  05/11/2016  CLINICAL DATA:  Shortness of breath and lower extremity edema EXAM: CHEST  2 VIEW COMPARISON:  January 28, 2014 FINDINGS: There is interstitial edema with right pleural effusion. Heart is borderline enlarged with pulmonary venous hypertension. There is atherosclerotic calcification in the aorta. No adenopathy is evident. There is degenerative change in the thoracic spine. IMPRESSION: Evidence of congestive heart failure. There is aortic atherosclerosis. Electronically Signed   By: Lowella Grip III M.D.   On: 05/11/2016 14:44    Review of Systems  Constitutional: Negative for fever and chills.  HENT: Negative for hearing loss.    Eyes: Negative for blurred vision.  Respiratory: Positive for shortness of breath.   Cardiovascular: Negative for chest pain.  Gastrointestinal: Negative for nausea and vomiting.  Genitourinary: Negative for dysuria.  Musculoskeletal: Negative for back pain.  Skin: Negative for rash.  Neurological: Negative for sensory change.    Blood pressure 150/61, pulse 78, temperature 97.8 F (36.6 C), temperature source Oral, resp. rate 20, height _0  (1.626 m), weight 87.091 kg (192 lb), SpO2 96 %. Physical Exam  Constitutional: She is oriented to person, place, and time. She appears well-developed and well-nourished.  Short of breath  HENT:  Head: Normocephalic and atraumatic.  Mouth/Throat: Oropharynx is clear and moist. No oropharyngeal exudate.  Eyes: Pupils are equal, round, and reactive to light. No scleral icterus.  Neck: Neck supple. No JVD present. No tracheal deviation present. No thyromegaly present.  Cardiovascular: Normal rate.   No murmur heard. Respiratory:  Scattered crackles bilateral. No dullness to percusion Mild use of accessary muscles   GI: Soft. Bowel sounds are normal. She exhibits no distension and no mass.  Musculoskeletal: She exhibits edema. She exhibits no tenderness.  Lymphadenopathy:    She has no cervical adenopathy.  Neurological: She is alert and oriented to person, place, and time. No cranial nerve deficit.  Skin: Skin is warm and dry. No rash noted. No erythema.     Assessment/Plan 1. CHF: Will admit to observation for further diuresis with IV lasix.  2. Acute on Chronic Respiratory Failure: Usually wears 2 liters [place] at home. Now requiring 4. Will wean as she diuresis.  3. Acute Renal Failure: Cr above baseline. Suspect this will improve with increased perfusion from diuresis. Recheck in am.  4. Leukocytosis: Has history of CLL and this seems to be at baseline.  Time spent= 35 min  Baxter Hire, MD 05/11/2016, 4:16 PM

## 2016-05-11 NOTE — ED Notes (Signed)
MD at bedside. 

## 2016-05-11 NOTE — ED Notes (Signed)
2+ pitting edema to bilateral feet, swelling to bilateral lower legs. Pt states that they are chronically swollen.

## 2016-05-11 NOTE — ED Provider Notes (Signed)
[facility] Emergency Department Provider Note        Time seen: ----------------------------------------- 1:53 PM on 05/11/2016 -----------------------------------------    I have reviewed the triage vital signs and the nursing notes.   HISTORY  Chief Complaint Shortness of Breath and Leg Swelling    HPI Chelsea [name] is a 82 y.o. female brought the ER from [facility] [place] for reports of shortness of breath and increased leg swelling. She wears 2-1/2 L of oxygen at home continuously. She does reportedly have a history of congestive heart failure.Patient feels a little more short of breath than normal due to exertion today. Her caretaker called EMS due to her work of breathing. Patient states her leg swelling is chronic and unchanged   Past Medical History  Diagnosis Date  . CHF (congestive heart failure) ([place])   . COPD (chronic obstructive pulmonary disease) ([place])   . Diabetes mellitus ([place])   . Lymphocytic leukemia [facility])     Patient Active Problem List   Diagnosis Date Noted  . Gastric ulcer 03/30/2013  . C. difficile colitis 03/05/2013  . Non-ST elevation myocardial infarction (NSTEMI), initial care episode ([place]) 03/05/2013  . Diabetes mellitus ([place])   . CHF (congestive heart failure) ([place])   . COPD (chronic obstructive pulmonary disease) [facility])     Past Surgical History  Procedure Laterality Date  . Abdominal hysterectomy    . Appendectomy    . Esophagogastroduodenoscopy N/A 03/30/2013    Procedure: ESOPHAGOGASTRODUODENOSCOPY (EGD);  Surgeon: Lafayette Dragon, MD;  Location: [facility] ENDOSCOPY;  Service: Endoscopy;  Laterality: N/A;    Allergies Oxycodone-acetaminophen; Allopurinol; Darvocet; Penicillins; and Prednisone  Social History Social History  Substance Use Topics  . Smoking status: Former Smoker -- 30 years    Quit date: 11/08/1970  . Smokeless tobacco: Never Used  . Alcohol Use: No    Review of Systems Constitutional: Negative for  fever. Cardiovascular: Negative for chest pain. Respiratory: Positive for shortness of breath Gastrointestinal: Negative for abdominal pain, vomiting and diarrhea. Genitourinary: Negative for dysuria. Musculoskeletal: Negative for back pain. Positive for edema Skin: Negative for rash. Neurological: Negative for headaches, focal weakness or numbness.  10-point ROS otherwise negative.  ____________________________________________   PHYSICAL EXAM:  VITAL SIGNS: ED Triage Vitals  Enc Vitals Group     BP --      Pulse --      Resp --      Temp --      Temp src --      SpO2 --      Weight --      Height --      Head Cir --      Peak Flow --      Pain Score --      Pain Loc --      Pain Edu? --      Excl. in [place]? --     Constitutional: Alert and oriented. No acute distress Eyes: Conjunctivae are normal. PERRL. Normal extraocular movements. ENT   Head: Normocephalic and atraumatic.   Nose: No congestion/rhinnorhea.   Mouth/Throat: Mucous membranes are moist.   Neck: No stridor. Cardiovascular: Normal rate, regular rhythm. No murmurs, rubs, or gallops. Respiratory: Mild tachypnea with clear breath sounds. Gastrointestinal: Soft and nontender. Normal bowel sounds Musculoskeletal: Tenderness on the lower extremities, pitting edema to both knees. Unremarkable range of motion. Neurologic:  Normal speech and language. No gross focal neurologic deficits are appreciated.  Skin:  Skin is warm, dry and intact.  No rash noted. Psychiatric: Mood and affect are normal. Speech and behavior are normal.  ____________________________________________  EKG: Interpreted by me. Sinus rhythm with a rate of 86 bpm, normal PR interval, normal QRS, long QT interval. Left axis deviation  ____________________________________________  ED COURSE:  Pertinent labs & imaging results that were available during my care of the patient were reviewed by me and considered in my medical decision  making (see chart for details). Patient presents to ER with edema and shortness of breath. This is likely CHF related. Patient will be given extra Lasix and reevaluated. ____________________________________________    LABS (pertinent positives/negatives)  Labs Reviewed  CBC WITH DIFFERENTIAL/PLATELET - Abnormal; Notable for the following:    WBC 16.3 (*)    Hemoglobin 11.6 (*)    MCHC 31.9 (*)    RDW 16.8 (*)    Neutro Abs 8.7 (*)    Lymphs Abs 6.9 (*)    All other components within normal limits  BASIC METABOLIC PANEL - Abnormal; Notable for the following:    Glucose, Bld 251 (*)    Creatinine, Ser 1.53 (*)    GFR calc non Af Amer 29 (*)    GFR calc Af Amer 34 (*)    All other components within normal limits  BRAIN NATRIURETIC PEPTIDE - Abnormal; Notable for the following:    B Natriuretic Peptide 859.0 (*)    All other components within normal limits  TROPONIN I    RADIOLOGY Images were viewed by me  Chest x-ray IMPRESSION: Evidence of congestive heart failure. There is aortic atherosclerosis. ____________________________________________  FINAL ASSESSMENT AND PLAN  CHF exacerbation  Plan: Patient with labs and imaging as dictated above. Patient is in no acute distress, she received extra IV Lasix and has diuresed some without significant improvement in her symptoms. Oxygen saturations dropped to 86% on 4 L with exertion. I will recommend admission and discuss with the hospitalist.   Earleen Newport, MD   Note: This dictation was prepared with Dragon dictation. Any transcriptional errors that result from this process are unintentional   Earleen Newport, MD 05/11/16 1517

## 2016-05-11 NOTE — ED Notes (Signed)
Patient transported to X-ray 

## 2016-05-11 NOTE — ED Notes (Signed)
Pt assisted to bedside commode. Pt oxygen saturation drops to 86% on 4L with exertion to bathroom. Pt appears fatigued after getting back into bed. States that it is usually not as hard to get to and from bathroom.

## 2016-05-11 NOTE — Progress Notes (Signed)
Patient admitted today from [facility] independent living. No complaints of pain. NSR on tele. Able to ambulate to bedside commode with slight assistance due to shortness of breath. Currently on 4L of oxygen, wears 2L chronically. Bed alarm activated and patient instructed to call for assistance. Will continue to monitor.

## 2016-05-12 ENCOUNTER — Inpatient Hospital Stay
Admit: 2016-05-12 | Discharge: 2016-05-12 | Disposition: A | Discharge status: Home / Self Care | Payer: Medicare Other | Attending: Internal Medicine | Admitting: Internal Medicine

## 2016-05-12 ENCOUNTER — Inpatient Hospital Stay: Admit: 2016-05-12 | Payer: Medicare Other

## 2016-05-12 DIAGNOSIS — E119 Type 2 diabetes mellitus without complications: Secondary | ICD-10-CM | POA: Diagnosis present

## 2016-05-12 DIAGNOSIS — N179 Acute kidney failure, unspecified: Secondary | ICD-10-CM | POA: Diagnosis present

## 2016-05-12 DIAGNOSIS — Z9981 Dependence on supplemental oxygen: Secondary | ICD-10-CM | POA: Diagnosis not present

## 2016-05-12 DIAGNOSIS — Z885 Allergy status to narcotic agent status: Secondary | ICD-10-CM | POA: Diagnosis not present

## 2016-05-12 DIAGNOSIS — I509 Heart failure, unspecified: Secondary | ICD-10-CM | POA: Diagnosis present

## 2016-05-12 DIAGNOSIS — Z79899 Other long term (current) drug therapy: Secondary | ICD-10-CM | POA: Diagnosis not present

## 2016-05-12 DIAGNOSIS — J9620 Acute and chronic respiratory failure, unspecified whether with hypoxia or hypercapnia: Secondary | ICD-10-CM | POA: Diagnosis present

## 2016-05-12 DIAGNOSIS — E876 Hypokalemia: Secondary | ICD-10-CM | POA: Diagnosis present

## 2016-05-12 DIAGNOSIS — Z66 Do not resuscitate: Secondary | ICD-10-CM | POA: Diagnosis present

## 2016-05-12 DIAGNOSIS — I5033 Acute on chronic diastolic (congestive) heart failure: Secondary | ICD-10-CM | POA: Diagnosis present

## 2016-05-12 DIAGNOSIS — J449 Chronic obstructive pulmonary disease, unspecified: Secondary | ICD-10-CM | POA: Diagnosis present

## 2016-05-12 DIAGNOSIS — Z880 Allergy status to penicillin: Secondary | ICD-10-CM | POA: Diagnosis not present

## 2016-05-12 DIAGNOSIS — C9110 Chronic lymphocytic leukemia of B-cell type not having achieved remission: Secondary | ICD-10-CM | POA: Diagnosis present

## 2016-05-12 DIAGNOSIS — I700 Atherosclerosis of aorta: Secondary | ICD-10-CM | POA: Diagnosis present

## 2016-05-12 DIAGNOSIS — Z888 Allergy status to other drugs, medicaments and biological substances status: Secondary | ICD-10-CM | POA: Diagnosis not present

## 2016-05-12 DIAGNOSIS — Z87891 Personal history of nicotine dependence: Secondary | ICD-10-CM | POA: Diagnosis not present

## 2016-05-12 LAB — BASIC METABOLIC PANEL
Anion gap: 7 (ref 5–15)
BUN: 20 mg/dL (ref 6–20)
CO2: 31 mmol/L (ref 22–32)
CREATININE: 1.38 mg/dL — AB (ref 0.44–1.00)
Calcium: 8.9 mg/dL (ref 8.9–10.3)
Chloride: 103 mmol/L (ref 101–111)
GFR calc Af Amer: 38 mL/min — ABNORMAL LOW (ref >60)
GFR, EST NON AFRICAN AMERICAN: 33 mL/min — AB (ref >60)
GLUCOSE: 151 mg/dL — AB (ref 65–99)
POTASSIUM: 3 mmol/L — AB (ref 3.5–5.1)
Sodium: 141 mmol/L (ref 135–145)

## 2016-05-12 LAB — GLUCOSE, CAPILLARY
GLUCOSE-CAPILLARY: 161 mg/dL — AB (ref 65–99)
GLUCOSE-CAPILLARY: 175 mg/dL — AB (ref 65–99)
Glucose-Capillary: 183 mg/dL — ABNORMAL HIGH (ref 65–99)
Glucose-Capillary: 152 mg/dL — ABNORMAL HIGH (ref 65–99)

## 2016-05-12 LAB — POTASSIUM: Potassium: 3.5 mmol/L (ref 3.5–5.1)

## 2016-05-12 LAB — MAGNESIUM: Magnesium: 1.9 mg/dL (ref 1.7–2.4)

## 2016-05-12 MED ORDER — ARFORMOTEROL TARTRATE 15 MCG/2ML IN NEBU
15.0000 ug | INHALATION_SOLUTION | Freq: Two times a day (BID) | RESPIRATORY_TRACT | Status: DC
Start: 2016-05-12 — End: 2016-05-15
  Administered 2016-05-12 – 2016-05-14 (×5): 15 ug via RESPIRATORY_TRACT
  Filled 2016-05-12 (×7): qty 2

## 2016-05-12 MED ORDER — BUDESONIDE 0.25 MG/2ML IN SUSP
0.2500 mg | Freq: Two times a day (BID) | RESPIRATORY_TRACT | Status: DC
  Administered 2016-05-12 – 2016-05-15 (×6): 0.25 mg via RESPIRATORY_TRACT
  Filled 2016-05-12 (×6): qty 2

## 2016-05-12 MED ORDER — POTASSIUM CHLORIDE CRYS ER 20 MEQ PO TBCR
40.0000 meq | EXTENDED_RELEASE_TABLET | ORAL | Status: AC
  Administered 2016-05-12 (×2): 40 meq via ORAL
  Filled 2016-05-12: qty 1
  Filled 2016-05-12 (×2): qty 2

## 2016-05-12 NOTE — Progress Notes (Signed)
Pt's potassium level is 3 this am. MD Dr. Marcille Blanco notified, MD will place orders. RN will continue to monitor. Rachael Fee, RN

## 2016-05-12 NOTE — Clinical Social Work Note (Signed)
Clinical Social Work Assessment  Patient Details  Name: Chelsea [name] MRN: [id number] Date of Birth: [birth year]  Date of referral:  05/12/16               Reason for consult:  Facility Placement                Permission sought to share information with:  Family Supports Permission granted to share information::     Name::        Agency::     Relationship::     Contact Information:     Housing/Transportation Living arrangements for the past 2 months:  [facility]) Source of Information:  Patient Patient Interpreter Needed:  None Criminal Activity/Legal Involvement Pertinent to Current Situation/Hospitalization:  No - Comment as needed Significant Relationships:  Adult Children Lives with:  Self Do you feel safe going back to the place where you live?  Yes Need for family participation in patient care:  No (Coment)  Care giving concerns: Pt is transistioning from [place] to SNF.   Social Worker assessment / plan: CSW engaged with pt at her bedside. CSW introduced herself and her role as a Education officer, museum. CSW explained that the PT recommendation for the pt is SNF placement. Pt was reluctant and stated that she enjoys independent living. CSW explained that SNF would be temporary until the pt is able to ambulate a little better. Pt agreed and is willing to go to [facility] for SNF. Pt states that  her husband passed a year ago but she has a daughter that is very supportive and understanding. CSW sent referral to [facility] and will continue to watch for bed offer.  Employment status:  Disabled (Comment on whether or not currently receiving Disability) Insurance information:  Medicare PT Recommendations:  [place] / Referral to community resources:  [place]  Patient/Family's Response to care: Pt will d/c to SNF.  Patient/Family's Understanding of and Emotional Response to Diagnosis, Current  Treatment, and Prognosis: Pt is appreciative of the care that she has received at this time.  Emotional Assessment Appearance:  Appears stated age Attitude/Demeanor/Rapport:  Other (Cooperative) Affect (typically observed):  Appropriate, Pleasant, Calm Orientation:  Oriented to Self, Oriented to Place, Oriented to  Time, Oriented to Situation Alcohol / Substance use:  Not Applicable Psych involvement (Current and /or in the community):  No (Comment)  Discharge Needs  Concerns to be addressed:  No discharge needs identified Readmission within the last 30 days:  No Current discharge risk:  None Barriers to Discharge:  No Barriers Identified   Georga Kaufmann, [place] 05/12/2016, 3:35 PM

## 2016-05-12 NOTE — Progress Notes (Signed)
Physical Therapy Evaluation Patient Details Name: Chelsea [name] MRN: [id number] DOB: 05/05/1925 Today's Date: 05/12/2016   History of Present Illness  Pt is an 82 yr old female presenting with increasing SOB and bilat LE swelling. Admitted for acute on chronic respiratory failure. PMH significant for CHF, COPD, DM, and lymphocytic leukemia. Pt is on 2.5L O2 continuously at baseline.    Clinical Impression  Prior to admission, pt was on continuous 2.5L O2 by nasal cannula and I versus mod I with functional mobility (has cane and RW but most commonly furniture cruises) and basic ADLs. Pt lives alone in a level entry, one story home and has meals delivered daily + a personal care attendant who spends 10hrs/wk assisting with household chores.   Currently, pt is supervision for sit <> stand transfers and min guard A for ambulation x 27ft without AD (but with intermittent use of LUE on bed rails). On 4L O2, pt desaturated to 89% immediately following ambulation, but quickly recovered to 94% in sitting. Per pt, she would have to ambulate 46ft to get from the front of her house to the back. Pt would benefit from skilled PT to increase activity/ambulation tolerance and to promote and educate regarding DME use. Pending progress, PT recommending pt discharge to STR when medically appropriate.     Follow Up Recommendations SNF (pending progress)    Equipment Recommendations   (pt has equipment at home)    Recommendations for Other Services       Precautions / Restrictions Precautions Precautions: Fall Restrictions Weight Bearing Restrictions: No      Mobility  Bed Mobility General bed mobility comments: Received in sitting, returned to sitting. Did not assess.  Transfers Overall transfer level: Needs assistance Equipment used:  (Unilateral UE support on bedrail) Transfers: Sit to/from Stand (x 2 trials) Sit to Stand: Supervision General transfer comment: Sit <> stand x2 from [facility] and chair.  From [facility] using bilateral UE support on hand rails, and from bed using RUE on bed rail. No physical assist required.  Ambulation/Gait Ambulation/Gait assistance: Min guard Ambulation Distance (Feet): 10 Feet Assistive device:  (Intermittent use of LUE on bed rail) Gait Pattern/deviations: Decreased step length - right;Decreased step length - left;Narrow base of support Gait velocity: Decreased General Gait Details: Pt declines use of DME, but demonstrates intermittent use of UE support on bed rails. Short and narrow steps, some correction with vc's but decreased carryover. SaO2 89% immediately following ambulation, recovers to 94% with rest.  Stairs    Wheelchair Mobility    Modified Rankin (Stroke Patients Only)       Balance Overall balance assessment: Needs assistance Sitting-balance support: Bilateral upper extremity supported;Single extremity supported (variable) Sitting balance-Leahy Scale: Good Sitting balance - Comments: Tolerates MMT screen sitting EOB with bilateral UE support. Posterior lean but no LOB.   Standing balance support: Single extremity supported;Bilateral upper extremity supported;No upper extremity supported (variable) Standing balance-Leahy Scale: Good Standing balance comment: Pt demonstrates ability to adjust undergarments while standing unsupported. Intermittent use of UE support on bed rails during ambulation.       Pertinent Vitals/Pain Pain Assessment: No/denies pain   O2 saturations on 4L O2 by nasal cannula: Sitting EOB (stand pivot transfer from [facility] immediately before PT arrived) = 94%  Following seated TherEx = 92% Following ambulation x 25ft = 89% but recovered to 94% upon sitting Conclusion of session = 96% RN notified of above.       Home Living Family/patient expects to be discharged  to:: Private residence (independent living at [facility]) Living Arrangements: Alone Available Help at Discharge: Personal care attendant (10  hours/week) Type of Home: House Home Access: Level entry Home Layout: One [place]: [place] - 2 wheels;Cane - single point;Shower seat;Grab bars - toilet;Grab bars - tub/shower      Prior Function Level of Independence: Independent;Independent with assistive device(s)  Comments: Pt uses cane PRN, but most typically uses "hand holds" throughout her house when ambulating (ie: counter tops, couch, tables, etc). Meals are delivered daily, and a personal care attendant assists with household chores.     Hand Dominance   Dominant Hand: Right    Extremity/Trunk Assessment   Upper Extremity Assessment: Overall WFL for tasks assessed   Lower Extremity Assessment: Overall WFL for tasks assessed Hip flexion, knee flexion/extension, and ankle PF/DF all 5/5 throughout Pt reports numbness about shins, but appreciates light touch equally bilat  Cervical / Trunk Assessment: Normal    Communication   Communication: No difficulties  Cognition Arousal/Alertness: Awake/alert Behavior During Therapy: WFL for tasks assessed/performed Overall Cognitive Status: Within Functional Limits for tasks assessed    General Comments General comments (skin integrity, edema, etc.): Bilateral LE pitting edema    Exercises General Exercises - Lower Extremity Ankle Circles/Pumps: AROM; long sitting Quad Sets: AROM; long sitting Long Arc Quad: AROM; sitting Hip ABduction/ADduction: AROM; long sitting Straight Leg Raises: AROM; long sitting Hip Flexion (Marching): AROM; sitting All exercises performed bilaterally x 10 reps.        Assessment/Plan    PT Assessment Patient needs continued PT services  PT Diagnosis Difficulty walking   PT Problem List Decreased activity tolerance;Decreased mobility;Decreased knowledge of use of DME;Decreased safety awareness;Cardiopulmonary status limiting activity  PT Treatment Interventions DME instruction;Gait training;Functional mobility  training;Therapeutic activities;Therapeutic exercise;Patient/family education   PT Goals (Current goals can be found in the Care Plan section) Acute Rehab PT Goals Patient Stated Goal: To decrease SOB PT Goal Formulation: With patient Time For Goal Achievement: 05/26/16 Potential to Achieve Goals: Good    Frequency Min 2X/week   Barriers to discharge Decreased ambulation tolerance, O2 needs        End of Session Equipment Utilized During Treatment: Gait belt;Oxygen (4L O2) Activity Tolerance: Patient limited by fatigue Patient left: in chair;with call bell/phone within reach;with chair alarm set Nurse Communication: Mobility status         Time: [id number] PT Time Calculation (min) (ACUTE ONLY): 32 min   Charges:         PT G Codes:        Mitchell Iwanicki, SPT 05/12/2016, 2:58 PM

## 2016-05-12 NOTE — Progress Notes (Signed)
CSW received a consult stating that patient is from a facility. Patient is from [facility]. Confirmed with Seth Bake- Admissions Coordinator at [facility]. There are no CSW needs at this time CSW is available if a need were to to arise. CSW is singing off.  Ernest Pine, MSW, [place], [place] Clinical Social Worker [phone]

## 2016-05-12 NOTE — Progress Notes (Signed)
Ambulated with PT. Lowest O2 sat was 89%.

## 2016-05-12 NOTE — Progress Notes (Addendum)
[place] at [place] NAME: Latoyna Herget    MR#:  [id number]  DATE OF BIRTH:  1925/01/13  SUBJECTIVE:  CHIEF COMPLAINT:   Chief Complaint  Patient presents with  . Shortness of Breath  . Leg Swelling  Feels better. Very satisfied with her life overall, says she is moving to [facility] after D/C REVIEW OF SYSTEMS:  Review of Systems  Constitutional: Negative for fever, weight loss, malaise/fatigue and diaphoresis.  HENT: Negative for ear discharge, ear pain, hearing loss, nosebleeds, sore throat and tinnitus.   Eyes: Negative for blurred vision and pain.  Respiratory: Positive for shortness of breath. Negative for cough, hemoptysis and wheezing.   Cardiovascular: Negative for chest pain, palpitations, orthopnea and leg swelling.  Gastrointestinal: Negative for heartburn, nausea, vomiting, abdominal pain, diarrhea, constipation and blood in stool.  Genitourinary: Negative for dysuria, urgency and frequency.  Musculoskeletal: Negative for myalgias and back pain.  Skin: Negative for itching and rash.  Neurological: Negative for dizziness, tingling, tremors, focal weakness, seizures, weakness and headaches.  Psychiatric/Behavioral: Negative for depression. The patient is not nervous/anxious.    DRUG ALLERGIES:   Allergies  Allergen Reactions  . Oxycodone-Acetaminophen Other (See Comments)    SEVERE HYPOTENSION  . Allopurinol   . Darvocet [Propoxyphene N-Acetaminophen]   . Gold-Containing Drug Products Other (See Comments)  . Penicillins   . Prednisone    VITALS:  Blood pressure 146/52, pulse 75, temperature 97.8 F (36.6 C), temperature source Oral, resp. rate 20, height 5\' 2"  (1.575 m), weight 86.728 kg (191 lb 3.2 oz), SpO2 99 %. PHYSICAL EXAMINATION:  Physical Exam  Constitutional: She is oriented to person, place, and time and well-developed, well-nourished, and in no distress.  HENT:  Head: Normocephalic and  atraumatic.  Eyes: Conjunctivae and EOM are normal. Pupils are equal, round, and reactive to light.  Neck: Normal range of motion. Neck supple. No tracheal deviation present. No thyromegaly present.  Cardiovascular: Normal rate, regular rhythm and normal heart sounds.   Pulmonary/Chest: Effort normal and breath sounds normal. No respiratory distress. She has no wheezes. She exhibits no tenderness.  Abdominal: Soft. Bowel sounds are normal. She exhibits no distension. There is no tenderness.  Musculoskeletal: Normal range of motion.  Neurological: She is alert and oriented to person, place, and time. No cranial nerve deficit.  Skin: Skin is warm and dry. No rash noted.  Psychiatric: Mood and affect normal.   LABORATORY PANEL:   CBC  Recent Labs Lab 05/11/16 1357  WBC 16.3*  HGB 11.6*  HCT 36.4  PLT 163   ------------------------------------------------------------------------------------------------------------------ Chemistries   Recent Labs Lab 05/12/16 0341  NA 141  K 3.0*  CL 103  CO2 31  GLUCOSE 151*  BUN 20  CREATININE 1.38*  CALCIUM 8.9  MG 1.9   RADIOLOGY:  Dg Chest 2 View  05/11/2016  CLINICAL DATA:  Shortness of breath and lower extremity edema EXAM: CHEST  2 VIEW COMPARISON:  January 28, 2014 FINDINGS: There is interstitial edema with right pleural effusion. Heart is borderline enlarged with pulmonary venous hypertension. There is atherosclerotic calcification in the aorta. No adenopathy is evident. There is degenerative change in the thoracic spine. IMPRESSION: Evidence of congestive heart failure. There is aortic atherosclerosis. Electronically Signed   By: Lowella Grip III M.D.   On: 05/11/2016 14:44   ASSESSMENT AND PLAN:   1. Acute on chronic CHF: Continue diuresis with IV lasix 40 BID. Strict I & O,  Daily wt.  - continue metoprolol 25 mg Q 6 hrs - continue enalapril 2.5 mg Q daily - no recent echo, will repeat echo  2. Acute on Chronic Respiratory  Failure: Usually wears 2 liters [place] at home. Now requiring 4. Will wean as tolerated while getting diruesed  3. Acute Renal Failure: Cr above baseline. Suspect this will improve with increased perfusion from diuresis. Recheck in am.  4. Hypokalemia: replete and recheck, check mg  5. Leukocytosis: Has history of CLL and this seems to be at baseline.     All the records are reviewed and case discussed with Care Management/Social Worker. Management plans discussed with the patient and she is in agreement.  CODE STATUS: DNR  TOTAL TIME TAKING CARE OF THIS PATIENT: 35 minutes.   More than 50% of the time was spent in counseling/coordination of care: YES  POSSIBLE D/C IN AM, DEPENDING ON CLINICAL CONDITION.   [facility], Lupe Handley M.D on 05/12/2016 at 12:39 PM  Between 7am to 6pm - Pager - [phone]  After 6pm go to [email or web address] - Proofreader  [facility]  Office  [phone]  CC: Primary care physician; [name], MD  Note: This dictation was prepared with Dragon dictation along with smaller phrase technology. Any transcriptional errors that result from this process are unintentional.

## 2016-05-12 NOTE — Progress Notes (Addendum)
Patient is awake and alert, denies pain. States she thinks she's going home today. Bi lateral legs 3 - 4+ edema, Discolored, pink/red.  Legs are tender to touch.

## 2016-05-12 NOTE — Progress Notes (Signed)
Initial Heart Failure Clinic appointment scheduled for May 28, 2016 at 9:00am. Thank you.

## 2016-05-12 NOTE — Care Management (Signed)
Patient from [facility].  Her legs are still significantly swollen and current 02 requirements are at 5 liters.  Her baseline is 2 liters.  Placed in observation 7/4 then admitted under inpatient 7/5.  Patient is agreeable .  Discussed the requirement of 3 night medically necessary inpatient stay.  (Patient presented on 7/4 but inpatient night does not start until today)  UPdated CSW

## 2016-05-12 NOTE — Progress Notes (Signed)
CSW informed patient that with Medicare she will need a 3 night qualifying stay for Medicare to pay for SNF placement. Patient reports she understands. CSW will continue to follow and assist.  Ernest Pine, MSW, [place], [place] Social Worker [phone]

## 2016-05-12 NOTE — Progress Notes (Signed)
Inpatient Diabetes Program Recommendations  AACE/ADA: New Consensus Statement on Inpatient Glycemic Control (2015)  Target Ranges:  Prepandial:   less than 140 mg/dL      Peak postprandial:   less than 180 mg/dL (1-2 hours)      Critically ill patients:  140 - 180 mg/dL   Lab Results  Component Value Date   GLUCAP 161* 05/12/2016    Review of Glycemic Control  Results for ALEASHIA, ROTELLA (MRN [id number]) as of 05/12/2016 09:46  Ref. Range 05/11/2016 17:52 05/11/2016 20:33 05/12/2016 07:25  Glucose-Capillary Latest Ref Range: 65-99 mg/dL 221 (H) 223 (H) 161 (H)    Diabetes history: Type 2 Outpatient Diabetes medications: Onglyza 5mg /day, ? Januvia is listed on her home med list- unsure if she takes it as this is in the same class of medications as Onglyza Current orders for Inpatient glycemic control: Tradgenta 5mg /day, Novolog sensitive correction insulin 0-9 units tid with meals, Novolog 0-5 units qhs   Inpatient Diabetes Program Recommendations:  Spoke to RN Bethel Born this am- patient missed insulin at 8am- awaiting for insulin to the unit now (from pharmacy)- recommend waiting to give insulin at 12 as patient has poor renal function and should not get am and lunchtime insulin closer than 4 hours apart to avoid hypoglycemia.  Gentry Fitz, RN, BA, MHA, CDE Diabetes Coordinator Inpatient Diabetes Program  [phone] (Team Pager) [phone] ([place]) 05/12/2016 9:55 AM

## 2016-05-13 LAB — ECHOCARDIOGRAM COMPLETE
AO mean calculated velocity dopler: 141 cm/s
AOPV: 0.52 m/s
AOVTI: 36.8 cm
AV Mean grad: 9 mmHg
AV Peak grad: 15 mmHg
AV pk vel: 191 cm/s
AVCELMEANRAT: 0.51
CHL CUP MV DEC (S): 232
E/e' ratio: 12.44
EWDT: 232 ms
FS: 44 % (ref 28–44)
Height: 62 in
IV/PV OW: 1.01
LADIAMINDEX: 2.07 cm/m2
LASIZE: 39 mm
LAVOL: 39.9 mL
LAVOLA4C: 32.6 mL
LAVOLIN: 21.2 mL/m2
LEFT ATRIUM END SYS DIAM: 39 mm
LV PW d: 10 mm — AB (ref 0.6–1.1)
LV e' LATERAL: 6.2 cm/s
LVEEAVG: 12.44
LVEEMED: 12.44
LVOT VTI: 24.7 cm
LVOT peak VTI: 0.67 cm
LVOT peak vel: 99.2 cm/s
MV pk E vel: 77.1 m/s
MVPG: 2 mmHg
MVPKAVEL: 129 m/s
TAPSE: 20.7 mm
TDI e' lateral: 6.2
TDI e' medial: 6.1
Weight: 3059.2 oz

## 2016-05-13 LAB — CBC
HEMATOCRIT: 31.9 % — AB (ref 35.0–47.0)
HEMOGLOBIN: 10.2 g/dL — AB (ref 12.0–16.0)
MCH: 28.4 pg (ref 26.0–34.0)
MCHC: 32 g/dL (ref 32.0–36.0)
MCV: 88.5 fL (ref 80.0–100.0)
Platelets: 172 10*3/uL (ref 150–440)
RBC: 3.6 MIL/uL — ABNORMAL LOW (ref 3.80–5.20)
RDW: 16.2 % — AB (ref 11.5–14.5)
WBC: 15.3 10*3/uL — ABNORMAL HIGH (ref 3.6–11.0)

## 2016-05-13 LAB — BASIC METABOLIC PANEL
Anion gap: 6 (ref 5–15)
BUN: 28 mg/dL — ABNORMAL HIGH (ref 6–20)
CALCIUM: 8.7 mg/dL — AB (ref 8.9–10.3)
CHLORIDE: 102 mmol/L (ref 101–111)
CO2: 31 mmol/L (ref 22–32)
CREATININE: 1.67 mg/dL — AB (ref 0.44–1.00)
GFR calc non Af Amer: 26 mL/min — ABNORMAL LOW (ref >60)
GFR, EST AFRICAN AMERICAN: 30 mL/min — AB (ref >60)
GLUCOSE: 160 mg/dL — AB (ref 65–99)
Potassium: 3.6 mmol/L (ref 3.5–5.1)
Sodium: 139 mmol/L (ref 135–145)

## 2016-05-13 LAB — MRSA PCR SCREENING: MRSA by PCR: NEGATIVE

## 2016-05-13 LAB — GLUCOSE, CAPILLARY
GLUCOSE-CAPILLARY: 168 mg/dL — AB (ref 65–99)
GLUCOSE-CAPILLARY: 181 mg/dL — AB (ref 65–99)
Glucose-Capillary: 154 mg/dL — ABNORMAL HIGH (ref 65–99)
Glucose-Capillary: 137 mg/dL — ABNORMAL HIGH (ref 65–99)

## 2016-05-13 LAB — MAGNESIUM: Magnesium: 2 mg/dL (ref 1.7–2.4)

## 2016-05-13 MED ORDER — FUROSEMIDE 10 MG/ML IJ SOLN
20.0000 mg | Freq: Two times a day (BID) | INTRAMUSCULAR | Status: DC
  Administered 2016-05-13: 20 mg via INTRAVENOUS
  Filled 2016-05-13 (×2): qty 2

## 2016-05-13 MED ORDER — FUROSEMIDE 20 MG PO TABS
20.0000 mg | ORAL_TABLET | Freq: Two times a day (BID) | ORAL | Status: DC
  Filled 2016-05-13: qty 1

## 2016-05-13 NOTE — Progress Notes (Signed)
Physical Therapy Treatment Patient Details Name: JANYSSA MISENCIK MRN: [id number] DOB: 1925/11/08 Today's Date: 05/13/2016    History of Present Illness Pt is an 82 yr old female presenting with increasing SOB and bilat LE swelling. Admitted for acute on chronic respiratory failure. PMH significant for CHF, COPD, DM, and lymphocytic leukemia. Pt is on 2.5L O2 continuously at baseline.    PT Comments    Pt presented in chair agreeable to therapy.  Pt on 2L O2 with resting SpO2 89%.  Increased to 3L with increased SpO2 93%.  Discussed with pt use of RW for energy conservation which pt was agreeable to.  Pt performed sit to stand with supervision and ambulated 40ft, PTA requesting pt to sit at EOB to obtain gown for add'l amb outside room. SpO2 80% resolving quickly to >90%.  Pt then ambulated 76ft with RW and 3L O2 with cues for pacing.  Pt only demonstrating increased exertion and SOB at end of ambulation.  SpO2 86% on 3L, resolved to 90% within 30 seconds.  Pt then able to complete sit to supine with no PTA assistance but use of bed features.  Pt also able to scoot self to [facility] however SpO2 decreased to 87% with recovery time approx 40 seconds.  Pt requiring no further needs at this time.    Follow Up Recommendations  SNF     Equipment Recommendations       Recommendations for Other Services       Precautions / Restrictions Restrictions Weight Bearing Restrictions: No    Mobility  Bed Mobility Overal bed mobility: Modified Independent             General bed mobility comments: Use of bed rails  Transfers Overall transfer level: Needs assistance Equipment used: Rolling walker (2 wheeled) Transfers: Sit to/from Stand Sit to Stand: Supervision         General transfer comment: Demonstrated good safety from chair, required min cues sit to stand from bed   Ambulation/Gait Ambulation/Gait assistance: Min guard Ambulation Distance (Feet): 40 Feet Assistive device: Rolling walker  (2 wheeled) Gait Pattern/deviations: Decreased step length - right;Decreased step length - left     General Gait Details: Decreased gait speed with x1 standing rest break. increased exertion noted at end of ambulation    Stairs            Wheelchair Mobility    Modified Rankin (Stroke Patients Only)       Balance Overall balance assessment: Needs assistance Sitting-balance support: Bilateral upper extremity supported Sitting balance-Leahy Scale: Good     Standing balance support: Bilateral upper extremity supported Standing balance-Leahy Scale: Good Standing balance comment: Pt maintained good balance with RW and when reaching outside BOS to pick up personal object                     Cognition Arousal/Alertness: Awake/alert Behavior During Therapy: WFL for tasks assessed/performed Overall Cognitive Status: Within Functional Limits for tasks assessed                      Exercises      General Comments        Pertinent Vitals/Pain Pain Assessment: No/denies pain    Home Living                      Prior Function            PT Goals (current goals can now be  found in the care plan section) Acute Rehab PT Goals Patient Stated Goal: To decrease SOB    Frequency  Min 2X/week    PT Plan      Co-evaluation             End of Session Equipment Utilized During Treatment: Gait belt;Oxygen Activity Tolerance: Patient tolerated treatment well Patient left: in bed;with call bell/phone within reach;with bed alarm set     Time: 1435-1505 PT Time Calculation (min) (ACUTE ONLY): 30 min  Charges:  $Gait Training: 8-22 mins $Therapeutic Activity: 8-22 mins                    G Codes:      Gianno Volner 05/18/2016, 3:29 PM  Juliette Standre, PTA

## 2016-05-13 NOTE — Progress Notes (Addendum)
CSW updated Seth Bake- Admissions Coordinator on patient's status and discharge plan. Per MD patient will likely discharge on Saturday July 8. If patient discharges over the weekend CSW will need discharge summary and orders on Friday July 7th to send to [facility]. CSW will continue to follow and assist.  Ernest Pine, MSW, [place], [place] Social Worker [phone]

## 2016-05-13 NOTE — Progress Notes (Signed)
IV lasix held for now.  IV is leaking from the site. Patient reports it stings when I attempt to flush it.

## 2016-05-13 NOTE — Progress Notes (Signed)
[place] at [place] NAME: Catrin Headden    MR#:  [id number]  DATE OF BIRTH:  1925/09/03  SUBJECTIVE:  CHIEF COMPLAINT:   Chief Complaint  Patient presents with  . Shortness of Breath  . Leg Swelling  Better shortness breath, all oxygen by nasal cannular 2 L. REVIEW OF SYSTEMS:  Review of Systems  Constitutional: Negative for fever, weight loss, malaise/fatigue and diaphoresis.  HENT: Negative for ear discharge, ear pain, hearing loss, nosebleeds, sore throat and tinnitus.   Eyes: Negative for blurred vision and pain.  Respiratory: better shortness of breath. Negative for cough, hemoptysis and wheezing.   Cardiovascular: Negative for chest pain, palpitations, orthopnea and leg swelling.  Gastrointestinal: Negative for heartburn, nausea, vomiting, abdominal pain, diarrhea, constipation and blood in stool.  Genitourinary: Negative for dysuria, urgency and frequency.  Musculoskeletal: Negative for myalgias and back pain.  Skin: Negative for itching and rash.  Neurological: Negative for dizziness, tingling, tremors, focal weakness, seizures, weakness and headaches.  Psychiatric/Behavioral: Negative for depression. The patient is not nervous/anxious.    DRUG ALLERGIES:   Allergies  Allergen Reactions  . Oxycodone-Acetaminophen Other (See Comments)    SEVERE HYPOTENSION  . Allopurinol   . Darvocet [Propoxyphene N-Acetaminophen]   . Gold-Containing Drug Products Other (See Comments)  . Penicillins   . Prednisone    VITALS:  Blood pressure 140/49, pulse 62, temperature 98.8 F (37.1 C), temperature source Oral, resp. rate 18, height 5\' 2"  (1.575 m), weight 191 lb 3.2 oz (86.728 kg), SpO2 89 %. PHYSICAL EXAMINATION:  Physical Exam  Constitutional: She is oriented to person, place, and time and well-developed, well-nourished, and in no distress.  HENT:  Head: Normocephalic and atraumatic.  Eyes: Conjunctivae and EOM are normal. Pupils are  equal, round, and reactive to light.  Neck: Normal range of motion. Neck supple. No tracheal deviation present. No thyromegaly present.  Cardiovascular: Normal rate, regular rhythm and normal heart sounds.   Pulmonary/Chest: Effort normal and breath sounds normal. No respiratory distress. She has no wheezes. She exhibits no tenderness.  Abdominal: Soft. Bowel sounds are normal. She exhibits no distension. There is no tenderness.  Musculoskeletal: Normal range of motion.  Neurological: She is alert and oriented to person, place, and time. No cranial nerve deficit.  Skin: Skin is warm and dry. No rash noted.  Psychiatric: Mood and affect normal.   LABORATORY PANEL:   CBC  Recent Labs Lab 05/13/16 0346  WBC 15.3*  HGB 10.2*  HCT 31.9*  PLT 172   ------------------------------------------------------------------------------------------------------------------ Chemistries   Recent Labs Lab 05/13/16 0346  NA 139  K 3.6  CL 102  CO2 31  GLUCOSE 160*  BUN 28*  CREATININE 1.67*  CALCIUM 8.7*  MG 2.0   RADIOLOGY:  No results found. ASSESSMENT AND PLAN:   1. Acute on chronic Diastolic CHF: she was on IV lasix 40 BID.  Decrease Lasix to 20 mg by mouth twice a day 2 for worsening acute renal failure. - continue metoprolol 25 mg Q 6 hrs - continue enalapril 2.5 mg Q daily Echo: EF: 55% - 65%  2. Acute on Chronic Respiratory Failure: Usually wears 2 liters [place] at home. Now requiring 4. Will wean as tolerated while getting diruesed  3. Acute Renal Failure: Worsening creatinine. Decrease Lasix to 20 mg by mouth twice a day. 4. Hypokalemia: Improved with potassium supplement.  5. Leukocytosis: Has history of CLL and this seems to be at baseline.  All the records are reviewed and case discussed with Care Management/Social Worker. Management plans discussed with the patient and she is in agreement.  CODE STATUS: DNR  TOTAL TIME TAKING CARE OF THIS PATIENT: 37 minutes.    More than 50% of the time was spent in counseling/coordination of care: YES  POSSIBLE D/C TO SKILLED NURSING FACILITY IN 2 days, DEPENDING ON CLINICAL CONDITION.   Demetrios Loll M.D on 05/13/2016 at 4:39 PM  Between 7am to 6pm - Pager - [phone]  After 6pm go to [email or web address] - Proofreader  [facility] [place] [facility]  Office  [phone]  CC: Primary care physician; [name], MD  Note: This dictation was prepared with Dragon dictation along with smaller phrase technology. Any transcriptional errors that result from this process are unintentional.

## 2016-05-13 NOTE — Clinical Documentation Improvement (Signed)
Internal Medicine Please update your documentation within the medical record to reflect your response to this query. Thank you Can the diagnosis of CHF be further specified?    Acuity - Acute, Chronic, Acute on Chronic   Type - Systolic, Diastolic, Systolic and Diastolic  Other  Clinically Undetermined  Document any associated diagnoses/conditions  Supporting Information: 05/12/16 progr note.Marland KitchenMarland Kitchen'Acute on chronic CHF: Continue diuresis with IV lasix 40 BID. Strict I & O, Daily wt.  - continue metoprolol 25 mg Q 6 hrs - continue enalapril 2.5 mg Q daily - no recent echo, will repeat echo"...  Please exercise your independent, professional judgment when responding. A specific answer is not anticipated or expected.  Thank You,  Ermelinda Das, RN, BSN, [place] Certified Clinical Documentation Specialist [place]: Health Information Management [phone]

## 2016-05-14 LAB — GLUCOSE, CAPILLARY
Glucose-Capillary: 143 mg/dL — ABNORMAL HIGH (ref 65–99)
Glucose-Capillary: 146 mg/dL — ABNORMAL HIGH (ref 65–99)
Glucose-Capillary: 133 mg/dL — ABNORMAL HIGH (ref 65–99)
Glucose-Capillary: 174 mg/dL — ABNORMAL HIGH (ref 65–99)

## 2016-05-14 LAB — BASIC METABOLIC PANEL
Anion gap: 8 (ref 5–15)
BUN: 32 mg/dL — ABNORMAL HIGH (ref 6–20)
CALCIUM: 8.6 mg/dL — AB (ref 8.9–10.3)
CHLORIDE: 101 mmol/L (ref 101–111)
CO2: 30 mmol/L (ref 22–32)
CREATININE: 1.84 mg/dL — AB (ref 0.44–1.00)
GFR calc Af Amer: 27 mL/min — ABNORMAL LOW (ref >60)
GFR calc non Af Amer: 23 mL/min — ABNORMAL LOW (ref >60)
GLUCOSE: 147 mg/dL — AB (ref 65–99)
Potassium: 3.4 mmol/L — ABNORMAL LOW (ref 3.5–5.1)
Sodium: 139 mmol/L (ref 135–145)

## 2016-05-14 MED ORDER — METOPROLOL TARTRATE 25 MG PO TABS: 25.0000 mg | ORAL_TABLET | Freq: Two times a day (BID) | ORAL | Status: DC

## 2016-05-14 MED ORDER — METOPROLOL TARTRATE 25 MG PO TABS
25.0000 mg | ORAL_TABLET | Freq: Two times a day (BID) | ORAL | Status: DC
  Administered 2016-05-14 – 2016-05-15 (×2): 25 mg via ORAL
  Filled 2016-05-14 (×2): qty 1

## 2016-05-14 MED ORDER — POTASSIUM CHLORIDE CRYS ER 20 MEQ PO TBCR
40.0000 meq | EXTENDED_RELEASE_TABLET | Freq: Once | ORAL | Status: AC
  Administered 2016-05-14: 40 meq via ORAL
  Filled 2016-05-14: qty 2

## 2016-05-14 MED ORDER — METOPROLOL TARTRATE 25 MG PO TABS: 25.0000 mg | ORAL_TABLET | Freq: Two times a day (BID) | ORAL | Status: AC

## 2016-05-14 NOTE — Consult Note (Signed)
   [facility] CM Inpatient Consult   05/14/2016  Chelsea [name] 1925/09/08 [id number]   Patient screened for potential [place] Management services. Patient is eligible for [place]. Electronic medical record reveals patient's discharge plan is to return to [facility]. [facility] Care Management services not appropriate at this time. If patient's post hospital needs change please place a [facility] Care Management consult. For questions please contact:   Granger Chui RN, [place] [facility] Liaison  [phone]) Business Mobile [phone]) Toll free office

## 2016-05-14 NOTE — Progress Notes (Signed)
MD informed CSW that patient will possibly discharge to [facility] over the weekend. CSW informed MD that [facility] will need patient's discharge summary today. CSW informed Seth Bake- Admissions Coordinator that patient will possibly discharge over the weekend. CSW will continue to follow and assist.  Ernest Pine, MSW, [place], [place] Social Worker [phone]

## 2016-05-14 NOTE — Care Management Important Message (Signed)
Important Message  Patient Details  Name: Chelsea [name] MRN: [id number] Date of Birth: December 09, 1924   Medicare Important Message Given:  Yes    [name] 05/14/2016, 11:05 AM

## 2016-05-14 NOTE — Progress Notes (Signed)
Physical Therapy Treatment Patient Details Name: Chelsea [name] MRN: [id number] DOB: December 31, 1924 Today's Date: 05/14/2016    History of Present Illness Pt is an 82 yr old female presenting with increasing SOB and bilat LE swelling. Admitted for acute on chronic respiratory failure. PMH significant for CHF, COPD, DM, and lymphocytic leukemia. Pt is on 2.5L O2 continuously at baseline.   PT Comments    Pt is progressing towards mobility goals, but still requiring assist and remaining limited by dyspnea on exertion. On 2.5 L of O2, pt requires min A to get EOB and min guard + RW to ambulate 57ft, with SaO2 dropping to 87 and 85% respectively. Activity tolerance limited by O2 desaturation and fatigue. Due to the above, pt does not appear safe to d/c home alone and I continue to recommend STR.   Follow Up Recommendations  SNF     Equipment Recommendations  Rolling walker with 5" wheels    Recommendations for Other Services       Precautions / Restrictions Precautions Precautions: Fall Restrictions Weight Bearing Restrictions: No    Mobility  Bed Mobility Overal bed mobility: Needs Assistance Bed Mobility: Supine to Sit Supine to sit: Min assist General bed mobility comments: Pt able to achieve sit with HOB elevated and use of bed rails, but requires min A at hips to scoot forward to EOB. Increased time required. SOB following this transfer, SaO2 86%.  Transfers Overall transfer level: Needs assistance Equipment used: Rolling walker (2 wheeled) Transfers: Sit to/from Stand Sit to Stand: Supervision General transfer comment: Vc's for foot placement and appropriate DME use. Increased time. No physical assist required.  Ambulation/Gait Ambulation/Gait assistance: Min guard Ambulation Distance (Feet): 55 Feet Assistive device: Rolling walker (2 wheeled) Gait Pattern/deviations: Decreased step length - right;Decreased step length - left;Wide base of support Gait velocity:  Decreased General Gait Details: Vc's for DME use and sequencing; min guard for safety. Significantly decreased gait speed with fatigue noted at end of ambulation. SaO2 noted to be 85%.   Stairs    Wheelchair Mobility    Modified Rankin (Stroke Patients Only)       Balance Overall balance assessment: Needs assistance Sitting-balance support: Bilateral upper extremity supported;Feet supported Sitting balance-Leahy Scale: Good     Standing balance support: Bilateral upper extremity supported (on RW) Standing balance-Leahy Scale: Fair Standing balance comments: requires bilat UE support to maintain dynamic balance   Cognition Arousal/Alertness: Awake/alert Behavior During Therapy: WFL for tasks assessed/performed Overall Cognitive Status: Within Functional Limits for tasks assessed    Exercises General Exercises - Lower Extremity Ankle Circles/Pumps: AROM Quad Sets: AROM Short Arc Quad: AROM Hip ABduction/ADduction: AROM  All exercises performed bilaterally x 10 reps in long sitting.     General Comments General comments (skin integrity, edema, etc.): Bilateral LE pitting edema      Pertinent Vitals/Pain Pain Assessment: No/denies pain  HR monitored throughout session and maintained WFL.  SaO2 as follows on 2.5L O2:    Resting supine upon room entry = 91%    Sitting EOB = 86% recovering to 91% after ~1 minute    Immediately post ambulation = 85% recovering to 92% after ~1 minute    At conclusion of session = 96%           PT Goals (current goals can now be found in the care plan section) Acute Rehab PT Goals Patient Stated Goal: To decrease SOB PT Goal Formulation: With patient Time For Goal Achievement: 05/26/16 Potential to Achieve  Goals: Good Progress towards PT goals: Progressing toward goals    Frequency  Min 2X/week    PT Plan Current plan remains appropriate       End of Session Equipment Utilized During Treatment: Gait belt;Oxygen Activity  Tolerance: Patient limited by fatigue Patient left: in chair;with call bell/phone within reach;with chair alarm set     Time: [id number] PT Time Calculation (min) (ACUTE ONLY): 25 min  Charges:                       G Codes:      Sharde Gover, SPT 05/14/2016, 1:45 PM

## 2016-05-14 NOTE — Discharge Summary (Signed)
[place] at [place] NAME: Chelsea [name]    MR#:  [id number]  DATE OF BIRTH:  Jun 21, 1925  DATE OF ADMISSION:  05/11/2016 ADMITTING PHYSICIAN: Baxter Hire, MD  DATE OF DISCHARGE: 05/15/2016 PRIMARY CARE PHYSICIAN: [name], MD    ADMISSION DIAGNOSIS:  Chronic congestive heart failure, unspecified congestive heart failure type ([place]) [I50.9]   DISCHARGE DIAGNOSIS:   Acute on chronic Diastolic CHF, EF: [id number] - [id number]  Acute on Chronic Respiratory Failure Acute Renal Failure SECONDARY DIAGNOSIS:   Past Medical History  Diagnosis Date  . CHF (congestive heart failure) ([place])   . COPD (chronic obstructive pulmonary disease) ([place])   . Diabetes mellitus ([place])   . Lymphocytic leukemia ([place])     HOSPITAL COURSE:   1. Acute on chronic Diastolic CHF: she was on IV lasix 40 BID. Decreased Lasix to 20 mg by mouth twice a day due to worsening acute renal failure. Hold lasix today. decrease metoprolol 25 mg Q 12 hrs and discontinue enalapril 2.5 mg Q daily due to low BP. Echo: EF: 55% - 65%  2. Acute on Chronic Respiratory Failure: Usually wears 2 liters [place] at home. She was on O2 [place] 4L, weaned to 2 L.  3. Acute Renal Failure: Worsening creatinine. Hold lasix and enalapril. Follow up BMP. 4. Hypokalemia: Improved with potassium supplement.  5. Leukocytosis: Has history of CLL and this seems to be at baseline.  6. DM. On sliding scale.  DISCHARGE CONDITIONS:   Stable, possibly discharge to SNF tomorrow.  CONSULTS OBTAINED:     DRUG ALLERGIES:   Allergies  Allergen Reactions  . Oxycodone-Acetaminophen Other (See Comments)    SEVERE HYPOTENSION  . Allopurinol   . Darvocet [Propoxyphene N-Acetaminophen]   . Gold-Containing Drug Products Other (See Comments)  . Penicillins   . Prednisone     DISCHARGE MEDICATIONS:   Current Discharge Medication List    CONTINUE these medications which have CHANGED   Details  metoprolol  tartrate (LOPRESSOR) 25 MG tablet Take 1 tablet (25 mg total) by mouth 2 (two) times daily.      CONTINUE these medications which have NOT CHANGED   Details  ADVAIR DISKUS 250-50 MCG/DOSE AEPB Inhale 1 puff into the lungs 2 (two) times daily.    aspirin EC 81 MG tablet Take 81 mg by mouth daily.    atorvastatin (LIPITOR) 20 MG tablet Take 20 mg by mouth daily.    Cholecalciferol (VITAMIN D-3 PO) Take by mouth daily.    ferrous sulfate 325 (65 FE) MG tablet Take 325 mg by mouth daily with breakfast.    furosemide (LASIX) 20 MG tablet Take 20 mg by mouth 2 (two) times daily.    JANUVIA 25 MG tablet Take 25 mg by mouth daily.    KLOR-CON M20 20 MEQ tablet Take 20 mEq by mouth daily.    metoCLOPramide (REGLAN) 10 MG tablet Take 10 mg by mouth 4 (four) times daily.    oxybutynin (DITROPAN) 5 MG tablet Take 5 mg by mouth 2 (two) times daily.    OXYGEN Inhale 2 L/min into the lungs continuous.    PROAIR HFA 108 (90 Base) MCG/ACT inhaler Inhale 2 puffs into the lungs every 6 (six) hours as needed. For shortness of breath/wheezing.    saxagliptin HCl (ONGLYZA) 5 MG TABS tablet Take 5 mg by mouth daily.     sucralfate (CARAFATE) 1 G tablet Take 1 g by mouth 4 (four) times daily.  timolol (TIMOPTIC) 0.5 % ophthalmic solution Place 1 drop into both eyes 2 (two) times daily.      STOP taking these medications     enalapril (VASOTEC) 2.5 MG tablet      losartan (COZAAR) 50 MG tablet      TOPROL XL 25 MG 24 hr tablet          DISCHARGE INSTRUCTIONS:    If you experience worsening of your admission symptoms, develop shortness of breath, life threatening emergency, suicidal or homicidal thoughts you must seek medical attention immediately by calling 911 or calling your MD immediately  if symptoms less severe.  You Must read complete instructions/literature along with all the possible adverse reactions/side effects for all the Medicines you take and that have been prescribed to  you. Take any new Medicines after you have completely understood and accept all the possible adverse reactions/side effects.   Please note  You were cared for by a hospitalist during your hospital stay. If you have any questions about your discharge medications or the care you received while you were in the hospital after you are discharged, you can call the unit and asked to speak with the hospitalist on call if the hospitalist that took care of you is not available. Once you are discharged, your primary care physician will handle any further medical issues. Please note that NO REFILLS for any discharge medications will be authorized once you are discharged, as it is imperative that you return to your primary care physician (or establish a relationship with a primary care physician if you do not have one) for your aftercare needs so that they can reassess your need for medications and monitor your lab values.    Today   SUBJECTIVE    No complaint. On O2 [place] 2L.  VITAL SIGNS:  Blood pressure 96/76, pulse 69, temperature 97.6 F (36.4 C), temperature source Oral, resp. rate 20, height 5\' 2"  (1.575 m), weight 183 lb 12.8 oz (83.371 kg), SpO2 100 %.  I/O:   Intake/Output Summary (Last 24 hours) at 05/14/16 1235 Last data filed at 05/14/16 0735  Gross per 24 hour  Intake    720 ml  Output    250 ml  Net    470 ml    PHYSICAL EXAMINATION:  GENERAL:  82 y.o.-year-old patient lying in the bed with no acute distress.  EYES: Pupils equal, round, reactive to light and accommodation. No scleral icterus. Extraocular muscles intact.  HEENT: Head atraumatic, normocephalic. Oropharynx and nasopharynx clear.  NECK:  Supple, no jugular venous distention. No thyroid enlargement, no tenderness.  LUNGS: Normal breath sounds bilaterally, no wheezing, rales,rhonchi or crepitation. No use of accessory muscles of respiration.  CARDIOVASCULAR: S1, S2 normal. No murmurs, rubs, or gallops.  ABDOMEN: Soft,  non-tender, non-distended. Bowel sounds present. No organomegaly or mass.  EXTREMITIES: bilateral leg trace edema, no cyanosis, or clubbing.  NEUROLOGIC: Cranial nerves II through XII are intact. Muscle strength 4/5 in all extremities. Sensation intact. Gait not checked.  PSYCHIATRIC: The patient is alert and oriented x 3.  SKIN: No obvious rash, lesion, or ulcer.   DATA REVIEW:   CBC  Recent Labs Lab 05/13/16 0346  WBC 15.3*  HGB 10.2*  HCT 31.9*  PLT 172    Chemistries   Recent Labs Lab 05/13/16 0346 05/14/16 0442  NA 139 139  K 3.6 3.4*  CL 102 101  CO2 31 30  GLUCOSE 160* 147*  BUN 28* 32*  CREATININE 1.67* 1.84*  CALCIUM 8.7* 8.6*  MG 2.0  --     Cardiac Enzymes  Recent Labs Lab 05/11/16 1357  TROPONINI <0.03    Microbiology Results  Results for orders placed or performed during the hospital encounter of 05/11/16  MRSA PCR Screening     Status: None   Collection Time: 05/13/16  9:47 PM  Result Value Ref Range Status   MRSA by PCR NEGATIVE NEGATIVE Final    Comment:        The GeneXpert MRSA Assay (FDA approved for NASAL specimens only), is one component of a comprehensive MRSA colonization surveillance program. It is not intended to diagnose MRSA infection nor to guide or monitor treatment for MRSA infections.     RADIOLOGY:  No results found.      Management plans discussed with the patient, family and they are in agreement.  CODE STATUS:     Code Status Orders        Start     Ordered   05/11/16 1728  Do not attempt resuscitation (DNR)   Continuous    Question Answer Comment  In the event of cardiac or respiratory ARREST Do not call a "code blue"   In the event of cardiac or respiratory ARREST Do not perform Intubation, CPR, defibrillation or ACLS   In the event of cardiac or respiratory ARREST Use medication by any route, position, wound care, and other measures to relive pain and suffering. May use oxygen, suction and manual  treatment of airway obstruction as needed for comfort.      05/11/16 1727    Code Status History    Date Active Date Inactive Code Status Order ID Comments User Context   03/01/2013 10:04 PM 04/06/2013  4:52 PM Full Code [id number]  Delphine Codjie Inpatient    Advance Directive Documentation        Most Recent Value   Type of Advance Directive  Out of facility DNR (pink MOST or yellow form), Living will, Healthcare Power of Attorney   Pre-existing out of facility DNR order (yellow form or pink MOST form)     "MOST" Form in Place?        TOTAL TIME TAKING CARE OF THIS PATIENT: 36 minutes.    Demetrios Loll M.D on 05/14/2016 at 12:35 PM  Between 7am to 6pm - Pager - [phone]  After 6pm go to [email or web address] - password [email or web address] [facility]  Office  [phone]  CC: Primary care physician; [name], MD

## 2016-05-14 NOTE — Progress Notes (Signed)
CSW was informed that patient may discharge over the weekend. Discharge summary completed by MD Bridgett Larsson and faxed to [facility] via [place]. Per Seth Bake patient will go to room 217 and report to be called to [phone]. CSW will continue to follow and assist.  Ernest Pine, MSW, [place], [place] Social Worker [phone]

## 2016-05-15 LAB — BASIC METABOLIC PANEL
ANION GAP: 6 (ref 5–15)
BUN: 34 mg/dL — ABNORMAL HIGH (ref 6–20)
CALCIUM: 8.9 mg/dL (ref 8.9–10.3)
CO2: 28 mmol/L (ref 22–32)
CREATININE: 1.63 mg/dL — AB (ref 0.44–1.00)
Chloride: 103 mmol/L (ref 101–111)
GFR calc non Af Amer: 27 mL/min — ABNORMAL LOW (ref >60)
GFR, EST AFRICAN AMERICAN: 31 mL/min — AB (ref >60)
Glucose, Bld: 151 mg/dL — ABNORMAL HIGH (ref 65–99)
Potassium: 4 mmol/L (ref 3.5–5.1)
SODIUM: 137 mmol/L (ref 135–145)

## 2016-05-15 LAB — GLUCOSE, CAPILLARY: GLUCOSE-CAPILLARY: 138 mg/dL — AB (ref 65–99)

## 2016-05-15 MED ORDER — FUROSEMIDE 20 MG PO TABS
20.0000 mg | ORAL_TABLET | Freq: Two times a day (BID) | ORAL | Status: DC
  Administered 2016-05-15: 20 mg via ORAL
  Filled 2016-05-15: qty 1

## 2016-05-15 NOTE — Clinical Social Work Note (Signed)
Patient discharging today to [facility] as expected and planned for yesterday. Patient is alert and oriented X4. Patient stated that she is aware that EMS may not be covered if she can ambulate even a short distance but states that she wishes to go by EMS regardless and that she can afford to pay the bill if needed. Patient is on oxygen and becomes short of breath with any exertion. [facility] is not able to transport residents on the weekend. CSW asked if any family could be contacted regarding her discharge and she stated that the only family she has is in [place] and that they probably don't know she is even in the hospital because they are distant relatives. Patient stated she will take care of contacting them if she wishes. Updated discharge information sent via Epic. Shela Leff MSW,LCSW [phone]

## 2016-05-15 NOTE — Clinical Social Work Placement (Signed)
   CLINICAL SOCIAL WORK PLACEMENT  NOTE  Date:  05/15/2016  Patient Details  Name: Chelsea [name] MRN: [id number] Date of Birth: 04-Oct-1925  Clinical Social Work is seeking post-discharge placement for this patient at the [place] level of care (*CSW will initial, date and re-position this form in  chart as items are completed):  Yes   Patient/family provided with [place] Work Department's list of facilities offering this level of care within the geographic area requested by the patient (or if unable, by the patient's family).  Yes   Patient/family informed of their freedom to choose among providers that offer the needed level of care, that participate in Medicare, Medicaid or managed care program needed by the patient, have an available bed and are willing to accept the patient.  Yes   Patient/family informed of [facility]'s ownership interest in [facility] and [facility], as well as of the fact that they are under no obligation to receive care at these facilities.  PASRR submitted to EDS on       PASRR number received on       Existing PASRR number confirmed on 05/14/16     FL2 transmitted to all facilities in geographic area requested by pt/family on 05/14/16     FL2 transmitted to all facilities within larger geographic area on       Patient informed that his/her managed care company has contracts with or will negotiate with certain facilities, including the following:        Yes   Patient/family informed of bed offers received.  Patient chooses bed at  [facility])     Physician recommends and patient chooses bed at  [facility])    Patient to be transferred to [facility] on 05/15/16.  Patient to be transferred to facility by  (EMS)     Patient family notified on 05/15/16 of transfer.  Name of family member notified:   (Patient with only distant relatives out of town and stated that she would contact them and for [place] not to)      PHYSICIAN       Additional Comment:    _______________________________________________ Shela Leff, LCSW 05/15/2016, 9:36 AM

## 2016-05-15 NOTE — Progress Notes (Signed)
Pt. Discharged to [facility] via EMS. Discharge instructions and medication regimen reviewed at bedside with patient. Pt. verbalizes understanding of instructions and medication regimen. Patient assessment unchanged from this morning. TELE and IV discontinued per policy. Report called to Edd Fabian, Therapist, sports at [facility]. EMS has arrived and already taken Chelsea [name].

## 2016-05-15 NOTE — Discharge Summary (Signed)
[place] at [place] NAME: Chelsea [name]    MR#:  [id number]  DATE OF BIRTH:  [birth year]  DATE OF ADMISSION:  05/11/2016 ADMITTING PHYSICIAN: Baxter Hire, MD  DATE OF DISCHARGE: 05/15/2016 PRIMARY CARE PHYSICIAN: [name], MD    ADMISSION DIAGNOSIS:  Chronic congestive heart failure, unspecified congestive heart failure type ([place]) [I50.9]   DISCHARGE DIAGNOSIS:   Acute on chronic Diastolic CHF, EF: [id number] - 123456  Acute on Chronic Respiratory Failure Acute Renal Failure SECONDARY DIAGNOSIS:   Past Medical History  Diagnosis Date  . CHF (congestive heart failure) ([place])   . COPD (chronic obstructive pulmonary disease) ([place])   . Diabetes mellitus ([place])   . Lymphocytic leukemia ([place])     HOSPITAL COURSE:   1. Acute on chronic Diastolic CHF: she was on IV lasix 40 BID. Decreased Lasix to 20 mg by mouth twice a day due to worsening acute renal failure. Hold lasix yesterday. Resume today. decreased metoprolol 25 mg Q 12 hrs and discontinued enalapril 2.5 mg Q daily due to low BP. Echo: EF: 55% - 65%  2. Acute on Chronic Respiratory Failure: Usually wears 2 liters [place] at home. She was on O2 [place] 4L, weaned to 2 L.  3. Acute Renal Failure: Hold lasix and enalapril due to orsening creatinine. Better, Follow up BMP as outpatient. 4. Hypokalemia: Improved with potassium supplement.  5. Leukocytosis: Has history of CLL and this seems to be at baseline.  6. DM. On sliding scale.  DISCHARGE CONDITIONS:   Stable, possibly discharge to SNF tomorrow.  CONSULTS OBTAINED:     DRUG ALLERGIES:   Allergies  Allergen Reactions  . Oxycodone-Acetaminophen Other (See Comments)    SEVERE HYPOTENSION  . Allopurinol   . Darvocet [Propoxyphene N-Acetaminophen]   . Gold-Containing Drug Products Other (See Comments)  . Penicillins   . Prednisone     DISCHARGE MEDICATIONS:   Current Discharge Medication List    CONTINUE these  medications which have CHANGED   Details  metoprolol tartrate (LOPRESSOR) 25 MG tablet Take 1 tablet (25 mg total) by mouth 2 (two) times daily.      CONTINUE these medications which have NOT CHANGED   Details  ADVAIR DISKUS 250-50 MCG/DOSE AEPB Inhale 1 puff into the lungs 2 (two) times daily.    aspirin EC 81 MG tablet Take 81 mg by mouth daily.    atorvastatin (LIPITOR) 20 MG tablet Take 20 mg by mouth daily.    Cholecalciferol (VITAMIN D-3 PO) Take by mouth daily.    ferrous sulfate 325 (65 FE) MG tablet Take 325 mg by mouth daily with breakfast.    furosemide (LASIX) 20 MG tablet Take 20 mg by mouth 2 (two) times daily.    JANUVIA 25 MG tablet Take 25 mg by mouth daily.    KLOR-CON M20 20 MEQ tablet Take 20 mEq by mouth daily.    metoCLOPramide (REGLAN) 10 MG tablet Take 10 mg by mouth 4 (four) times daily.    oxybutynin (DITROPAN) 5 MG tablet Take 5 mg by mouth 2 (two) times daily.    OXYGEN Inhale 2 L/min into the lungs continuous.    PROAIR HFA 108 (90 Base) MCG/ACT inhaler Inhale 2 puffs into the lungs every 6 (six) hours as needed. For shortness of breath/wheezing.    saxagliptin HCl (ONGLYZA) 5 MG TABS tablet Take 5 mg by mouth daily.     sucralfate (CARAFATE) 1 G tablet Take 1 g  by mouth 4 (four) times daily.    timolol (TIMOPTIC) 0.5 % ophthalmic solution Place 1 drop into both eyes 2 (two) times daily.      STOP taking these medications     enalapril (VASOTEC) 2.5 MG tablet      losartan (COZAAR) 50 MG tablet      TOPROL XL 25 MG 24 hr tablet          DISCHARGE INSTRUCTIONS:    If you experience worsening of your admission symptoms, develop shortness of breath, life threatening emergency, suicidal or homicidal thoughts you must seek medical attention immediately by calling 911 or calling your MD immediately  if symptoms less severe.  You Must read complete instructions/literature along with all the possible adverse reactions/side effects for all  the Medicines you take and that have been prescribed to you. Take any new Medicines after you have completely understood and accept all the possible adverse reactions/side effects.   Please note  You were cared for by a hospitalist during your hospital stay. If you have any questions about your discharge medications or the care you received while you were in the hospital after you are discharged, you can call the unit and asked to speak with the hospitalist on call if the hospitalist that took care of you is not available. Once you are discharged, your primary care physician will handle any further medical issues. Please note that NO REFILLS for any discharge medications will be authorized once you are discharged, as it is imperative that you return to your primary care physician (or establish a relationship with a primary care physician if you do not have one) for your aftercare needs so that they can reassess your need for medications and monitor your lab values.    Today   SUBJECTIVE    No complaint. On O2 [place] 2L.  VITAL SIGNS:  Blood pressure 121/50, pulse 67, temperature 98.2 F (36.8 C), temperature source Oral, resp. rate 18, height 5\' 2"  (1.575 m), weight 181 lb 3.2 oz (82.192 kg), SpO2 90 %.  I/O:    Intake/Output Summary (Last 24 hours) at 05/15/16 0857 Last data filed at 05/15/16 0409  Gross per 24 hour  Intake    480 ml  Output    350 ml  Net    130 ml    PHYSICAL EXAMINATION:  GENERAL:  82 y.o.-year-old patient lying in the bed with no acute distress.  EYES: Pupils equal, round, reactive to light and accommodation. No scleral icterus. Extraocular muscles intact.  HEENT: Head atraumatic, normocephalic. Oropharynx and nasopharynx clear.  NECK:  Supple, no jugular venous distention. No thyroid enlargement, no tenderness.  LUNGS: Normal breath sounds bilaterally, no wheezing, rales,rhonchi or crepitation. No use of accessory muscles of respiration.  CARDIOVASCULAR: S1, S2  normal. No murmurs, rubs, or gallops.  ABDOMEN: Soft, non-tender, non-distended. Bowel sounds present. No organomegaly or mass.  EXTREMITIES: bilateral leg trace edema, no cyanosis, or clubbing.  NEUROLOGIC: Cranial nerves II through XII are intact. Muscle strength 4/5 in all extremities. Sensation intact. Gait not checked.  PSYCHIATRIC: The patient is alert and oriented x 3.  SKIN: No obvious rash, lesion, or ulcer.   DATA REVIEW:   CBC  Recent Labs Lab 05/13/16 0346  WBC 15.3*  HGB 10.2*  HCT 31.9*  PLT 172    Chemistries   Recent Labs Lab 05/13/16 0346  05/15/16 0518  NA 139  < > 137  K 3.6  < > 4.0  CL 102  < >  103  CO2 31  < > 28  GLUCOSE 160*  < > 151*  BUN 28*  < > 34*  CREATININE 1.67*  < > 1.63*  CALCIUM 8.7*  < > 8.9  MG 2.0  --   --   < > = values in this interval not displayed.  Cardiac Enzymes  Recent Labs Lab 05/11/16 1357  TROPONINI <0.03    Microbiology Results  Results for orders placed or performed during the hospital encounter of 05/11/16  MRSA PCR Screening     Status: None   Collection Time: 05/13/16  9:47 PM  Result Value Ref Range Status   MRSA by PCR NEGATIVE NEGATIVE Final    Comment:        The GeneXpert MRSA Assay (FDA approved for NASAL specimens only), is one component of a comprehensive MRSA colonization surveillance program. It is not intended to diagnose MRSA infection nor to guide or monitor treatment for MRSA infections.     RADIOLOGY:  No results found.      Management plans discussed with the patient, family and they are in agreement.  CODE STATUS:     Code Status Orders        Start     Ordered   05/11/16 1728  Do not attempt resuscitation (DNR)   Continuous    Question Answer Comment  In the event of cardiac or respiratory ARREST Do not call a "code blue"   In the event of cardiac or respiratory ARREST Do not perform Intubation, CPR, defibrillation or ACLS   In the event of cardiac or  respiratory ARREST Use medication by any route, position, wound care, and other measures to relive pain and suffering. May use oxygen, suction and manual treatment of airway obstruction as needed for comfort.      05/11/16 1727    Code Status History    Date Active Date Inactive Code Status Order ID Comments User Context   03/01/2013 10:04 PM 04/06/2013  4:52 PM Full Code [id number]  Delphine Codjie Inpatient    Advance Directive Documentation        Most Recent Value   Type of Advance Directive  Out of facility DNR (pink MOST or yellow form), Living will, Healthcare Power of Attorney   Pre-existing out of facility DNR order (yellow form or pink MOST form)     "MOST" Form in Place?        TOTAL TIME TAKING CARE OF THIS PATIENT: 32 minutes.    Demetrios Loll M.D on 05/15/2016 at 8:57 AM  Between 7am to 6pm - Pager - [phone]  After 6pm go to [email or web address] - password [email or web address] [facility]  Office  [phone]  CC: Primary care physician; [name], MD

## 2016-05-15 NOTE — Discharge Instructions (Signed)
[facility] appointment on May 28, 2016 at 9:00am with Darylene Price, [place]. Please call [phone] to reschedule.  Heart healthy and ADA diet. Activity as tolerated. Continue home O2 [place] 2 L.

## 2016-05-17 DIAGNOSIS — J438 Other emphysema: Secondary | ICD-10-CM | POA: Diagnosis not present

## 2016-05-17 DIAGNOSIS — N184 Chronic kidney disease, stage 4 (severe): Secondary | ICD-10-CM

## 2016-05-17 DIAGNOSIS — J9611 Chronic respiratory failure with hypoxia: Secondary | ICD-10-CM | POA: Diagnosis not present

## 2016-05-17 DIAGNOSIS — E1121 Type 2 diabetes mellitus with diabetic nephropathy: Secondary | ICD-10-CM | POA: Diagnosis not present

## 2016-05-17 DIAGNOSIS — I5033 Acute on chronic diastolic (congestive) heart failure: Secondary | ICD-10-CM | POA: Diagnosis not present

## 2016-05-27 DIAGNOSIS — J438 Other emphysema: Secondary | ICD-10-CM

## 2016-05-28 ENCOUNTER — Telehealth: Payer: Self-pay | Admitting: Family

## 2016-05-28 ENCOUNTER — Ambulatory Visit: Payer: Medicare Other | Admitting: Family

## 2016-05-28 ENCOUNTER — Encounter: Payer: Self-pay | Admitting: Family

## 2016-05-28 NOTE — Telephone Encounter (Signed)
Patient missed her initial appointment at [facility] on 05/28/16. Will attempt to reschedule.

## 2016-06-07 ENCOUNTER — Ambulatory Visit: Payer: Medicare Other | Attending: Family | Admitting: Family

## 2016-06-07 ENCOUNTER — Encounter: Payer: Self-pay | Admitting: Family

## 2016-06-07 VITALS — BP 138/69 | HR 75 | Resp 24 | Ht 62.0 in | Wt 179.0 lb

## 2016-06-07 DIAGNOSIS — R0601 Orthopnea: Secondary | ICD-10-CM | POA: Diagnosis not present

## 2016-06-07 DIAGNOSIS — J449 Chronic obstructive pulmonary disease, unspecified: Secondary | ICD-10-CM | POA: Diagnosis not present

## 2016-06-07 DIAGNOSIS — R600 Localized edema: Secondary | ICD-10-CM | POA: Insufficient documentation

## 2016-06-07 DIAGNOSIS — R5383 Other fatigue: Secondary | ICD-10-CM | POA: Insufficient documentation

## 2016-06-07 DIAGNOSIS — Z87891 Personal history of nicotine dependence: Secondary | ICD-10-CM | POA: Insufficient documentation

## 2016-06-07 DIAGNOSIS — Z885 Allergy status to narcotic agent status: Secondary | ICD-10-CM | POA: Insufficient documentation

## 2016-06-07 DIAGNOSIS — E119 Type 2 diabetes mellitus without complications: Secondary | ICD-10-CM | POA: Insufficient documentation

## 2016-06-07 DIAGNOSIS — I5032 Chronic diastolic (congestive) heart failure: Secondary | ICD-10-CM | POA: Insufficient documentation

## 2016-06-07 DIAGNOSIS — Z880 Allergy status to penicillin: Secondary | ICD-10-CM | POA: Insufficient documentation

## 2016-06-07 DIAGNOSIS — Z9109 Other allergy status, other than to drugs and biological substances: Secondary | ICD-10-CM | POA: Diagnosis not present

## 2016-06-07 DIAGNOSIS — Z888 Allergy status to other drugs, medicaments and biological substances status: Secondary | ICD-10-CM | POA: Insufficient documentation

## 2016-06-07 DIAGNOSIS — R0602 Shortness of breath: Secondary | ICD-10-CM | POA: Diagnosis not present

## 2016-06-07 DIAGNOSIS — Z9981 Dependence on supplemental oxygen: Secondary | ICD-10-CM | POA: Diagnosis not present

## 2016-06-07 DIAGNOSIS — Z9049 Acquired absence of other specified parts of digestive tract: Secondary | ICD-10-CM | POA: Diagnosis not present

## 2016-06-07 DIAGNOSIS — I214 Non-ST elevation (NSTEMI) myocardial infarction: Secondary | ICD-10-CM | POA: Diagnosis not present

## 2016-06-07 DIAGNOSIS — Z833 Family history of diabetes mellitus: Secondary | ICD-10-CM | POA: Diagnosis not present

## 2016-06-07 DIAGNOSIS — Z7902 Long term (current) use of antithrombotics/antiplatelets: Secondary | ICD-10-CM | POA: Insufficient documentation

## 2016-06-07 DIAGNOSIS — H9190 Unspecified hearing loss, unspecified ear: Secondary | ICD-10-CM | POA: Diagnosis not present

## 2016-06-07 DIAGNOSIS — Z7982 Long term (current) use of aspirin: Secondary | ICD-10-CM | POA: Diagnosis not present

## 2016-06-07 DIAGNOSIS — I10 Essential (primary) hypertension: Secondary | ICD-10-CM | POA: Insufficient documentation

## 2016-06-07 DIAGNOSIS — Z8249 Family history of ischemic heart disease and other diseases of the circulatory system: Secondary | ICD-10-CM | POA: Diagnosis not present

## 2016-06-07 DIAGNOSIS — Z90710 Acquired absence of both cervix and uterus: Secondary | ICD-10-CM | POA: Diagnosis not present

## 2016-06-07 DIAGNOSIS — M7989 Other specified soft tissue disorders: Secondary | ICD-10-CM | POA: Insufficient documentation

## 2016-06-07 DIAGNOSIS — C9190 Lymphoid leukemia, unspecified not having achieved remission: Secondary | ICD-10-CM | POA: Insufficient documentation

## 2016-06-07 NOTE — Progress Notes (Signed)
Subjective:    Patient ID: Chelsea [name], female    DOB: Dec 10, 1924, 82 y.o.   MRN: [id number]  Congestive Heart Failure  Presents for initial visit. The disease course has been stable. Associated symptoms include edema, fatigue, orthopnea (sleeping in the recliner) and shortness of breath. Pertinent negatives include no abdominal pain, chest pain or palpitations. The symptoms have been stable. Past treatments include oxygen, beta blockers and salt and fluid restriction. The treatment provided mild relief. Compliance with prior treatments has been good. Her past medical history is significant for CAD, chronic lung disease and DM. There is no history of HTN. She has one 1st degree relative with heart disease.  Shortness of Breath  This is a chronic (COPD) problem. The current episode started more than 1 year ago. The problem occurs daily. The problem has been gradually improving. Associated symptoms include leg swelling. Pertinent negatives include no abdominal pain, chest pain, headaches, neck pain, PND, rhinorrhea, sore throat or wheezing. The symptoms are aggravated by exercise and any activity. The patient has no known risk factors for DVT/PE. She has tried beta agonist inhalers and steroid inhalers for the symptoms. The treatment provided moderate relief. Her past medical history is significant for CAD, chronic lung disease and COPD.   Past Medical History:  Diagnosis Date  . CHF (congestive heart failure) ([place])   . COPD (chronic obstructive pulmonary disease) ([place])   . Diabetes mellitus ()   . Lymphocytic leukemia ([place])   . NSTEMI     Past Surgical History:  Procedure Laterality Date  . ABDOMINAL HYSTERECTOMY    . APPENDECTOMY    . CHOLECYSTECTOMY    . ESOPHAGOGASTRODUODENOSCOPY N/A 03/30/2013   Procedure: ESOPHAGOGASTRODUODENOSCOPY (EGD);  Surgeon: Lafayette Dragon, MD;  Location: [facility] ENDOSCOPY;  Service: Endoscopy;  Laterality: N/A;    Family History  Problem Relation Age of Onset  .  Heart attack Mother   . Diabetes Mother   . Hypertension Father     Social History  Substance Use Topics  . Smoking status: Former Smoker    Years: 30.00    Quit date: 11/08/1970  . Smokeless tobacco: Never Used  . Alcohol use No    Allergies  Allergen Reactions  . Oxycodone-Acetaminophen Other (See Comments)    SEVERE HYPOTENSION  . Allopurinol   . Darvocet [Propoxyphene N-Acetaminophen]   . Gold-Containing Drug Products Other (See Comments)  . Penicillins   . Prednisone     Prior to Admission medications   Medication Sig Start Date End Date Taking? Authorizing Provider  ADVAIR DISKUS 250-50 MCG/DOSE AEPB Inhale 1 puff into the lungs 2 (two) times daily. 04/08/16  Yes Historical Provider, MD  aspirin EC 81 MG tablet Take 81 mg by mouth daily.   Yes Historical Provider, MD  atorvastatin (LIPITOR) 20 MG tablet Take 20 mg by mouth daily.   Yes Historical Provider, MD  furosemide (LASIX) 20 MG tablet Take 40 mg by mouth 2 (two) times daily.    Yes Historical Provider, MD  JANUVIA 25 MG tablet Take 25 mg by mouth daily. 04/20/16  Yes Historical Provider, MD  KLOR-CON M20 20 MEQ tablet Take 20 mEq by mouth daily. 05/04/16  Yes Historical Provider, MD  metoprolol tartrate (LOPRESSOR) 25 MG tablet Take 1 tablet (25 mg total) by mouth 2 (two) times daily. 05/14/16  Yes Demetrios Loll, MD  OXYGEN Inhale 2 L/min into the lungs continuous.   Yes Historical Provider, MD  PROAIR HFA 108 [phone] Base) MCG/ACT inhaler  Inhale 2 puffs into the lungs every 6 (six) hours as needed. For shortness of breath/wheezing. 04/08/16  Yes Historical Provider, MD  sucralfate (CARAFATE) 1 G tablet Take 1 g by mouth 4 (four) times daily.   Yes Historical Provider, MD  timolol (TIMOPTIC) 0.5 % ophthalmic solution Place 1 drop into both eyes 2 (two) times daily. 03/19/16  Yes Historical Provider, MD     Review of Systems  Constitutional: Positive for fatigue. Negative for appetite change.  HENT: Positive for hearing loss.  Negative for congestion, rhinorrhea and sore throat.   Eyes: Negative.   Respiratory: Positive for shortness of breath. Negative for chest tightness and wheezing.   Cardiovascular: Positive for leg swelling. Negative for chest pain, palpitations and PND.  Gastrointestinal: Negative for abdominal pain.  Endocrine: Negative.   Genitourinary: Negative.   Musculoskeletal: Negative for back pain and neck pain.  Skin: Negative.   Allergic/Immunologic: Negative.   Neurological: Negative for dizziness, light-headedness and headaches.  Hematological: Negative for adenopathy. Does not bruise/bleed easily.  Psychiatric/Behavioral: Positive for sleep disturbance (sleeping in recliner, oxygen @ 3L). Negative for dysphoric mood. The patient is not nervous/anxious.        Objective:   Physical Exam  Constitutional: She is oriented to person, place, and time. She appears well-developed and well-nourished.  HENT:  Head: Normocephalic and atraumatic.  Eyes: Conjunctivae are normal. Pupils are equal, round, and reactive to light.  Neck: Normal range of motion. Neck supple.  Cardiovascular: Normal rate and regular rhythm.   Pulmonary/Chest: Effort normal. She has no wheezes. She has no rales.  Abdominal: Soft. She exhibits no distension. There is no tenderness.  Musculoskeletal: Normal range of motion. She exhibits edema (1+ pitting edema in bilateral lower legs). She exhibits no tenderness.  Neurological: She is alert and oriented to person, place, and time.  Skin: Skin is warm and dry.  Psychiatric: She has a normal mood and affect. Her behavior is normal. Thought content normal.  Nursing note and vitals reviewed.  BP 138/69   Pulse 75   Resp (!) 24   Ht 5\' 2"  (1.575 m)   Wt 179 lb (81.2 kg)   SpO2 100% Comment: on 3L  BMI 32.74 kg/m         Assessment & Plan:  1: Chronic heart failure with preserved ejection fraction- Patient presents with fatigue and shortness of breath with little  exertion (Class III). Her personal oxygen tank was empty when she got here so she feels like she was more short of breath than usual. Placed on 3L oxygen upon arrival to the office with improvement of her breathing. She says that she gets short of breath when bathing, getting dressed or even walking a very short distance. She is already weighing herself daily and says that her weight has been stable. Instructed to call for an overnight weight gain of >2 pounds or a weekly weight gain of >5 pounds. She is not adding salt to her food and has been ordered a low sodium diet at [facility]. Discussed the importance of eating low sodium foods and written dietary information was given to her. She's recently had her diuretic increased a few days ago to 40mg  twice daily and both her and her caregiver feel like her symptoms are a little better. PCP's note lists cozaar but patient's current med list does not have that medication listed.  2: COPD- Patient was very short of breath just stepping on the scale but, again, she had been  without her oxygen for at least 15 minutes. O2 sat was 100% on 3L after just a few minutes. Currently using advair twice daily and albuterol as needed. Discussed speaking with her PCP about possible pulmonology referral. 3: Diabetes- She says that she didn't check it today but yesterday it was in the 120's. Being followed by her PCP regarding this.  Medication list was reviewed with the patient and the caregiver.   Return in 1 month or sooner for any questions/problems before then.

## 2016-06-07 NOTE — Patient Instructions (Signed)
Continue weighing daily and call for an overnight weight gain of > 2 pounds or a weekly weight gain of >5 pounds. 

## 2016-06-28 ENCOUNTER — Ambulatory Visit: Payer: Medicare Other | Attending: Family | Admitting: Family

## 2016-06-28 ENCOUNTER — Encounter: Payer: Self-pay | Admitting: Family

## 2016-06-28 VITALS — BP 123/46 | HR 74 | Resp 22 | Ht 62.0 in | Wt 174.0 lb

## 2016-06-28 DIAGNOSIS — I890 Lymphedema, not elsewhere classified: Secondary | ICD-10-CM | POA: Insufficient documentation

## 2016-06-28 DIAGNOSIS — Z79899 Other long term (current) drug therapy: Secondary | ICD-10-CM | POA: Diagnosis not present

## 2016-06-28 DIAGNOSIS — Z9981 Dependence on supplemental oxygen: Secondary | ICD-10-CM | POA: Insufficient documentation

## 2016-06-28 DIAGNOSIS — Z880 Allergy status to penicillin: Secondary | ICD-10-CM | POA: Insufficient documentation

## 2016-06-28 DIAGNOSIS — N189 Chronic kidney disease, unspecified: Secondary | ICD-10-CM | POA: Diagnosis not present

## 2016-06-28 DIAGNOSIS — Z9889 Other specified postprocedural states: Secondary | ICD-10-CM | POA: Insufficient documentation

## 2016-06-28 DIAGNOSIS — I130 Hypertensive heart and chronic kidney disease with heart failure and stage 1 through stage 4 chronic kidney disease, or unspecified chronic kidney disease: Secondary | ICD-10-CM | POA: Insufficient documentation

## 2016-06-28 DIAGNOSIS — Z8249 Family history of ischemic heart disease and other diseases of the circulatory system: Secondary | ICD-10-CM | POA: Insufficient documentation

## 2016-06-28 DIAGNOSIS — J449 Chronic obstructive pulmonary disease, unspecified: Secondary | ICD-10-CM | POA: Insufficient documentation

## 2016-06-28 DIAGNOSIS — H409 Unspecified glaucoma: Secondary | ICD-10-CM | POA: Insufficient documentation

## 2016-06-28 DIAGNOSIS — Z9049 Acquired absence of other specified parts of digestive tract: Secondary | ICD-10-CM | POA: Insufficient documentation

## 2016-06-28 DIAGNOSIS — Z7982 Long term (current) use of aspirin: Secondary | ICD-10-CM | POA: Diagnosis not present

## 2016-06-28 DIAGNOSIS — I10 Essential (primary) hypertension: Secondary | ICD-10-CM

## 2016-06-28 DIAGNOSIS — E119 Type 2 diabetes mellitus without complications: Secondary | ICD-10-CM

## 2016-06-28 DIAGNOSIS — I509 Heart failure, unspecified: Secondary | ICD-10-CM | POA: Diagnosis not present

## 2016-06-28 DIAGNOSIS — Z90710 Acquired absence of both cervix and uterus: Secondary | ICD-10-CM | POA: Diagnosis not present

## 2016-06-28 DIAGNOSIS — E1122 Type 2 diabetes mellitus with diabetic chronic kidney disease: Secondary | ICD-10-CM | POA: Diagnosis not present

## 2016-06-28 DIAGNOSIS — Z833 Family history of diabetes mellitus: Secondary | ICD-10-CM | POA: Diagnosis not present

## 2016-06-28 DIAGNOSIS — Z888 Allergy status to other drugs, medicaments and biological substances status: Secondary | ICD-10-CM | POA: Diagnosis not present

## 2016-06-28 DIAGNOSIS — Z87891 Personal history of nicotine dependence: Secondary | ICD-10-CM | POA: Insufficient documentation

## 2016-06-28 DIAGNOSIS — Z9109 Other allergy status, other than to drugs and biological substances: Secondary | ICD-10-CM | POA: Diagnosis not present

## 2016-06-28 DIAGNOSIS — I5032 Chronic diastolic (congestive) heart failure: Secondary | ICD-10-CM

## 2016-06-28 NOTE — Patient Instructions (Signed)
Continue weighing daily and call for an overnight weight gain of > 2 pounds or a weekly weight gain of >5 pounds. 

## 2016-06-28 NOTE — Progress Notes (Signed)
Subjective:    Patient ID: Chelsea [name], female    DOB: Mar 10, 1925, 82 y.o.   MRN: [id number]  Congestive Heart Failure  Presents for follow-up visit. The disease course has been stable. Associated symptoms include fatigue and shortness of breath. Pertinent negatives include no abdominal pain, chest pain, edema, orthopnea or palpitations. The symptoms have been stable. Past treatments include beta blockers, salt and fluid restriction and oxygen. The treatment provided mild relief. Compliance with prior treatments has been good. Her past medical history is significant for chronic lung disease, DM and HTN. She has one 1st degree relative with heart disease.  Hypertension  This is a chronic problem. The current episode started more than 1 year ago. The problem is unchanged. The problem is controlled. Associated symptoms include shortness of breath. Pertinent negatives include no chest pain, neck pain, palpitations or peripheral edema. There are no associated agents to hypertension. Risk factors for coronary artery disease include diabetes mellitus, dyslipidemia, family history, obesity and post-menopausal state. Past treatments include beta blockers, diuretics and lifestyle changes. The current treatment provides moderate improvement. Compliance problems include exercise.  Hypertensive end-organ damage includes kidney disease and heart failure.   Past Medical History:  Diagnosis Date  . CHF (congestive heart failure) ([place])   . Chronic kidney disease   . CLL (chronic lymphocytic leukemia) ([place])   . COPD (chronic obstructive pulmonary disease) ([place])   . Diabetes mellitus ([place])   . Glaucoma   . Hyperlipidemia   . Hypertension   . Lymphedema   . Lymphocytic leukemia ([place])   . NSTEMI     Past Surgical History:  Procedure Laterality Date  . ABDOMINAL HYSTERECTOMY    . APPENDECTOMY    . CHOLECYSTECTOMY    . ESOPHAGOGASTRODUODENOSCOPY N/A 03/30/2013   Procedure: ESOPHAGOGASTRODUODENOSCOPY (EGD);   Surgeon: Lafayette Dragon, MD;  Location: [facility] ENDOSCOPY;  Service: Endoscopy;  Laterality: N/A;    Family History  Problem Relation Age of Onset  . Heart attack Mother   . Diabetes Mother   . Hypertension Father     Social History  Substance Use Topics  . Smoking status: Former Smoker    Years: 30.00    Quit date: 11/08/1970  . Smokeless tobacco: Never Used  . Alcohol use No    Allergies  Allergen Reactions  . Oxycodone-Acetaminophen Other (See Comments)    SEVERE HYPOTENSION  . Allopurinol   . Darvocet [Propoxyphene N-Acetaminophen]   . Gold-Containing Drug Products Other (See Comments)  . Penicillins   . Prednisone     Prior to Admission medications   Medication Sig Start Date End Date Taking? Authorizing Provider  ADVAIR DISKUS 250-50 MCG/DOSE AEPB Inhale 1 puff into the lungs 2 (two) times daily. 04/08/16  Yes Historical Provider, MD  aspirin EC 81 MG tablet Take 81 mg by mouth daily.   Yes Historical Provider, MD  atorvastatin (LIPITOR) 20 MG tablet Take 20 mg by mouth daily.   Yes Historical Provider, MD  furosemide (LASIX) 20 MG tablet Take 40 mg by mouth 2 (two) times daily.    Yes Historical Provider, MD  JANUVIA 25 MG tablet Take 25 mg by mouth daily. 04/20/16  Yes Historical Provider, MD  KLOR-CON M20 20 MEQ tablet Take 20 mEq by mouth daily. 05/04/16  Yes Historical Provider, MD  metoprolol tartrate (LOPRESSOR) 25 MG tablet Take 1 tablet (25 mg total) by mouth 2 (two) times daily. 05/14/16  Yes Demetrios Loll, MD  OXYGEN Inhale 2 L/min into the lungs  continuous.   Yes Historical Provider, MD  PROAIR HFA 108 (90 Base) MCG/ACT inhaler Inhale 2 puffs into the lungs every 6 (six) hours as needed. For shortness of breath/wheezing. 04/08/16  Yes Historical Provider, MD  sucralfate (CARAFATE) 1 G tablet Take 1 g by mouth 4 (four) times daily.   Yes Historical Provider, MD  timolol (TIMOPTIC) 0.5 % ophthalmic solution Place 1 drop into both eyes 2 (two) times daily. 03/19/16  Yes Historical  Provider, MD      Review of Systems  Constitutional: Positive for fatigue. Negative for appetite change.  HENT: Negative for congestion, postnasal drip and sore throat.   Eyes: Negative.   Respiratory: Positive for shortness of breath. Negative for cough and chest tightness.   Cardiovascular: Negative for chest pain, palpitations and leg swelling.  Gastrointestinal: Negative for abdominal distention and abdominal pain.  Endocrine: Negative.   Genitourinary: Negative.   Musculoskeletal: Negative for back pain and neck pain.  Skin: Negative.   Allergic/Immunologic: Negative.   Neurological: Negative for dizziness and light-headedness.  Hematological: Negative for adenopathy. Does not bruise/bleed easily.  Psychiatric/Behavioral: Positive for sleep disturbance (chronic difficulty sleeping. Wearing oxygen @ 3L around the clock.). Negative for dysphoric mood. The patient is not nervous/anxious.        Objective:   Physical Exam  Constitutional: She is oriented to person, place, and time. She appears well-developed and well-nourished.  HENT:  Head: Normocephalic and atraumatic.  Eyes: Conjunctivae are normal. Pupils are equal, round, and reactive to light.  Neck: Normal range of motion. Neck supple.  Cardiovascular: Normal rate and regular rhythm.   Pulmonary/Chest: Effort normal. She has no wheezes. She has no rales.  Abdominal: Soft. She exhibits no distension. There is no tenderness.  Musculoskeletal: She exhibits no edema or tenderness.  Neurological: She is alert and oriented to person, place, and time.  Skin: Skin is warm and dry.  Psychiatric: She has a normal mood and affect. Her behavior is normal. Thought content normal.  Nursing note and vitals reviewed.  BP (!) 123/46   Pulse 74   Resp (!) 22   Ht 5\' 2"  (1.575 m)   Wt 174 lb (78.9 kg)   SpO2 100% Comment: 3L  BMI 31.83 kg/m         Assessment & Plan:  1: Chronic heart failure with preserved ejection  fraction- Patient presents with fatigue and shortness of breath with little exertion (Class III). She denies any swelling in her legs or abdomen. She continues to weigh herself and has lost a few pounds since she was last here. By our scale, she's lost 4.4 pounds since she was last here on 06/07/16. Reminded to call for an overnight weight gain of >2 pounds or a weekly weight gain of > 5 pounds. She is not adding any salt to her food and is trying to eat low sodium foods. Last saw her cardiologist ([facility]) 01/21/16 and is due to return in September 2017. 2: HTN- Blood pressure looks good today. Continue medications at this time. 3: COPD- She continues to wear her oxygen at 3L around the clock. She does report shortness of breath even at rest due to this. Does practice her breathing exercises which does seem to help quite a bit. She last saw her PCP (Bronstein) on 06/03/16.  Patient did not bring her medications nor a list. Each medication was verbally reviewed with the patient and she was encouraged to bring the bottles to every visit to confirm accuracy of  list.  Patient's main caregiver is having surgery soon and patient would like to wait until her caregiver returns to work in October before making her next appointment Caregiver says that she will call us back after she returns to work sometime in October 2017.

## 2017-02-03 ENCOUNTER — Inpatient Hospital Stay: Payer: Medicare Other | Admitting: Internal Medicine

## 2017-02-03 ENCOUNTER — Inpatient Hospital Stay: Payer: Medicare Other

## 2017-02-23 ENCOUNTER — Inpatient Hospital Stay: Payer: Medicare Other | Attending: Internal Medicine

## 2017-02-23 ENCOUNTER — Inpatient Hospital Stay (HOSPITAL_BASED_OUTPATIENT_CLINIC_OR_DEPARTMENT_OTHER): Payer: Medicare Other | Admitting: Internal Medicine

## 2017-02-23 DIAGNOSIS — C9110 Chronic lymphocytic leukemia of B-cell type not having achieved remission: Secondary | ICD-10-CM | POA: Insufficient documentation

## 2017-02-23 DIAGNOSIS — Z87891 Personal history of nicotine dependence: Secondary | ICD-10-CM | POA: Diagnosis not present

## 2017-02-23 DIAGNOSIS — Z7984 Long term (current) use of oral hypoglycemic drugs: Secondary | ICD-10-CM | POA: Diagnosis not present

## 2017-02-23 DIAGNOSIS — Z7982 Long term (current) use of aspirin: Secondary | ICD-10-CM | POA: Insufficient documentation

## 2017-02-23 DIAGNOSIS — I509 Heart failure, unspecified: Secondary | ICD-10-CM

## 2017-02-23 DIAGNOSIS — E785 Hyperlipidemia, unspecified: Secondary | ICD-10-CM | POA: Diagnosis not present

## 2017-02-23 DIAGNOSIS — J449 Chronic obstructive pulmonary disease, unspecified: Secondary | ICD-10-CM | POA: Diagnosis not present

## 2017-02-23 DIAGNOSIS — I129 Hypertensive chronic kidney disease with stage 1 through stage 4 chronic kidney disease, or unspecified chronic kidney disease: Secondary | ICD-10-CM | POA: Diagnosis not present

## 2017-02-23 DIAGNOSIS — Z90722 Acquired absence of ovaries, bilateral: Secondary | ICD-10-CM

## 2017-02-23 DIAGNOSIS — N189 Chronic kidney disease, unspecified: Secondary | ICD-10-CM

## 2017-02-23 DIAGNOSIS — E119 Type 2 diabetes mellitus without complications: Secondary | ICD-10-CM | POA: Insufficient documentation

## 2017-02-23 DIAGNOSIS — I252 Old myocardial infarction: Secondary | ICD-10-CM | POA: Diagnosis not present

## 2017-02-23 DIAGNOSIS — M7989 Other specified soft tissue disorders: Secondary | ICD-10-CM

## 2017-02-23 DIAGNOSIS — Z90710 Acquired absence of both cervix and uterus: Secondary | ICD-10-CM

## 2017-02-23 DIAGNOSIS — R0602 Shortness of breath: Secondary | ICD-10-CM

## 2017-02-23 DIAGNOSIS — Z79899 Other long term (current) drug therapy: Secondary | ICD-10-CM | POA: Insufficient documentation

## 2017-02-23 DIAGNOSIS — D72829 Elevated white blood cell count, unspecified: Secondary | ICD-10-CM

## 2017-02-23 LAB — COMPREHENSIVE METABOLIC PANEL
ALBUMIN: 4.1 g/dL (ref 3.5–5.0)
ALK PHOS: 73 U/L (ref 38–126)
ALT: 19 U/L (ref 14–54)
ANION GAP: 8 (ref 5–15)
AST: 19 U/L (ref 15–41)
BILIRUBIN TOTAL: 0.8 mg/dL (ref 0.3–1.2)
BUN: 49 mg/dL — AB (ref 6–20)
CALCIUM: 9.3 mg/dL (ref 8.9–10.3)
CO2: 23 mmol/L (ref 22–32)
Chloride: 104 mmol/L (ref 101–111)
Creatinine, Ser: 2.15 mg/dL — ABNORMAL HIGH (ref 0.44–1.00)
GFR calc Af Amer: 22 mL/min — ABNORMAL LOW (ref >60)
GFR, EST NON AFRICAN AMERICAN: 19 mL/min — AB (ref >60)
GLUCOSE: 168 mg/dL — AB (ref 65–99)
POTASSIUM: 4.3 mmol/L (ref 3.5–5.1)
Sodium: 135 mmol/L (ref 135–145)
TOTAL PROTEIN: 7.3 g/dL (ref 6.5–8.1)

## 2017-02-23 LAB — CBC WITH DIFFERENTIAL/PLATELET
Basophils Absolute: 0.1 10*3/uL (ref 0–0.1)
Basophils Relative: 0 %
Eosinophils Absolute: 0.3 10*3/uL (ref 0–0.7)
Eosinophils Relative: 2 %
HEMATOCRIT: 36.1 % (ref 35.0–47.0)
HEMOGLOBIN: 12 g/dL (ref 12.0–16.0)
LYMPHS ABS: 12.5 10*3/uL — AB (ref 1.0–3.6)
LYMPHS PCT: 58 %
MCH: 29.8 pg (ref 26.0–34.0)
MCHC: 33.2 g/dL (ref 32.0–36.0)
MCV: 89.7 fL (ref 80.0–100.0)
MONO ABS: 0.6 10*3/uL (ref 0.2–0.9)
MONOS PCT: 3 %
NEUTROS ABS: 7.9 10*3/uL — AB (ref 1.4–6.5)
NEUTROS PCT: 37 %
Platelets: 178 10*3/uL (ref 150–440)
RBC: 4.02 MIL/uL (ref 3.80–5.20)
RDW: 15.4 % — AB (ref 11.5–14.5)
WBC: 21.4 10*3/uL — ABNORMAL HIGH (ref 3.6–11.0)

## 2017-02-23 NOTE — Progress Notes (Signed)
[place] OFFICE PROGRESS NOTE  Patient Care Team: [name], MD as PCP - General (Family Medicine)   SUMMARY OF ONCOLOGIC HISTORY:  Oncology History   # CLL Rai Stage- 0   # CKD [creat 1.7- sep 2015 ]  # COPD on 2 lits chronic.      CLL (chronic lymphocytic leukemia) ([place])   02/23/2017 Initial Diagnosis    CLL (chronic lymphocytic leukemia) ([facility])       INTERVAL HISTORY:  83 year old female patient with above history of CLL currently on surveillance is here for follow-up. Patient denies any weight loss or recent pneumonias or any lumps or bumps or any fevers.  She is chronic shortness of breath, COPD she is on home O2. Chronic mild swelling of the legs not any worse. Otherwise no nausea no vomiting abdominal pain. The patient noted to have a nonhealing ulcer of the left shin over the last 1 year. Has not gotten any worse or bleeding. She currently lives in a nursing home. She is accompanied by her caregiver.   REVIEW OF SYSTEMS:  A complete 10 point review of system is done which is negative except mentioned above/history of present illness.   PAST MEDICAL HISTORY :  Past Medical History:  Diagnosis Date  . CHF (congestive heart failure) ([place])   . Chronic kidney disease   . CLL (chronic lymphocytic leukemia) ([place])   . COPD (chronic obstructive pulmonary disease) ([place])   . Diabetes mellitus ([place])   . Glaucoma   . Hyperlipidemia   . Hypertension   . Lymphedema   . Lymphocytic leukemia ([place])   . NSTEMI     PAST SURGICAL HISTORY :   Past Surgical History:  Procedure Laterality Date  . ABDOMINAL HYSTERECTOMY    . APPENDECTOMY    . CHOLECYSTECTOMY    . ESOPHAGOGASTRODUODENOSCOPY N/A 03/30/2013   Procedure: ESOPHAGOGASTRODUODENOSCOPY (EGD);  Surgeon: Lafayette Dragon, MD;  Location: [facility] ENDOSCOPY;  Service: Endoscopy;  Laterality: N/A;    FAMILY HISTORY :   Family History  Problem Relation Age of Onset  . Heart attack Mother   . Diabetes Mother    . Hypertension Father     SOCIAL HISTORY:   Social History  Substance Use Topics  . Smoking status: Former Smoker    Years: 30.00    Quit date: 11/08/1970  . Smokeless tobacco: Never Used  . Alcohol use No    ALLERGIES:  is allergic to oxycodone-acetaminophen; allopurinol; darvocet [propoxyphene n-acetaminophen]; gold-containing drug products; penicillins; and prednisone.  MEDICATIONS:  Current Outpatient Prescriptions  Medication Sig Dispense Refill  . ADVAIR DISKUS 250-50 MCG/DOSE AEPB Inhale 1 puff into the lungs 2 (two) times daily.    Marland Kitchen aspirin EC 81 MG tablet Take 81 mg by mouth daily.    Marland Kitchen atorvastatin (LIPITOR) 20 MG tablet Take 20 mg by mouth daily.    . furosemide (LASIX) 20 MG tablet Take 40 mg by mouth 2 (two) times daily.     Marland Kitchen glimepiride (AMARYL) 2 MG tablet Take 1 tablet by mouth daily.    Marland Kitchen JANUVIA 25 MG tablet Take 25 mg by mouth daily.    Marland Kitchen KLOR-CON M20 20 MEQ tablet Take 20 mEq by mouth daily.    Marland Kitchen losartan (COZAAR) 50 MG tablet Take 1 tablet by mouth daily.    . metoprolol tartrate (LOPRESSOR) 25 MG tablet Take 1 tablet (25 mg total) by mouth 2 (two) times daily.    . OXYGEN Inhale 2 L/min into the  lungs continuous.    Marland Kitchen PROAIR HFA 108 (90 Base) MCG/ACT inhaler Inhale 2 puffs into the lungs every 6 (six) hours as needed. For shortness of breath/wheezing.    . sucralfate (CARAFATE) 1 G tablet Take 1 g by mouth 4 (four) times daily.    . timolol (TIMOPTIC) 0.5 % ophthalmic solution Place 1 drop into both eyes 2 (two) times daily.     No current facility-administered medications for this visit.     PHYSICAL EXAMINATION:   BP (!) 153/71   Pulse 78   Temp 97.8 F (36.6 C) (Tympanic)   Resp (!) 24   SpO2 98% Comment: 2L  There were no vitals filed for this visit.  GENERAL: Well-nourished well-developed; Alert, Mild respiratory distress/chronic pursed  lip breathing  and comfortable. Alone. Accompanied by caregiver. EYES: no pallor or icterus OROPHARYNX:  no thrush or ulceration; good dentition  NECK: supple, no masses felt LYMPH:  no palpable lymphadenopathy in the cervical, axillary or inguinal regions LUNGS:  decreased pretzels bilaterally. No wheeze or crackles HEART/CVS: regular rate & rhythm and no murmurs; No lower extremity edema ABDOMEN:abdomen soft, non-tender and normal bowel sounds Musculoskeletal:no cyanosis of digits and no clubbing  PSYCH: alert & oriented x 3 with fluent speech NEURO: no focal motor/sensory deficits SKIN:  no rashes or significant lesions; one to 2 cm ulcerative lesion noted in the left shin. No active bleeding.  LABORATORY DATA:  I have reviewed the data as listed    Component Value Date/Time   NA 135 02/23/2017 1012   NA 139 07/19/2014 1910   K 4.3 02/23/2017 1012   K 4.0 07/19/2014 1910   CL 104 02/23/2017 1012   CL 108 (H) 07/19/2014 1910   CO2 23 02/23/2017 1012   CO2 20 (L) 07/19/2014 1910   GLUCOSE 168 (H) 02/23/2017 1012   GLUCOSE 141 (H) 07/19/2014 1910   BUN 49 (H) 02/23/2017 1012   BUN 36 (H) 07/19/2014 1910   CREATININE 2.15 (H) 02/23/2017 1012   CREATININE 1.73 (H) 07/19/2014 1910   CALCIUM 9.3 02/23/2017 1012   CALCIUM 9.2 07/19/2014 1910   PROT 7.3 02/23/2017 1012   PROT 6.9 07/19/2014 1910   ALBUMIN 4.1 02/23/2017 1012   ALBUMIN 3.7 07/19/2014 1910   AST 19 02/23/2017 1012   AST 16 07/19/2014 1910   ALT 19 02/23/2017 1012   ALT 18 07/19/2014 1910   ALKPHOS 73 02/23/2017 1012   ALKPHOS 74 07/19/2014 1910   BILITOT 0.8 02/23/2017 1012   BILITOT 0.5 07/19/2014 1910   GFRNONAA 19 (L) 02/23/2017 1012   GFRNONAA 26 (L) 07/19/2014 1910   GFRAA 22 (L) 02/23/2017 1012   GFRAA 30 (L) 07/19/2014 1910    No results found for: SPEP, UPEP  Lab Results  Component Value Date   WBC 21.4 (H) 02/23/2017   NEUTROABS 7.9 (H) 02/23/2017   HGB 12.0 02/23/2017   HCT 36.1 02/23/2017   MCV 89.7 02/23/2017   PLT 178 02/23/2017      Chemistry      Component Value Date/Time   NA 135  02/23/2017 1012   NA 139 07/19/2014 1910   K 4.3 02/23/2017 1012   K 4.0 07/19/2014 1910   CL 104 02/23/2017 1012   CL 108 (H) 07/19/2014 1910   CO2 23 02/23/2017 1012   CO2 20 (L) 07/19/2014 1910   BUN 49 (H) 02/23/2017 1012   BUN 36 (H) 07/19/2014 1910   CREATININE 2.15 (H) 02/23/2017 1012   CREATININE  1.73 (H) 07/19/2014 1910      Component Value Date/Time   CALCIUM 9.3 02/23/2017 1012   CALCIUM 9.2 07/19/2014 1910   ALKPHOS 73 02/23/2017 1012   ALKPHOS 74 07/19/2014 1910   AST 19 02/23/2017 1012   AST 16 07/19/2014 1910   ALT 19 02/23/2017 1012   ALT 18 07/19/2014 1910   BILITOT 0.8 02/23/2017 1012   BILITOT 0.5 07/19/2014 1910         ASSESSMENT & PLAN:   CLL (chronic lymphocytic leukemia) ([place]) # CLL- Rai stage 0 currently on surveillance. White count 21 hemoglobin 12 platelets 178. Patient currently asymptomatic. Continue surveillance  # Non-healing ulcer of Left LE- ? Skin cancer- needs evaluation with PCP/dermatology. Discussed with the caregiver.  # CKD- creat 2.15- slightly worse from previous of 1.8 approximately year ago.  Patient had followed up with Dr. Holley Raring in the past. Patient on diuretics for her CHF; will not make a changes at this time.  # Follow-up in one year with labs.  CC: Dr.Bronstein      Cammie Sickle, MD 03/03/2017 3:44 PM

## 2017-02-23 NOTE — Assessment & Plan Note (Addendum)
#   CLL- Rai stage 0 currently on surveillance. White count 21 hemoglobin 12 platelets 178. Patient currently asymptomatic. Continue surveillance  # Non-healing ulcer of Left LE- ? Skin cancer- needs evaluation with PCP/dermatology. Discussed with the caregiver.  # CKD- creat 2.15- slightly worse from previous of 1.8 approximately year ago.  Patient had followed up with Dr. Holley Raring in the past. Patient on diuretics for her CHF; will not make a changes at this time.  # Follow-up in one year with labs.  CC: Dr.Bronstein

## 2017-03-02 LAB — FISH HES LEUKEMIA, 4Q12 REA

## 2017-03-07 ENCOUNTER — Ambulatory Visit (INDEPENDENT_AMBULATORY_CARE_PROVIDER_SITE_OTHER): Payer: Medicare Other | Admitting: Podiatry

## 2017-03-07 DIAGNOSIS — M79676 Pain in unspecified toe(s): Secondary | ICD-10-CM | POA: Diagnosis not present

## 2017-03-07 DIAGNOSIS — B351 Tinea unguium: Secondary | ICD-10-CM | POA: Diagnosis not present

## 2017-03-07 NOTE — Progress Notes (Signed)
Complaint:  Visit Type: Patient returns to my office for continued preventative foot care services. Complaint: Patient states" my nails have grown long and thick and become painful to walk and wear shoes" . The patient presents for preventative foot care services. No changes to ROS  Podiatric Exam: Vascular: dorsalis pedis and posterior tibial pulses are palpable bilateral. Capillary return is immediate. Temperature gradient is WNL. Skin turgor WNL .  Venous stasis and swelling both legs. Sensorium: Normal Semmes Weinstein monofilament test. Normal tactile sensation bilaterally. Nail Exam: Pt has thick disfigured discolored nails with subungual debris noted bilateral entire nail hallux through fifth toenails Ulcer Exam: There is no evidence of ulcer or pre-ulcerative changes or infection. Orthopedic Exam: Muscle tone and strength are WNL. No limitations in general ROM. No crepitus or effusions noted. Foot type and digits show no abnormalities. Bony prominences are unremarkable. Skin: No Porokeratosis. No infection or ulcers  Diagnosis:  Onychomycosis, , Pain in right toe, pain in left toes  Treatment & Plan Procedures and Treatment: Consent by patient was obtained for treatment procedures. The patient understood the discussion of treatment and procedures well. All questions were answered thoroughly reviewed. Debridement of mycotic and hypertrophic toenails, 1 through 5 bilateral and clearing of subungual debris. No ulceration, no infection noted.  Return Visit-Office Procedure: Patient instructed to return to the office for a follow up visit 3 months for continued evaluation and treatment.    Gardiner Barefoot DPM

## 2017-03-08 LAB — MISC LABCORP TEST (SEND OUT): LabCorp test name: 113753

## 2017-06-06 ENCOUNTER — Ambulatory Visit (INDEPENDENT_AMBULATORY_CARE_PROVIDER_SITE_OTHER): Payer: Medicare Other | Admitting: Podiatry

## 2017-06-06 DIAGNOSIS — B351 Tinea unguium: Secondary | ICD-10-CM | POA: Diagnosis not present

## 2017-06-06 DIAGNOSIS — M79676 Pain in unspecified toe(s): Secondary | ICD-10-CM | POA: Diagnosis not present

## 2017-06-06 DIAGNOSIS — E119 Type 2 diabetes mellitus without complications: Secondary | ICD-10-CM

## 2017-06-06 NOTE — Progress Notes (Signed)
Complaint:  Visit Type: Patient returns to my office for continued preventative foot care services. Complaint: Patient states" my nails have grown long and thick and become painful to walk and wear shoes" . The patient presents for preventative foot care services. No changes to ROS  Podiatric Exam: Vascular: dorsalis pedis and posterior tibial pulses are palpable bilateral. Capillary return is immediate. Temperature gradient is WNL. Skin turgor WNL .  Venous stasis and swelling both legs. Sensorium: Normal Semmes Weinstein monofilament test. Normal tactile sensation bilaterally. Nail Exam: Pt has thick disfigured discolored nails with subungual debris noted bilateral entire nail hallux through fifth toenails Ulcer Exam: There is no evidence of ulcer or pre-ulcerative changes or infection. Orthopedic Exam: Muscle tone and strength are WNL. No limitations in general ROM. No crepitus or effusions noted. Foot type and digits show no abnormalities. Bony prominences are unremarkable. Swollen legs  B/L Skin: No Porokeratosis. No infection or ulcers  Diagnosis:  Onychomycosis, , Pain in right toe, pain in left toes  Treatment & Plan Procedures and Treatment: Consent by patient was obtained for treatment procedures. The patient understood the discussion of treatment and procedures well. All questions were answered thoroughly reviewed. Debridement of mycotic and hypertrophic toenails, 1 through 5 bilateral and clearing of subungual debris. No ulceration, no infection noted.  Return Visit-Office Procedure: Patient instructed to return to the office for a follow up visit 3 months for continued evaluation and treatment.    Dhana Totton DPM 

## 2017-06-20 ENCOUNTER — Ambulatory Visit: Payer: Medicare Other | Attending: Family | Admitting: Family

## 2017-06-20 ENCOUNTER — Encounter: Payer: Self-pay | Admitting: Family

## 2017-06-20 VITALS — BP 143/53 | HR 88 | Resp 20 | Ht 62.0 in | Wt 179.0 lb

## 2017-06-20 DIAGNOSIS — N189 Chronic kidney disease, unspecified: Secondary | ICD-10-CM | POA: Insufficient documentation

## 2017-06-20 DIAGNOSIS — H409 Unspecified glaucoma: Secondary | ICD-10-CM | POA: Diagnosis not present

## 2017-06-20 DIAGNOSIS — J449 Chronic obstructive pulmonary disease, unspecified: Secondary | ICD-10-CM | POA: Diagnosis not present

## 2017-06-20 DIAGNOSIS — E1122 Type 2 diabetes mellitus with diabetic chronic kidney disease: Secondary | ICD-10-CM | POA: Diagnosis not present

## 2017-06-20 DIAGNOSIS — Z880 Allergy status to penicillin: Secondary | ICD-10-CM | POA: Insufficient documentation

## 2017-06-20 DIAGNOSIS — I890 Lymphedema, not elsewhere classified: Secondary | ICD-10-CM | POA: Insufficient documentation

## 2017-06-20 DIAGNOSIS — Z885 Allergy status to narcotic agent status: Secondary | ICD-10-CM | POA: Diagnosis not present

## 2017-06-20 DIAGNOSIS — Z87891 Personal history of nicotine dependence: Secondary | ICD-10-CM | POA: Insufficient documentation

## 2017-06-20 DIAGNOSIS — Z888 Allergy status to other drugs, medicaments and biological substances status: Secondary | ICD-10-CM | POA: Insufficient documentation

## 2017-06-20 DIAGNOSIS — Z8249 Family history of ischemic heart disease and other diseases of the circulatory system: Secondary | ICD-10-CM | POA: Insufficient documentation

## 2017-06-20 DIAGNOSIS — Z9889 Other specified postprocedural states: Secondary | ICD-10-CM | POA: Insufficient documentation

## 2017-06-20 DIAGNOSIS — Z833 Family history of diabetes mellitus: Secondary | ICD-10-CM | POA: Insufficient documentation

## 2017-06-20 DIAGNOSIS — I252 Old myocardial infarction: Secondary | ICD-10-CM | POA: Insufficient documentation

## 2017-06-20 DIAGNOSIS — Z9049 Acquired absence of other specified parts of digestive tract: Secondary | ICD-10-CM | POA: Insufficient documentation

## 2017-06-20 DIAGNOSIS — I130 Hypertensive heart and chronic kidney disease with heart failure and stage 1 through stage 4 chronic kidney disease, or unspecified chronic kidney disease: Secondary | ICD-10-CM | POA: Insufficient documentation

## 2017-06-20 DIAGNOSIS — Z856 Personal history of leukemia: Secondary | ICD-10-CM | POA: Diagnosis not present

## 2017-06-20 DIAGNOSIS — E785 Hyperlipidemia, unspecified: Secondary | ICD-10-CM | POA: Insufficient documentation

## 2017-06-20 DIAGNOSIS — Z7982 Long term (current) use of aspirin: Secondary | ICD-10-CM | POA: Insufficient documentation

## 2017-06-20 DIAGNOSIS — I10 Essential (primary) hypertension: Secondary | ICD-10-CM

## 2017-06-20 DIAGNOSIS — Z7984 Long term (current) use of oral hypoglycemic drugs: Secondary | ICD-10-CM | POA: Insufficient documentation

## 2017-06-20 DIAGNOSIS — Z90710 Acquired absence of both cervix and uterus: Secondary | ICD-10-CM | POA: Diagnosis not present

## 2017-06-20 DIAGNOSIS — I5032 Chronic diastolic (congestive) heart failure: Secondary | ICD-10-CM | POA: Diagnosis present

## 2017-06-20 DIAGNOSIS — E119 Type 2 diabetes mellitus without complications: Secondary | ICD-10-CM

## 2017-06-20 NOTE — Patient Instructions (Signed)
Continue weighing daily and call for an overnight weight gain of > 2 pounds or a weekly weight gain of >5 pounds. 

## 2017-06-20 NOTE — Progress Notes (Signed)
Patient ID: Chelsea [name], female    DOB: [birth year], 83 y.o.   MRN: [id number]  HPI  Chelsea [name] is a 83 y/o female with a history of CKD, CLL, COPD, DM, hyperlipidemia, HTN, lymphedema, NSTEMI, remote tobacco use and chronic heart failure.  Reviewed echo report from 05/12/16 which showed an EF of 55-60% along with mild MR and moderate TR.   Has not been admitted or been seen in the ED in the last 6 months.   She presents today for a follow-up visit with a chief complaint of moderate shortness of breath with minimal exertion. She describes this as chronic in nature having been present for several years with varying levels of severity. She has associated fatigue and lower extremity edema along with this.   Past Medical History:  Diagnosis Date  . CHF (congestive heart failure) ([place])   . Chronic kidney disease   . CLL (chronic lymphocytic leukemia) ([place])   . COPD (chronic obstructive pulmonary disease) ([place])   . Diabetes mellitus ([place])   . Glaucoma   . Hyperlipidemia   . Hypertension   . Lymphedema   . Lymphocytic leukemia ([place])   . NSTEMI    Past Surgical History:  Procedure Laterality Date  . ABDOMINAL HYSTERECTOMY    . APPENDECTOMY    . CHOLECYSTECTOMY    . ESOPHAGOGASTRODUODENOSCOPY N/A 03/30/2013   Procedure: ESOPHAGOGASTRODUODENOSCOPY (EGD);  Surgeon: Lafayette Dragon, MD;  Location: [facility] ENDOSCOPY;  Service: Endoscopy;  Laterality: N/A;   Family History  Problem Relation Age of Onset  . Heart attack Mother   . Diabetes Mother   . Hypertension Father    Social History  Substance Use Topics  . Smoking status: Former Smoker    Years: 30.00    Quit date: 11/08/1970  . Smokeless tobacco: Never Used  . Alcohol use No   Allergies  Allergen Reactions  . Oxycodone-Acetaminophen Other (See Comments)    SEVERE HYPOTENSION  . Allopurinol   . Darvocet [Propoxyphene N-Acetaminophen]   . Gold-Containing Drug Products Other (See Comments)  . Penicillins   . Prednisone    Prior to  Admission medications   Medication Sig Start Date End Date Taking? Authorizing Provider  ADVAIR DISKUS 250-50 MCG/DOSE AEPB Inhale 1 puff into the lungs 2 (two) times daily. 04/08/16  Yes [provider]  aspirin EC 81 MG tablet Take 81 mg by mouth daily.   Yes [provider]  atorvastatin (LIPITOR) 20 MG tablet Take 20 mg by mouth daily.   Yes [provider]  furosemide (LASIX) 20 MG tablet Take 40 mg by mouth 2 (two) times daily.    Yes [provider]  glimepiride (AMARYL) 2 MG tablet Take 1 tablet by mouth daily. 02/22/17  Yes [provider]  JANUVIA 25 MG tablet Take 25 mg by mouth daily. 04/20/16  Yes [provider]  KLOR-CON M20 20 MEQ tablet Take 20 mEq by mouth daily. 05/04/16  Yes [provider]  losartan (COZAAR) 50 MG tablet Take 1 tablet by mouth daily. 02/08/17  Yes [provider]  metoprolol tartrate (LOPRESSOR) 25 MG tablet Take 1 tablet (25 mg total) by mouth 2 (two) times daily. 05/14/16  Yes Demetrios Loll, MD  OXYGEN Inhale 2 L/min into the lungs continuous.   Yes [provider]  PROAIR HFA 108 (90 Base) MCG/ACT inhaler Inhale 2 puffs into the lungs every 6 (six) hours as needed. For shortness of breath/wheezing. 04/08/16  Yes [provider]  sucralfate (CARAFATE) 1 G tablet Take 1 g by mouth 4 (four) times daily.   Yes [provider]  timolol (TIMOPTIC) 0.5 % ophthalmic solution Place 1 drop into both eyes 2 (two) times daily. 03/19/16  Yes [provider]   Review of Systems  Constitutional: Positive for fatigue. Negative for appetite change.  HENT: Negative for congestion, postnasal drip and sore throat.   Eyes: Negative.   Respiratory: Positive for shortness of breath. Negative for chest tightness.   Cardiovascular: Positive for leg swelling (lymphedema). Negative for chest pain and palpitations.  Gastrointestinal: Negative for abdominal distention and abdominal pain.   Endocrine: Negative.   Genitourinary: Negative.   Musculoskeletal: Negative for back pain and neck pain.  Skin: Negative.   Allergic/Immunologic: Negative.   Neurological: Negative for dizziness and light-headedness.  Hematological: Negative for adenopathy. Does not bruise/bleed easily.  Psychiatric/Behavioral: Positive for sleep disturbance (sleeping in recliner due to comfort and frequency of waking up). Negative for dysphoric mood. The patient is not nervous/anxious.    Physical Exam  Constitutional: She is oriented to person, place, and time. She appears well-developed and well-nourished.  HENT:  Head: Normocephalic and atraumatic.  Neck: Normal range of motion. Neck supple. No JVD present.  Cardiovascular: Normal rate and regular rhythm.   Pulmonary/Chest: Effort normal. She has no wheezes. She has no rales.  Abdominal: Soft. She exhibits no distension. There is no tenderness.  Musculoskeletal: She exhibits edema (2-3+ pitting edema in bilateral lower legs with L>R). She exhibits no tenderness.  Neurological: She is alert and oriented to person, place, and time.  Skin: Skin is warm and dry.  Psychiatric: She has a normal mood and affect. Her behavior is normal. Thought content normal.  Nursing note and vitals reviewed.  Vitals:   06/20/17 1420  BP: (!) 143/53  Pulse: 88  Resp: 20  SpO2: 93%  Weight: 179 lb (81.2 kg)  Height: 5\' 2"  (1.575 m)   Wt Readings from Last 3 Encounters:  06/20/17 179 lb (81.2 kg)  06/28/16 174 lb (78.9 kg)  06/07/16 179 lb (81.2 kg)    Lab Results  Component Value Date   CREATININE 2.15 (H) 02/23/2017   CREATININE 1.63 (H) 05/15/2016   CREATININE 1.84 (H) 05/14/2016   Assessment & Plan:  1: Chronic heart failure with preserved ejection fraction- - NYHA class III - euvolemic today - weighing daily and says that her weight has been stable. Reminded to call for an overnight weight gain of >2 pounds or a weekly weight gain of >5 pounds -  not adding salt to her food and when she cooks, she doesn't use salt either - saw cardiologist ([place]) 01/19/17 and returns September 2018 - chronic lymphedema in both lower legs; tries to elevate as much as possible - does walk with her walker  2: HTN: - BP looks good today - BMP on 03/03/17 reviewed and shows sodium 140, potassium 4.7 and GFR 28 - saw PCP (Bronstein) 02/28/17 and returns October 2018  3: Diabetes- - A1c on 02/11/17 was 5.8% - doesn't check it consistently  4: COPD- - has seen a pulmonologist in the past but doesn't remember who - wears oxygen @ 2L around the clock - tries to space activities out so that she can conserve energy  Patient did not bring her medications nor a list. Each medication was verbally reviewed with the patient and she was encouraged to bring the bottles to every visit to confirm accuracy of list.  Return in  1 year or sooner for any questions/problems before then.

## 2017-09-05 ENCOUNTER — Ambulatory Visit (INDEPENDENT_AMBULATORY_CARE_PROVIDER_SITE_OTHER): Payer: Medicare Other | Admitting: Podiatry

## 2017-09-05 DIAGNOSIS — E1142 Type 2 diabetes mellitus with diabetic polyneuropathy: Secondary | ICD-10-CM

## 2017-09-05 DIAGNOSIS — B351 Tinea unguium: Secondary | ICD-10-CM | POA: Diagnosis not present

## 2017-09-05 DIAGNOSIS — M79676 Pain in unspecified toe(s): Secondary | ICD-10-CM

## 2017-09-05 NOTE — Progress Notes (Signed)
Complaint:  Visit Type: Patient returns to my office for continued preventative foot care services. Complaint: Patient states" my nails have grown long and thick and become painful to walk and wear shoes" . The patient presents for preventative foot care services. No changes to ROS  Podiatric Exam: Vascular: dorsalis pedis and posterior tibial pulses are palpable bilateral. Capillary return is immediate. Temperature gradient is WNL. Skin turgor WNL .  Venous stasis and swelling both legs. Sensorium: Normal Semmes Weinstein monofilament test. Normal tactile sensation bilaterally. Nail Exam: Pt has thick disfigured discolored nails with subungual debris noted bilateral entire nail hallux through fifth toenails Ulcer Exam: There is no evidence of ulcer or pre-ulcerative changes or infection. Orthopedic Exam: Muscle tone and strength are WNL. No limitations in general ROM. No crepitus or effusions noted. Foot type and digits show no abnormalities. Bony prominences are unremarkable. Swollen legs  B/L Skin: No Porokeratosis. No infection or ulcers  Diagnosis:  Onychomycosis, , Pain in right toe, pain in left toes  Treatment & Plan Procedures and Treatment: Consent by patient was obtained for treatment procedures. The patient understood the discussion of treatment and procedures well. All questions were answered thoroughly reviewed. Debridement of mycotic and hypertrophic toenails, 1 through 5 bilateral and clearing of subungual debris. No ulceration, no infection noted.  Return Visit-Office Procedure: Patient instructed to return to the office for a follow up visit 3 months for continued evaluation and treatment.    Gardiner Barefoot DPM

## 2017-12-05 ENCOUNTER — Ambulatory Visit: Payer: Medicare Other | Admitting: Podiatry

## 2017-12-15 ENCOUNTER — Ambulatory Visit (INDEPENDENT_AMBULATORY_CARE_PROVIDER_SITE_OTHER): Payer: Medicare Other | Admitting: Podiatry

## 2017-12-15 ENCOUNTER — Encounter: Payer: Self-pay | Admitting: Podiatry

## 2017-12-15 DIAGNOSIS — B351 Tinea unguium: Secondary | ICD-10-CM

## 2017-12-15 DIAGNOSIS — M79676 Pain in unspecified toe(s): Secondary | ICD-10-CM | POA: Diagnosis not present

## 2017-12-15 DIAGNOSIS — E1142 Type 2 diabetes mellitus with diabetic polyneuropathy: Secondary | ICD-10-CM

## 2017-12-15 NOTE — Progress Notes (Signed)
Complaint:  Visit Type: Patient returns to my office for continued preventative foot care services. Complaint: Patient states" my nails have grown long and thick and become painful to walk and wear shoes" . The patient presents for preventative foot care services. No changes to ROS  Podiatric Exam: Vascular: dorsalis pedis and posterior tibial pulses are palpable bilateral. Capillary return is immediate. Temperature gradient is WNL. Skin turgor WNL .  Venous stasis and swelling both legs. Sensorium: Normal Semmes Weinstein monofilament test. Normal tactile sensation bilaterally. Nail Exam: Pt has thick disfigured discolored nails with subungual debris noted bilateral entire nail hallux through fifth toenails Ulcer Exam: There is no evidence of ulcer or pre-ulcerative changes or infection. Orthopedic Exam: Muscle tone and strength are WNL. No limitations in general ROM. No crepitus or effusions noted. Foot type and digits show no abnormalities. Bony prominences are unremarkable. Swollen legs  B/L Skin: No Porokeratosis. No infection or ulcers  Diagnosis:  Onychomycosis, , Pain in right toe, pain in left toes  Treatment & Plan Procedures and Treatment: Consent by patient was obtained for treatment procedures. The patient understood the discussion of treatment and procedures well. All questions were answered thoroughly reviewed. Debridement of mycotic and hypertrophic toenails, 1 through 5 bilateral and clearing of subungual debris. No ulceration, no infection noted.  Return Visit-Office Procedure: Patient instructed to return to the office for a follow up visit 3 months for continued evaluation and treatment.    Gardiner Barefoot DPM

## 2018-02-23 ENCOUNTER — Other Ambulatory Visit: Payer: Self-pay

## 2018-02-23 ENCOUNTER — Encounter: Payer: Self-pay | Admitting: Internal Medicine

## 2018-02-23 ENCOUNTER — Inpatient Hospital Stay: Payer: Medicare Other

## 2018-02-23 ENCOUNTER — Inpatient Hospital Stay: Payer: Medicare Other | Attending: Internal Medicine | Admitting: Internal Medicine

## 2018-02-23 VITALS — BP 112/70 | HR 66 | Temp 96.9°F | Resp 20 | Ht 62.0 in | Wt 180.0 lb

## 2018-02-23 DIAGNOSIS — Z79899 Other long term (current) drug therapy: Secondary | ICD-10-CM | POA: Insufficient documentation

## 2018-02-23 DIAGNOSIS — J449 Chronic obstructive pulmonary disease, unspecified: Secondary | ICD-10-CM | POA: Diagnosis not present

## 2018-02-23 DIAGNOSIS — C9110 Chronic lymphocytic leukemia of B-cell type not having achieved remission: Secondary | ICD-10-CM

## 2018-02-23 DIAGNOSIS — L98499 Non-pressure chronic ulcer of skin of other sites with unspecified severity: Secondary | ICD-10-CM | POA: Diagnosis not present

## 2018-02-23 DIAGNOSIS — Z9981 Dependence on supplemental oxygen: Secondary | ICD-10-CM | POA: Insufficient documentation

## 2018-02-23 DIAGNOSIS — I509 Heart failure, unspecified: Secondary | ICD-10-CM | POA: Insufficient documentation

## 2018-02-23 DIAGNOSIS — N183 Chronic kidney disease, stage 3 (moderate): Secondary | ICD-10-CM | POA: Diagnosis not present

## 2018-02-23 DIAGNOSIS — R609 Edema, unspecified: Secondary | ICD-10-CM | POA: Insufficient documentation

## 2018-02-23 LAB — CBC WITH DIFFERENTIAL/PLATELET
BASOS ABS: 0.1 10*3/uL (ref 0–0.1)
BASOS PCT: 0 %
EOS PCT: 1 %
Eosinophils Absolute: 0.3 10*3/uL (ref 0–0.7)
HCT: 39.9 % (ref 35.0–47.0)
Hemoglobin: 13 g/dL (ref 12.0–16.0)
Lymphocytes Relative: 69 %
Lymphs Abs: 20.6 10*3/uL — ABNORMAL HIGH (ref 1.0–3.6)
MCH: 29.9 pg (ref 26.0–34.0)
MCHC: 32.5 g/dL (ref 32.0–36.0)
MCV: 91.9 fL (ref 80.0–100.0)
MONO ABS: 0.8 10*3/uL (ref 0.2–0.9)
Monocytes Relative: 3 %
Neutro Abs: 7.9 10*3/uL — ABNORMAL HIGH (ref 1.4–6.5)
Neutrophils Relative %: 27 %
PLATELETS: 174 10*3/uL (ref 150–440)
RBC: 4.34 MIL/uL (ref 3.80–5.20)
RDW: 15.7 % — AB (ref 11.5–14.5)
WBC: 29.6 10*3/uL — ABNORMAL HIGH (ref 3.6–11.0)

## 2018-02-23 LAB — COMPREHENSIVE METABOLIC PANEL
ALBUMIN: 4 g/dL (ref 3.5–5.0)
ALK PHOS: 71 U/L (ref 38–126)
ALT: 17 U/L (ref 14–54)
AST: 24 U/L (ref 15–41)
Anion gap: 10 (ref 5–15)
BILIRUBIN TOTAL: 1.2 mg/dL (ref 0.3–1.2)
BUN: 54 mg/dL — AB (ref 6–20)
CALCIUM: 9.5 mg/dL (ref 8.9–10.3)
CO2: 21 mmol/L — ABNORMAL LOW (ref 22–32)
CREATININE: 1.94 mg/dL — AB (ref 0.44–1.00)
Chloride: 106 mmol/L (ref 101–111)
GFR calc Af Amer: 25 mL/min — ABNORMAL LOW (ref >60)
GFR calc non Af Amer: 21 mL/min — ABNORMAL LOW (ref >60)
GLUCOSE: 217 mg/dL — AB (ref 65–99)
Potassium: 4.7 mmol/L (ref 3.5–5.1)
Sodium: 137 mmol/L (ref 135–145)
TOTAL PROTEIN: 7.2 g/dL (ref 6.5–8.1)

## 2018-02-23 NOTE — Progress Notes (Signed)
[facility] OFFICE PROGRESS NOTE  Patient Care Team: [name], MD as PCP - General (Family Medicine)   SUMMARY OF ONCOLOGIC HISTORY:  Oncology History   # CLL Rai Stage- 0   # CKD [creat 1.7- sep 2015 ]  # COPD on 2 lits chronic.      Chronic lymphocytic leukemia ([place])   02/23/2017 Initial Diagnosis    CLL (chronic lymphocytic leukemia) ([place])        INTERVAL HISTORY:  84 year old Chelsea [name] patient with above history of CLL currently on surveillance is here for follow-up.   Patient has chronic mild shortness of breath especially with exertion.  She is on home O2 24 hours 2 L-CHF/COPD.  She is not getting any increasing oxygen requirement.  Patient denies any weight loss or recent pneumonias or any lumps or bumps or any fevers.  The patient noted to have a nonhealing ulcer of the left shin over the last 1 year. Has not gotten any worse or bleeding. She currently lives in a nursing home. She is accompanied by her caregiver.   REVIEW OF SYSTEMS:  A complete 10 point review of system is done which is negative except mentioned above/history of present illness.   PAST MEDICAL HISTORY :  Past Medical History:  Diagnosis Date  . CHF (congestive heart failure) ([place])   . Chronic kidney disease   . CLL (chronic lymphocytic leukemia) ([place])   . COPD (chronic obstructive pulmonary disease) ([place])   . Diabetes mellitus ([employer])   . Glaucoma   . Hyperlipidemia   . Hypertension   . Lymphedema   . Lymphocytic leukemia ([place])   . NSTEMI     PAST SURGICAL HISTORY :   Past Surgical History:  Procedure Laterality Date  . ABDOMINAL HYSTERECTOMY    . APPENDECTOMY    . CHOLECYSTECTOMY    . ESOPHAGOGASTRODUODENOSCOPY N/A 03/30/2013   Procedure: ESOPHAGOGASTRODUODENOSCOPY (EGD);  Surgeon: Lafayette Dragon, MD;  Location: [facility] ENDOSCOPY;  Service: Endoscopy;  Laterality: N/A;    FAMILY HISTORY :   Family History  Problem Relation Age of Onset  . Heart attack Mother   .  Diabetes Mother   . Hypertension Father     SOCIAL HISTORY:   Social History   Tobacco Use  . Smoking status: Former Smoker    Years: 30.00    Last attempt to quit: 11/08/1970    Years since quitting: 47.3  . Smokeless tobacco: Never Used  Substance Use Topics  . Alcohol use: No  . Drug use: No    ALLERGIES:  is allergic to oxycodone-acetaminophen; allopurinol; aurothioglucose; darvocet [propoxyphene n-acetaminophen]; gold-containing drug products; penicillins; and prednisone.  MEDICATIONS:  Current Outpatient Medications  Medication Sig Dispense Refill  . ADVAIR DISKUS 250-50 MCG/DOSE AEPB Inhale 1 puff into the lungs 2 (two) times daily.    Marland Kitchen aspirin EC 81 MG tablet Take 81 mg by mouth daily.    Marland Kitchen atorvastatin (LIPITOR) 20 MG tablet Take 20 mg by mouth daily.    . Cholecalciferol (VITAMIN D-1000 MAX ST) 1000 units tablet Take by mouth.    . furosemide (LASIX) 20 MG tablet Take 40 mg by mouth 2 (two) times daily.     Marland Kitchen glimepiride (AMARYL) 2 MG tablet Take 1 tablet by mouth daily.    Marland Kitchen JANUVIA 25 MG tablet Take 25 mg by mouth daily.    Marland Kitchen levothyroxine (SYNTHROID, LEVOTHROID) 50 MCG tablet Take 50 mcg by mouth daily before breakfast.     . losartan (  COZAAR) 50 MG tablet Take 1 tablet by mouth daily.    . metoprolol tartrate (LOPRESSOR) 25 MG tablet Take 1 tablet (25 mg total) by mouth 2 (two) times daily.    . OXYGEN Inhale 2 L/min into the lungs continuous.    Marland Kitchen PROAIR HFA 108 (90 Base) MCG/ACT inhaler Inhale 2 puffs into the lungs every 6 (six) hours as needed. For shortness of breath/wheezing.     No current facility-administered medications for this visit.     PHYSICAL EXAMINATION:   BP 112/Chelsea (BP Location: Left Arm, Patient Position: Sitting)   Pulse 66   Temp (!) 96.9 F (36.1 C) (Tympanic)   Resp 20   Ht 5\' 2"  (1.575 m)   Wt 180 lb (81.6 kg)   BMI 32.92 kg/m   Filed Weights   02/23/18 1516  Weight: 180 lb (81.6 kg)    GENERAL: Well-nourished  well-developed; Alert, Mild respiratory distress/chronic pursed  lip breathing  and comfortable. Alone. Accompanied by caregiver. EYES: no pallor or icterus OROPHARYNX: no thrush or ulceration; good dentition  NECK: supple, no masses felt LYMPH:  no palpable lymphadenopathy in the cervical, axillary or inguinal regions LUNGS:  decreased pretzels bilaterally. No wheeze or crackles HEART/CVS: regular rate & rhythm and no murmurs; No lower extremity edema ABDOMEN:abdomen soft, non-tender and normal bowel sounds Musculoskeletal:no cyanosis of digits and no clubbing  PSYCH: alert & oriented x 3 with fluent speech NEURO: no focal motor/sensory deficits SKIN:  no rashes or significant lesions; one to 2 cm ulcerative lesion noted in the left shin. No active bleeding.  LABORATORY DATA:  I have reviewed the data as listed    Component Value Date/Time   NA 137 02/23/2018 1446   NA 139 07/19/2014 1910   K 4.7 02/23/2018 1446   K 4.0 07/19/2014 1910   CL 106 02/23/2018 1446   CL 108 (H) 07/19/2014 1910   CO2 21 (L) 02/23/2018 1446   CO2 20 (L) 07/19/2014 1910   GLUCOSE 217 (H) 02/23/2018 1446   GLUCOSE 141 (H) 07/19/2014 1910   BUN 54 (H) 02/23/2018 1446   BUN 36 (H) 07/19/2014 1910   CREATININE 1.94 (H) 02/23/2018 1446   CREATININE 1.73 (H) 07/19/2014 1910   CALCIUM 9.5 02/23/2018 1446   CALCIUM 9.2 07/19/2014 1910   PROT 7.2 02/23/2018 1446   PROT 6.9 07/19/2014 1910   ALBUMIN 4.0 02/23/2018 1446   ALBUMIN 3.7 07/19/2014 1910   AST 24 02/23/2018 1446   AST 16 07/19/2014 1910   ALT 17 02/23/2018 1446   ALT 18 07/19/2014 1910   ALKPHOS 71 02/23/2018 1446   ALKPHOS 74 07/19/2014 1910   BILITOT 1.2 02/23/2018 1446   BILITOT 0.5 07/19/2014 1910   GFRNONAA 21 (L) 02/23/2018 1446   GFRNONAA 26 (L) 07/19/2014 1910   GFRAA 25 (L) 02/23/2018 1446   GFRAA 30 (L) 07/19/2014 1910    No results found for: SPEP, UPEP  Lab Results  Component Value Date   WBC 29.6 (H) 02/23/2018    NEUTROABS 7.9 (H) 02/23/2018   HGB 13.0 02/23/2018   HCT 39.9 02/23/2018   MCV 91.9 02/23/2018   PLT 174 02/23/2018      Chemistry      Component Value Date/Time   NA 137 02/23/2018 1446   NA 139 07/19/2014 1910   K 4.7 02/23/2018 1446   K 4.0 07/19/2014 1910   CL 106 02/23/2018 1446   CL 108 (H) 07/19/2014 1910   CO2 21 (L) 02/23/2018  1446   CO2 20 (L) 07/19/2014 1910   BUN 54 (H) 02/23/2018 1446   BUN 36 (H) 07/19/2014 1910   CREATININE 1.94 (H) 02/23/2018 1446   CREATININE 1.73 (H) 07/19/2014 1910      Component Value Date/Time   CALCIUM 9.5 02/23/2018 1446   CALCIUM 9.2 07/19/2014 1910   ALKPHOS 71 02/23/2018 1446   ALKPHOS 74 07/19/2014 1910   AST 24 02/23/2018 1446   AST 16 07/19/2014 1910   ALT 17 02/23/2018 1446   ALT 18 07/19/2014 1910   BILITOT 1.2 02/23/2018 1446   BILITOT 0.5 07/19/2014 1910         ASSESSMENT & PLAN:   Chronic lymphocytic leukemia ([place]) # CLL- Rai stage 0 currently on surveillance. White count 29 hemoglobin 13 platelets -normal.. Patient currently asymptomatic. Continue surveillance  # CKD-III/STABLE.  Creatinine 1.8.  [followed up with Dr. Holley Raring in the past]. Patient on diuretics for her CHF; will not make a changes at this time.  #Chronic respiratory failure-COPD/CHF on home O2.  Stable  #Chronic bilateral leg swelling/dermatitis stable.  # Follow-up in one year with labs.  CC: Dr.Bronstein      Cammie Sickle, MD 02/23/2018 4:00 PM

## 2018-02-23 NOTE — Assessment & Plan Note (Addendum)
#   CLL- Rai stage 0 currently on surveillance. White count 29 hemoglobin 13 platelets -normal.. Patient currently asymptomatic. Continue surveillance  # CKD-III/STABLE.  Creatinine 1.8.  [followed up with Dr. Holley Raring in the past]. Patient on diuretics for her CHF; will not make a changes at this time.  #Chronic respiratory failure-COPD/CHF on home O2.  Stable  #Chronic bilateral leg swelling/dermatitis stable.  # Follow-up in one year with labs.  CC: Dr.Bronstein

## 2018-02-23 NOTE — Progress Notes (Signed)
Patient here to f/u for h/o CLL. She has no medical complaints today.

## 2018-02-24 ENCOUNTER — Ambulatory Visit: Payer: Medicare Other | Admitting: Internal Medicine

## 2018-02-24 ENCOUNTER — Other Ambulatory Visit: Payer: Medicare Other

## 2018-03-16 ENCOUNTER — Ambulatory Visit: Payer: Medicare Other | Admitting: Podiatry

## 2018-04-10 ENCOUNTER — Inpatient Hospital Stay
Admission: EM | Admit: 2018-04-10 | Discharge: 2018-04-13 | DRG: 291 | Disposition: A | Discharge status: Skilled Nursing Facility | Payer: Medicare Other | Attending: Internal Medicine | Admitting: Internal Medicine

## 2018-04-10 ENCOUNTER — Encounter: Payer: Self-pay | Admitting: Emergency Medicine

## 2018-04-10 ENCOUNTER — Emergency Department: Payer: Medicare Other

## 2018-04-10 ENCOUNTER — Other Ambulatory Visit: Payer: Self-pay

## 2018-04-10 DIAGNOSIS — I2510 Atherosclerotic heart disease of native coronary artery without angina pectoris: Secondary | ICD-10-CM | POA: Diagnosis present

## 2018-04-10 DIAGNOSIS — I252 Old myocardial infarction: Secondary | ICD-10-CM

## 2018-04-10 DIAGNOSIS — I872 Venous insufficiency (chronic) (peripheral): Secondary | ICD-10-CM | POA: Diagnosis present

## 2018-04-10 DIAGNOSIS — Z87891 Personal history of nicotine dependence: Secondary | ICD-10-CM | POA: Diagnosis not present

## 2018-04-10 DIAGNOSIS — C9110 Chronic lymphocytic leukemia of B-cell type not having achieved remission: Secondary | ICD-10-CM | POA: Diagnosis present

## 2018-04-10 DIAGNOSIS — H409 Unspecified glaucoma: Secondary | ICD-10-CM | POA: Diagnosis present

## 2018-04-10 DIAGNOSIS — I10 Essential (primary) hypertension: Secondary | ICD-10-CM | POA: Diagnosis present

## 2018-04-10 DIAGNOSIS — I340 Nonrheumatic mitral (valve) insufficiency: Secondary | ICD-10-CM | POA: Diagnosis present

## 2018-04-10 DIAGNOSIS — I272 Pulmonary hypertension, unspecified: Secondary | ICD-10-CM | POA: Diagnosis present

## 2018-04-10 DIAGNOSIS — C9111 Chronic lymphocytic leukemia of B-cell type in remission: Secondary | ICD-10-CM | POA: Diagnosis present

## 2018-04-10 DIAGNOSIS — E1122 Type 2 diabetes mellitus with diabetic chronic kidney disease: Secondary | ICD-10-CM | POA: Diagnosis present

## 2018-04-10 DIAGNOSIS — Z79899 Other long term (current) drug therapy: Secondary | ICD-10-CM | POA: Diagnosis not present

## 2018-04-10 DIAGNOSIS — E118 Type 2 diabetes mellitus with unspecified complications: Secondary | ICD-10-CM | POA: Diagnosis present

## 2018-04-10 DIAGNOSIS — E785 Hyperlipidemia, unspecified: Secondary | ICD-10-CM | POA: Diagnosis present

## 2018-04-10 DIAGNOSIS — Z66 Do not resuscitate: Secondary | ICD-10-CM | POA: Diagnosis present

## 2018-04-10 DIAGNOSIS — Z7984 Long term (current) use of oral hypoglycemic drugs: Secondary | ICD-10-CM | POA: Diagnosis not present

## 2018-04-10 DIAGNOSIS — I5023 Acute on chronic systolic (congestive) heart failure: Secondary | ICD-10-CM | POA: Diagnosis present

## 2018-04-10 DIAGNOSIS — Z9981 Dependence on supplemental oxygen: Secondary | ICD-10-CM | POA: Diagnosis not present

## 2018-04-10 DIAGNOSIS — J449 Chronic obstructive pulmonary disease, unspecified: Secondary | ICD-10-CM | POA: Diagnosis present

## 2018-04-10 DIAGNOSIS — I130 Hypertensive heart and chronic kidney disease with heart failure and stage 1 through stage 4 chronic kidney disease, or unspecified chronic kidney disease: Principal | ICD-10-CM | POA: Diagnosis present

## 2018-04-10 DIAGNOSIS — Z7982 Long term (current) use of aspirin: Secondary | ICD-10-CM | POA: Diagnosis not present

## 2018-04-10 DIAGNOSIS — I5033 Acute on chronic diastolic (congestive) heart failure: Secondary | ICD-10-CM | POA: Diagnosis present

## 2018-04-10 DIAGNOSIS — N184 Chronic kidney disease, stage 4 (severe): Secondary | ICD-10-CM | POA: Diagnosis present

## 2018-04-10 DIAGNOSIS — R0602 Shortness of breath: Secondary | ICD-10-CM

## 2018-04-10 DIAGNOSIS — I509 Heart failure, unspecified: Secondary | ICD-10-CM

## 2018-04-10 LAB — CBC WITH DIFFERENTIAL/PLATELET
BASOS PCT: 0 %
Basophils Absolute: 0 10*3/uL (ref 0–0.1)
Eosinophils Absolute: 0.8 10*3/uL — ABNORMAL HIGH (ref 0–0.7)
Eosinophils Relative: 3 %
HCT: 38.4 % (ref 35.0–47.0)
Hemoglobin: 12.5 g/dL (ref 12.0–16.0)
Lymphocytes Relative: 60 %
Lymphs Abs: 15.9 10*3/uL — ABNORMAL HIGH (ref 1.0–3.6)
MCH: 29.3 pg (ref 26.0–34.0)
MCHC: 32.4 g/dL (ref 32.0–36.0)
MCV: 90.2 fL (ref 80.0–100.0)
MONO ABS: 1.3 10*3/uL — AB (ref 0.2–0.9)
MONOS PCT: 5 %
NEUTROS PCT: 32 %
Neutro Abs: 8.4 10*3/uL — ABNORMAL HIGH (ref 1.4–6.5)
Platelets: 215 10*3/uL (ref 150–440)
RBC: 4.26 MIL/uL (ref 3.80–5.20)
RDW: 16.1 % — ABNORMAL HIGH (ref 11.5–14.5)
WBC: 26.4 10*3/uL — AB (ref 3.6–11.0)

## 2018-04-10 LAB — BRAIN NATRIURETIC PEPTIDE: B Natriuretic Peptide: 1119 pg/mL — ABNORMAL HIGH (ref 0.0–100.0)

## 2018-04-10 LAB — BASIC METABOLIC PANEL
ANION GAP: 11 (ref 5–15)
BUN: 50 mg/dL — ABNORMAL HIGH (ref 6–20)
CHLORIDE: 104 mmol/L (ref 101–111)
CO2: 24 mmol/L (ref 22–32)
CREATININE: 1.87 mg/dL — AB (ref 0.44–1.00)
Calcium: 9.8 mg/dL (ref 8.9–10.3)
GFR calc non Af Amer: 22 mL/min — ABNORMAL LOW (ref >60)
GFR, EST AFRICAN AMERICAN: 26 mL/min — AB (ref >60)
Glucose, Bld: 133 mg/dL — ABNORMAL HIGH (ref 65–99)
Potassium: 5 mmol/L (ref 3.5–5.1)
SODIUM: 139 mmol/L (ref 135–145)

## 2018-04-10 LAB — TROPONIN I: Troponin I: 0.04 ng/mL (ref <0.03)

## 2018-04-10 MED ORDER — FUROSEMIDE 10 MG/ML IJ SOLN
60.0000 mg | Freq: Once | INTRAMUSCULAR | Status: AC
  Administered 2018-04-10: 60 mg via INTRAVENOUS
  Filled 2018-04-10: qty 8

## 2018-04-10 MED ORDER — IPRATROPIUM-ALBUTEROL 0.5-2.5 (3) MG/3ML IN SOLN
3.0000 mL | Freq: Once | RESPIRATORY_TRACT | Status: AC
  Administered 2018-04-10: 3 mL via RESPIRATORY_TRACT
  Filled 2018-04-10: qty 3

## 2018-04-10 NOTE — ED Triage Notes (Signed)
Patient to ED from [facility] via [facility]. Per EMS, home health states patient had increased shortness of breath today and increased work of breathing. Patient 80% on room air for home. Per facility, patient is supposed to be on 2L [place] at all time. Upon arrival, patient on 4L [place] with oxygen at 96%. Patient tachypnic but speaking in complete sentences. History of CHF. Bilateral leg swelling noted.

## 2018-04-10 NOTE — H&P (Signed)
[facility] NAME: Chelsea [name]    MR#:  [id number]  DATE OF BIRTH:  Mar 20, 1925  DATE OF ADMISSION:  04/10/2018  PRIMARY CARE PHYSICIAN: [name], MD   REQUESTING/REFERRING PHYSICIAN: Burlene Arnt, MD  CHIEF COMPLAINT:   Chief Complaint  Patient presents with  . Shortness of Breath    HISTORY OF PRESENT ILLNESS:  Chelsea [name]  is a 84 y.o. female who presents with shortness of breath and lower extremity edema.  Patient states she typically has lower extremity edema, but is somewhat worse now.  Work-up in the ED does not show any edema or fluid in her lungs though her BNP is significantly elevated.  She was given some IV Lasix in the ED and hospitalist were called for admission for CHF exacerbation  PAST MEDICAL HISTORY:   Past Medical History:  Diagnosis Date  . CHF (congestive heart failure) ([place])   . Chronic kidney disease   . CLL (chronic lymphocytic leukemia) ([place])   . COPD (chronic obstructive pulmonary disease) ([place])   . Diabetes mellitus ([place])   . Glaucoma   . Hyperlipidemia   . Hypertension   . Lymphedema   . Lymphocytic leukemia ([place])   . NSTEMI      PAST SURGICAL HISTORY:   Past Surgical History:  Procedure Laterality Date  . ABDOMINAL HYSTERECTOMY    . APPENDECTOMY    . CHOLECYSTECTOMY    . ESOPHAGOGASTRODUODENOSCOPY N/A 03/30/2013   Procedure: ESOPHAGOGASTRODUODENOSCOPY (EGD);  Surgeon: Lafayette Dragon, MD;  Location: [facility] ENDOSCOPY;  Service: Endoscopy;  Laterality: N/A;     SOCIAL HISTORY:   Social History   Tobacco Use  . Smoking status: Former Smoker    Years: 30.00    Last attempt to quit: 11/08/1970    Years since quitting: 47.4  . Smokeless tobacco: Never Used  Substance Use Topics  . Alcohol use: No     FAMILY HISTORY:   Family History  Problem Relation Age of Onset  . Heart attack Mother   . Diabetes Mother   . Hypertension Father      DRUG ALLERGIES:   Allergies   Allergen Reactions  . Oxycodone-Acetaminophen Other (See Comments)    SEVERE HYPOTENSION  . Allopurinol   . Aurothioglucose Other (See Comments)  . Darvocet [Propoxyphene N-Acetaminophen]   . Gold-Containing Drug Products Other (See Comments)  . Penicillins   . Prednisone     MEDICATIONS AT HOME:   Prior to Admission medications   Medication Sig Start Date End Date Taking? Authorizing Provider  aspirin EC 81 MG tablet Take 81 mg by mouth daily.   Yes [provider]  atorvastatin (LIPITOR) 20 MG tablet Take 20 mg by mouth daily.   Yes [provider]  Cholecalciferol (VITAMIN D-1000 MAX ST) 1000 units tablet Take by mouth.   Yes [provider]  furosemide (LASIX) 20 MG tablet Take 40 mg by mouth daily.    Yes [provider]  glimepiride (AMARYL) 2 MG tablet Take 1 tablet by mouth daily. 02/22/17  Yes [provider]  JANUVIA 25 MG tablet Take 25 mg by mouth daily. 04/20/16  Yes [provider]  levothyroxine (SYNTHROID, LEVOTHROID) 50 MCG tablet Take 50 mcg by mouth daily before breakfast.  08/30/17  Yes [provider]  losartan (COZAAR) 50 MG tablet Take 1 tablet by mouth daily.   Yes [provider]  metoprolol tartrate (LOPRESSOR) 25 MG tablet Take 1 tablet (25  mg total) by mouth 2 (two) times daily. Patient taking differently: Take 25 mg by mouth daily.  05/14/16  Yes Demetrios Loll, MD  OXYGEN Inhale 2 L/min into the lungs continuous.   Yes [provider]  potassium chloride SA (K-DUR,KLOR-CON) 20 MEQ tablet Take 20 mEq by mouth daily.   Yes [provider]  PROAIR HFA 108 (90 Base) MCG/ACT inhaler Inhale 2 puffs into the lungs every 6 (six) hours as needed. For shortness of breath/wheezing. 04/08/16  Yes [provider]  ADVAIR DISKUS 250-50 MCG/DOSE AEPB Inhale 1 puff into the lungs 2 (two) times daily. 04/08/16   [provider]    REVIEW OF SYSTEMS:  Review of Systems   Constitutional: Negative for chills, fever, malaise/fatigue and weight loss.  HENT: Negative for ear pain, hearing loss and tinnitus.   Eyes: Negative for blurred vision, double vision, pain and redness.  Respiratory: Positive for shortness of breath. Negative for cough and hemoptysis.   Cardiovascular: Positive for leg swelling. Negative for chest pain, palpitations and orthopnea.  Gastrointestinal: Negative for abdominal pain, constipation, diarrhea, nausea and vomiting.  Genitourinary: Negative for dysuria, frequency and hematuria.  Musculoskeletal: Negative for back pain, joint pain and neck pain.  Skin:       No acne, rash, or lesions  Neurological: Negative for dizziness, tremors, focal weakness and weakness.  Endo/Heme/Allergies: Negative for polydipsia. Does not bruise/bleed easily.  Psychiatric/Behavioral: Negative for depression. The patient is not nervous/anxious and does not have insomnia.      VITAL SIGNS:   Vitals:   04/10/18 2100 04/10/18 2130 04/10/18 2200 04/10/18 2230  BP: (!) 129/45 (!) 101/41 (!) 106/44 (!) 116/37  Pulse: 80 81 76 78  Resp: 19 16 12 14   Temp:      TempSrc:      SpO2: 96% 95% 99% 97%  Weight:      Height:       Wt Readings from Last 3 Encounters:  04/10/18 81.6 kg (180 lb)  02/23/18 81.6 kg (180 lb)  06/20/17 81.2 kg (179 lb)    PHYSICAL EXAMINATION:  Physical Exam  Vitals reviewed. Constitutional: She is oriented to person, place, and time. She appears well-developed and well-nourished. No distress.  HENT:  Head: Normocephalic and atraumatic.  Mouth/Throat: Oropharynx is clear and moist.  Eyes: Pupils are equal, round, and reactive to light. Conjunctivae and EOM are normal. No scleral icterus.  Neck: Normal range of motion. Neck supple. No JVD present. No thyromegaly present.  Cardiovascular: Normal rate, regular rhythm and intact distal pulses. Exam reveals no gallop and no friction rub.  No murmur heard. Respiratory: Effort normal  and breath sounds normal. No respiratory distress. She has no wheezes. She has no rales.  GI: Soft. Bowel sounds are normal. She exhibits no distension. There is no tenderness.  Musculoskeletal: Normal range of motion. She exhibits edema.  No arthritis, no gout  Lymphadenopathy:    She has no cervical adenopathy.  Neurological: She is alert and oriented to person, place, and time. No cranial nerve deficit.  No dysarthria, no aphasia  Skin: Skin is warm and dry. No rash noted. No erythema.  Psychiatric: She has a normal mood and affect. Her behavior is normal. Judgment and thought content normal.    LABORATORY PANEL:   CBC Recent Labs  Lab 04/10/18 1949  WBC 26.4*  HGB 12.5  HCT 38.4  PLT 215   ------------------------------------------------------------------------------------------------------------------  Chemistries  Recent Labs  Lab 04/10/18 1949  NA 139  K 5.0  CL 104  CO2 24  GLUCOSE 133*  BUN 50*  CREATININE 1.87*  CALCIUM 9.8   ------------------------------------------------------------------------------------------------------------------  Cardiac Enzymes Recent Labs  Lab 04/10/18 1949  TROPONINI 0.04*   ------------------------------------------------------------------------------------------------------------------  RADIOLOGY:  Dg Chest Port 1 View  Result Date: 04/10/2018 CLINICAL DATA:  Increasing shortness of breath EXAM: PORTABLE CHEST 1 VIEW COMPARISON:  05/11/2016 FINDINGS: Heart is normal size. Suspect small right pleural effusion with right base atelectasis. No confluent opacity or effusion on the left. Areas of scarring in the upper lobes. No acute bony abnormality. IMPRESSION: Suspect small right pleural effusion with right base atelectasis. Electronically Signed   By: Rolm Baptise M.D.   On: 04/10/2018 19:59    EKG:   Orders placed or performed during the hospital encounter of 04/10/18  . ED EKG  . ED EKG  . EKG 12-Lead  . EKG 12-Lead     IMPRESSION AND PLAN:  Principal Problem:   Acute on chronic systolic CHF (congestive heart failure) (HCC) -IV Lasix given in the ED, repeat dose later as blood pressure tolerates, cardiology consult Active Problems:   Controlled type 2 diabetes mellitus with complication, without long-term current use of insulin (HCC) -sliding scale insulin with corresponding glucose checks   Chronic obstructive pulmonary disease (HCC) -home dose inhalers   HTN (hypertension) -continue home meds   Chronic lymphocytic leukemia ([place]) -patient has elevated white blood cell count at baseline, monitor   HLD (hyperlipidemia) -Home dose antilipid   CKD (chronic kidney disease), stage IV ([place]) -at baseline, avoid nephrotoxins and monitor  Chart review performed and case discussed with ED provider. Labs, imaging and/or ECG reviewed by provider and discussed with patient/family. Management plans discussed with the patient and/or family.  DVT PROPHYLAXIS: SubQ heparin  GI PROPHYLAXIS: None  ADMISSION STATUS: Inpatient  CODE STATUS: DNR Code Status History    Date Active Date Inactive Code Status Order ID Comments User Context   05/11/2016 1727 05/15/2016 1354 DNR [id number]  Baxter Hire, MD Inpatient   03/01/2013 2204 04/06/2013 1652 Full Code [id number]  [place], Delphine Inpatient    Questions for Most Recent Historical Code Status (Order [id number])    Question Answer Comment   In the event of cardiac or respiratory ARREST Do not call a "code blue"    In the event of cardiac or respiratory ARREST Do not perform Intubation, CPR, defibrillation or ACLS    In the event of cardiac or respiratory ARREST Use medication by any route, position, wound care, and other measures to relive pain and suffering. May use oxygen, suction and manual treatment of airway obstruction as needed for comfort.       TOTAL TIME TAKING CARE OF THIS PATIENT: 45 minutes.   Chelsea [name] 04/10/2018, 11:19 PM  [employer]  [phone]  CC: Primary care physician; [name], MD  Note:  This document was prepared using Dragon voice recognition software and may include unintentional dictation errors.

## 2018-04-10 NOTE — ED Provider Notes (Signed)
[facility] Emergency Department Provider Note  ____________________________________________   I have reviewed the triage vital signs and the nursing notes. Where available I have reviewed prior notes and, if possible and indicated, outside hospital notes.    HISTORY  Chief Complaint Shortness of Breath    HPI Chelsea [name] is a 84 y.o. female  [place] of CHF, CLL in remission, COPD, on Lasix at home on 2 L chronically, presents today with shortness of breath.  Very poor historian.  She is DNR/DNI.  History is of limited by her family and EMS.  Level 5 chart caveat; no further history available due to patient status.  Patient has had increased lower extremity bilateral edema over the last couple days and increasing shortness of breath and orthopnea.     Past Medical History:  Diagnosis Date  . CHF (congestive heart failure) ([place])   . Chronic kidney disease   . CLL (chronic lymphocytic leukemia) ([place])   . COPD (chronic obstructive pulmonary disease) ([place])   . Diabetes mellitus ([place])   . Glaucoma   . Hyperlipidemia   . Hypertension   . Lymphedema   . Lymphocytic leukemia ([place])   . NSTEMI     Patient Active Problem List   Diagnosis Date Noted  . Chronic lymphocytic leukemia ([place]) 02/23/2017  . HTN (hypertension) 06/07/2016  . Hyperlipidemia, unspecified 12/24/2015  . Gastric ulcer 03/30/2013  . C. difficile colitis 03/05/2013  . Non-ST elevation myocardial infarction (NSTEMI), initial care episode ([place]) 03/05/2013  . Acute subendocardial infarction, initial episode of care ([place]) 03/05/2013  . Controlled type 2 diabetes mellitus with complication, without long-term current use of insulin ([place])   . Congestive heart failure ([place])   . Chronic obstructive pulmonary disease ([place])   . Peripheral neuropathy 07/19/2012    Past Surgical History:  Procedure Laterality Date  . ABDOMINAL HYSTERECTOMY    . APPENDECTOMY    . CHOLECYSTECTOMY    .  ESOPHAGOGASTRODUODENOSCOPY N/A 03/30/2013   Procedure: ESOPHAGOGASTRODUODENOSCOPY (EGD);  Surgeon: Lafayette Dragon, MD;  Location: [facility] ENDOSCOPY;  Service: Endoscopy;  Laterality: N/A;    Prior to Admission medications   Medication Sig Start Date End Date Taking? Authorizing Provider  ADVAIR DISKUS 250-50 MCG/DOSE AEPB Inhale 1 puff into the lungs 2 (two) times daily. 04/08/16   [provider]  aspirin EC 81 MG tablet Take 81 mg by mouth daily.    [provider]  atorvastatin (LIPITOR) 20 MG tablet Take 20 mg by mouth daily.    [provider]  Cholecalciferol (VITAMIN D-1000 MAX ST) 1000 units tablet Take by mouth.    [provider]  furosemide (LASIX) 20 MG tablet Take 40 mg by mouth 2 (two) times daily.     [provider]  glimepiride (AMARYL) 2 MG tablet Take 1 tablet by mouth daily. 02/22/17   [provider]  JANUVIA 25 MG tablet Take 25 mg by mouth daily. 04/20/16   [provider]  levothyroxine (SYNTHROID, LEVOTHROID) 50 MCG tablet Take 50 mcg by mouth daily before breakfast.  08/30/17   [provider]  losartan (COZAAR) 50 MG tablet Take 1 tablet by mouth daily.    [provider]  metoprolol tartrate (LOPRESSOR) 25 MG tablet Take 1 tablet (25 mg total) by mouth 2 (two) times daily. 05/14/16   Demetrios Loll, MD  OXYGEN Inhale 2 L/min into the lungs continuous.    [provider]  PROAIR HFA 108 [phone] Base) MCG/ACT inhaler Inhale  2 puffs into the lungs every 6 (six) hours as needed. For shortness of breath/wheezing. 04/08/16   [provider]    Allergies Oxycodone-acetaminophen; Allopurinol; Aurothioglucose; Darvocet [propoxyphene n-acetaminophen]; Gold-containing drug products; Penicillins; and Prednisone  Family History  Problem Relation Age of Onset  . Heart attack Mother   . Diabetes Mother   . Hypertension Father     Social History Social History   Tobacco Use  . Smoking status:  Former Smoker    Years: 30.00    Last attempt to quit: 11/08/1970    Years since quitting: 47.4  . Smokeless tobacco: Never Used  Substance Use Topics  . Alcohol use: No  . Drug use: No    Review of Systems Constitutional: No fever/chills Eyes: No visual changes. ENT: No sore throat. No stiff neck no neck pain Cardiovascular: Denies chest pain. Respiratory: + shortness of breath. Gastrointestinal:   no vomiting.  No diarrhea.  No constipation. Genitourinary: Negative for dysuria. Musculoskeletal: Negative lower extremity swelling Skin: Negative for rash. Neurological: Negative for severe headaches, focal weakness or numbness. Limited by patient dementia   ____________________________________________   PHYSICAL EXAM:  VITAL SIGNS: ED Triage Vitals  Enc Vitals Group     BP 04/10/18 1955 (!) 170/79     Pulse Rate 04/10/18 1955 73     Resp 04/10/18 1955 (!) 32     Temp 04/10/18 1955 97.9 F (36.6 C)     Temp Source 04/10/18 1955 Oral     SpO2 04/10/18 2015 100 %     Weight 04/10/18 1957 180 lb (81.6 kg)     Height 04/10/18 1957 5\' 2"  (1.575 m)     Head Circumference --      Peak Flow --      Pain Score 04/10/18 1957 0     Pain Loc --      Pain Edu? --      Excl. in [place]? --     Constitutional: Alert and oriented. Well appearing and in no acute distress. Eyes: Conjunctivae are normal Head: Atraumatic HEENT: No congestion/rhinnorhea. Mucous membranes are moist.  Oropharynx non-erythematous Neck:   Nontender with no meningismus, no masses, no stridor Cardiovascular: Normal rate, regular rhythm. Grossly normal heart sounds.  Good peripheral circulation. Respiratory: Increased respiratory effort speaks in full sentences lungs diminished in the bases Abdominal: Soft and nontender. No distention. No guarding no rebound Back:  There is no focal tenderness or step off.  there is no midline tenderness there are no lesions noted. there is no CVA tenderness  Musculoskeletal: No  lower extremity tenderness, no upper extremity tenderness. No joint effusions acute on chronic appearing significant bilateral IMA with pitting bilaterally Neurologic:  Normal speech and language. No gross focal neurologic deficits are appreciated.  Skin:  Skin is warm, dry and intact. No rash noted. Psychiatric: Mood and affect are normal. Speech and behavior are normal.  ____________________________________________   LABS (all labs ordered are listed, but only abnormal results are displayed)  Labs Reviewed  CBC WITH DIFFERENTIAL/PLATELET - Abnormal; Notable for the following components:      Result Value   WBC 26.4 (*)    RDW 16.1 (*)    All other components within normal limits  TROPONIN I - Abnormal; Notable for the following components:   Troponin I 0.04 (*)    All other components within normal limits  BRAIN NATRIURETIC PEPTIDE - Abnormal; Notable for the following components:   B Natriuretic Peptide 1,119.0 (*)  All other components within normal limits  BASIC METABOLIC PANEL - Abnormal; Notable for the following components:   Glucose, Bld 133 (*)    BUN 50 (*)    Creatinine, Ser 1.87 (*)    GFR calc non Af Amer 22 (*)    GFR calc Af Amer 26 (*)    All other components within normal limits    Pertinent labs  results that were available during my care of the patient were reviewed by me and considered in my medical decision making (see chart for details). ____________________________________________  EKG  I personally interpreted any EKGs ordered by me or triage Sinus rhythm rate 73 bpm no acute ST elevation or depression, flipped T waves noted inferiorly, consistent with old EKG in 2017.  ____________________________________________  RADIOLOGY  Pertinent labs & imaging results that were available during my care of the patient were reviewed by me and considered in my medical decision making (see chart for details). If possible, patient and/or family made aware of any  abnormal findings.  Dg Chest Port 1 View  Result Date: 04/10/2018 CLINICAL DATA:  Increasing shortness of breath EXAM: PORTABLE CHEST 1 VIEW COMPARISON:  05/11/2016 FINDINGS: Heart is normal size. Suspect small right pleural effusion with right base atelectasis. No confluent opacity or effusion on the left. Areas of scarring in the upper lobes. No acute bony abnormality. IMPRESSION: Suspect small right pleural effusion with right base atelectasis. Electronically Signed   By: Rolm Baptise M.D.   On: 04/10/2018 19:59   ____________________________________________    PROCEDURES  Procedure(s) performed: None  Procedures  Critical Care performed: CRITICAL CARE Performed by: Schuyler Amor   Total critical care time: 45 minutes  Critical care time was exclusive of separately billable procedures and treating other patients.  Critical care was necessary to treat or prevent imminent or life-threatening deterioration.  Critical care was time spent personally by me on the following activities: development of treatment plan with patient and/or surrogate as well as nursing, discussions with consultants, evaluation of patient's response to treatment, examination of patient, obtaining history from patient or surrogate, ordering and performing treatments and interventions, ordering and review of laboratory studies, ordering and review of radiographic studies, pulse oximetry and re-evaluation of patient's condition.   ____________________________________________   INITIAL IMPRESSION / ASSESSMENT AND PLAN / ED COURSE  Pertinent labs & imaging results that were available during my care of the patient were reviewed by me and considered in my medical decision making (see chart for details).  In here with shortness of breath, increased lower extremities edema, and desaturations on her home oxygen.  Patient does have some risk factors for PE but her BUN/creatinine cannot tolerate a CT scan by GFR.  I do  have clear other diagnoses in evidence of heart failure with leg swelling, orthopnea, elevated BNP.  We will start her on Lasix and see if we can diurese her.  Given her age and propensity to desaturate even when moving in the bed we will admit her there is no evidence of imminent respiratory collapse fortunately    ____________________________________________   FINAL CLINICAL IMPRESSION(S) / ED DIAGNOSES  Final diagnoses:  SOB (shortness of breath)  Congestive heart failure, unspecified HF chronicity, unspecified heart failure type [facility])      This chart was dictated using voice recognition software.  Despite best efforts to proofread,  errors can occur which can change meaning.      Schuyler Amor, MD 04/10/18 2227

## 2018-04-11 ENCOUNTER — Inpatient Hospital Stay
Admit: 2018-04-11 | Discharge: 2018-04-11 | Disposition: A | Discharge status: Home / Self Care | Payer: Medicare Other | Attending: Internal Medicine | Admitting: Internal Medicine

## 2018-04-11 LAB — ECHOCARDIOGRAM COMPLETE
HEIGHTINCHES: 62 in
WEIGHTICAEL: 2883.2 [oz_av]

## 2018-04-11 LAB — BASIC METABOLIC PANEL
ANION GAP: 14 (ref 5–15)
BUN: 50 mg/dL — AB (ref 6–20)
CALCIUM: 9.9 mg/dL (ref 8.9–10.3)
CO2: 23 mmol/L (ref 22–32)
Chloride: 103 mmol/L (ref 101–111)
Creatinine, Ser: 1.84 mg/dL — ABNORMAL HIGH (ref 0.44–1.00)
GFR calc Af Amer: 26 mL/min — ABNORMAL LOW (ref >60)
GFR, EST NON AFRICAN AMERICAN: 23 mL/min — AB (ref >60)
GLUCOSE: 106 mg/dL — AB (ref 65–99)
Potassium: 4.5 mmol/L (ref 3.5–5.1)
SODIUM: 140 mmol/L (ref 135–145)

## 2018-04-11 LAB — TROPONIN I
TROPONIN I: 0.03 ng/mL — AB (ref <0.03)
TROPONIN I: 0.03 ng/mL — AB (ref <0.03)
Troponin I: 0.04 ng/mL (ref <0.03)

## 2018-04-11 LAB — CBC
HCT: 39.4 % (ref 35.0–47.0)
HEMOGLOBIN: 13 g/dL (ref 12.0–16.0)
MCH: 29.8 pg (ref 26.0–34.0)
MCHC: 32.9 g/dL (ref 32.0–36.0)
MCV: 90.4 fL (ref 80.0–100.0)
Platelets: 197 10*3/uL (ref 150–440)
RBC: 4.36 MIL/uL (ref 3.80–5.20)
RDW: 15.8 % — AB (ref 11.5–14.5)
WBC: 26.3 10*3/uL — AB (ref 3.6–11.0)

## 2018-04-11 LAB — GLUCOSE, CAPILLARY
GLUCOSE-CAPILLARY: 121 mg/dL — AB (ref 65–99)
GLUCOSE-CAPILLARY: 99 mg/dL (ref 65–99)
Glucose-Capillary: 83 mg/dL (ref 65–99)
Glucose-Capillary: 167 mg/dL — ABNORMAL HIGH (ref 65–99)
Glucose-Capillary: 169 mg/dL — ABNORMAL HIGH (ref 65–99)

## 2018-04-11 MED ORDER — METOPROLOL TARTRATE 25 MG PO TABS
25.0000 mg | ORAL_TABLET | Freq: Every day | ORAL | Status: DC
  Administered 2018-04-11 – 2018-04-13 (×3): 25 mg via ORAL
  Filled 2018-04-11 (×3): qty 1

## 2018-04-11 MED ORDER — ONDANSETRON HCL 4 MG/2ML IJ SOLN: 4.0000 mg | Freq: Four times a day (QID) | INTRAMUSCULAR | Status: DC | PRN

## 2018-04-11 MED ORDER — LOSARTAN POTASSIUM 50 MG PO TABS
50.0000 mg | ORAL_TABLET | Freq: Every day | ORAL | Status: DC
  Filled 2018-04-11: qty 1

## 2018-04-11 MED ORDER — INSULIN ASPART 100 UNIT/ML ~~LOC~~ SOLN: 0.0000 [IU] | Freq: Every day | SUBCUTANEOUS | Status: DC

## 2018-04-11 MED ORDER — FUROSEMIDE 40 MG PO TABS
40.0000 mg | ORAL_TABLET | Freq: Every day | ORAL | Status: DC
  Administered 2018-04-11: 40 mg via ORAL
  Filled 2018-04-11: qty 1

## 2018-04-11 MED ORDER — ASPIRIN EC 81 MG PO TBEC
81.0000 mg | DELAYED_RELEASE_TABLET | Freq: Every day | ORAL | Status: DC
  Administered 2018-04-11 – 2018-04-13 (×3): 81 mg via ORAL
  Filled 2018-04-11 (×3): qty 1

## 2018-04-11 MED ORDER — MOMETASONE FURO-FORMOTEROL FUM 200-5 MCG/ACT IN AERO
2.0000 | INHALATION_SPRAY | Freq: Two times a day (BID) | RESPIRATORY_TRACT | Status: DC
  Administered 2018-04-11 – 2018-04-13 (×5): 2 via RESPIRATORY_TRACT
  Filled 2018-04-11: qty 8.8

## 2018-04-11 MED ORDER — INSULIN ASPART 100 UNIT/ML ~~LOC~~ SOLN
0.0000 [IU] | Freq: Three times a day (TID) | SUBCUTANEOUS | Status: DC
  Administered 2018-04-11: 1 [IU] via SUBCUTANEOUS
  Administered 2018-04-11 – 2018-04-12 (×2): 2 [IU] via SUBCUTANEOUS
  Administered 2018-04-12 – 2018-04-13 (×4): 1 [IU] via SUBCUTANEOUS
  Filled 2018-04-11 (×7): qty 1

## 2018-04-11 MED ORDER — IBUPROFEN 400 MG PO TABS
400.0000 mg | ORAL_TABLET | Freq: Once | ORAL | Status: AC
  Administered 2018-04-11: 400 mg via ORAL
  Filled 2018-04-11: qty 1

## 2018-04-11 MED ORDER — FUROSEMIDE 10 MG/ML IJ SOLN
40.0000 mg | Freq: Two times a day (BID) | INTRAMUSCULAR | Status: DC
Start: 2018-04-11 — End: 2018-04-13
  Administered 2018-04-11 – 2018-04-13 (×4): 40 mg via INTRAVENOUS
  Filled 2018-04-11 (×5): qty 4

## 2018-04-11 MED ORDER — LEVOTHYROXINE SODIUM 50 MCG PO TABS
50.0000 ug | ORAL_TABLET | Freq: Every day | ORAL | Status: DC
  Administered 2018-04-11 – 2018-04-13 (×3): 50 ug via ORAL
  Filled 2018-04-11 (×3): qty 1

## 2018-04-11 MED ORDER — ONDANSETRON HCL 4 MG PO TABS: 4.0000 mg | ORAL_TABLET | Freq: Four times a day (QID) | ORAL | Status: DC | PRN

## 2018-04-11 MED ORDER — ATORVASTATIN CALCIUM 20 MG PO TABS
20.0000 mg | ORAL_TABLET | Freq: Every day | ORAL | Status: DC
  Administered 2018-04-11 – 2018-04-13 (×3): 20 mg via ORAL
  Filled 2018-04-11 (×3): qty 1

## 2018-04-11 MED ORDER — HEPARIN SODIUM (PORCINE) 5000 UNIT/ML IJ SOLN
5000.0000 [IU] | Freq: Three times a day (TID) | INTRAMUSCULAR | Status: DC
  Administered 2018-04-11 – 2018-04-13 (×7): 5000 [IU] via SUBCUTANEOUS
  Filled 2018-04-11 (×7): qty 1

## 2018-04-11 NOTE — Plan of Care (Signed)
  Problem: Education: Goal: Knowledge of General Education information will improve Outcome: Progressing   Problem: Health Behavior/Discharge Planning: Goal: Ability to manage health-related needs will improve Outcome: Progressing   Problem: Clinical Measurements: Goal: Will remain free from infection Outcome: Progressing   Problem: Nutrition: Goal: Adequate nutrition will be maintained Outcome: Progressing   Problem: Safety: Goal: Ability to remain free from injury will improve Outcome: Progressing   Problem: Education: Goal: Ability to verbalize understanding of medication therapies will improve Outcome: Progressing

## 2018-04-11 NOTE — Progress Notes (Signed)
[place] at [facility]                                                                                                                                                                                  Patient Demographics   Chelsea [name], is a 84 y.o. female, DOB - 17-Oct-1925, IRS:[id number]  Admit date - 04/10/2018   Admitting Physician Lance Coon, MD  Outpatient Primary MD for the patient is Chelsea Pitch, MD   LOS - 1  Subjective: Patient admitted with shortness of breath noted to have CHF She states that she is feeling better shortness of breath improved   Review of Systems:   CONSTITUTIONAL: No documented fever. No fatigue, weakness. No weight gain, no weight loss.  EYES: No blurry or double vision.  ENT: No tinnitus. No postnasal drip. No redness of the oropharynx.  RESPIRATORY: No cough, no wheeze, no hemoptysis.  Positive dyspnea.  CARDIOVASCULAR: No chest pain. No orthopnea. No palpitations. No syncope.  GASTROINTESTINAL: No nausea, no vomiting or diarrhea. No abdominal pain. No melena or hematochezia.  GENITOURINARY: No dysuria or hematuria.  ENDOCRINE: No polyuria or nocturia. No heat or cold intolerance.  HEMATOLOGY: No anemia. No bruising. No bleeding.  INTEGUMENTARY: No rashes. No lesions.  MUSCULOSKELETAL: No arthritis. No swelling. No gout.  NEUROLOGIC: No numbness, tingling, or ataxia. No seizure-type activity.  PSYCHIATRIC: No anxiety. No insomnia. No ADD.    Vitals:   Vitals:   04/11/18 0321 04/11/18 0342 04/11/18 0810 04/11/18 0845  BP: (!) 128/56  (!) 107/54 (!) 108/52  Pulse: 68  64 66  Resp: 18  18   Temp: 97.9 F (36.6 C)     TempSrc: Oral     SpO2: 96%  94%   Weight:  81.7 kg (180 lb 3.2 oz)    Height:        Wt Readings from Last 3 Encounters:  04/11/18 81.7 kg (180 lb 3.2 oz)  02/23/18 81.6 kg (180 lb)  06/20/17 81.2 kg (179 lb)     Intake/Output Summary (Last 24 hours) at 04/11/2018 1526 Last data filed at  04/11/2018 1351 Gross per 24 hour  Intake 240 ml  Output 900 ml  Net -660 ml    Physical Exam:   GENERAL: Pleasant-appearing in no apparent distress.  HEAD, EYES, EARS, NOSE AND THROAT: Atraumatic, normocephalic. Extraocular muscles are intact. Pupils equal and reactive to light. Sclerae anicteric. No conjunctival injection. No oro-pharyngeal erythema.  NECK: Supple. There is no jugular venous distention. No bruits, no lymphadenopathy, no thyromegaly.  HEART: Regular rate and rhythm,. No murmurs, no rubs, no clicks.  LUNGS: Crackles at the bases bilaterally ABDOMEN: Soft,  flat, nontender, nondistended. Has good bowel sounds. No hepatosplenomegaly appreciated.  EXTREMITIES: No evidence of any cyanosis, clubbing, or peripheral edema.  +2 pedal and radial pulses bilaterally.  NEUROLOGIC: The patient is alert, awake, and oriented x3 with no focal motor or sensory deficits appreciated bilaterally.  SKIN: Moist and warm with no rashes appreciated.  Psych: Not anxious, depressed LN: No inguinal LN enlargement    Antibiotics   Anti-infectives (From admission, onward)   None      Medications   Scheduled Meds: . aspirin EC  81 mg Oral Daily  . atorvastatin  20 mg Oral Daily  . furosemide  40 mg Intravenous Q12H  . heparin  5,000 Units Subcutaneous Q8H  . insulin aspart  0-5 Units Subcutaneous QHS  . insulin aspart  0-9 Units Subcutaneous TID WC  . levothyroxine  50 mcg Oral QAC breakfast  . metoprolol tartrate  25 mg Oral Daily  . mometasone-formoterol  2 puff Inhalation BID   Continuous Infusions: PRN Meds:.ondansetron **OR** ondansetron (ZOFRAN) IV   Data Review:   Micro Results No results found for this or any previous visit (from the past 240 hour(s)).  Radiology Reports Dg Chest Port 1 View  Result Date: 04/10/2018 CLINICAL DATA:  Increasing shortness of breath EXAM: PORTABLE CHEST 1 VIEW COMPARISON:  05/11/2016 FINDINGS: Heart is normal size. Suspect small right pleural  effusion with right base atelectasis. No confluent opacity or effusion on the left. Areas of scarring in the upper lobes. No acute bony abnormality. IMPRESSION: Suspect small right pleural effusion with right base atelectasis. Electronically Signed   By: Rolm Baptise M.D.   On: 04/10/2018 19:59     CBC Recent Labs  Lab 04/10/18 1949 04/11/18 0149  WBC 26.4* 26.3*  HGB 12.5 13.0  HCT 38.4 39.4  PLT 215 197  MCV 90.2 90.4  MCH 29.3 29.8  MCHC 32.4 32.9  RDW 16.1* 15.8*  LYMPHSABS 15.9*  --   MONOABS 1.3*  --   EOSABS 0.8*  --   BASOSABS 0.0  --     Chemistries  Recent Labs  Lab 04/10/18 1949 04/11/18 0149  NA 139 140  K 5.0 4.5  CL 104 103  CO2 24 23  GLUCOSE 133* 106*  BUN 50* 50*  CREATININE 1.87* 1.84*  CALCIUM 9.8 9.9   ------------------------------------------------------------------------------------------------------------------ estimated creatinine clearance is 19.7 mL/min (A) (by C-G formula based on SCr of 1.84 mg/dL (H)). ------------------------------------------------------------------------------------------------------------------ No results for input(s): HGBA1C in the last 72 hours. ------------------------------------------------------------------------------------------------------------------ No results for input(s): CHOL, HDL, LDLCALC, TRIG, CHOLHDL, LDLDIRECT in the last 72 hours. ------------------------------------------------------------------------------------------------------------------ No results for input(s): TSH, T4TOTAL, T3FREE, THYROIDAB in the last 72 hours.  Invalid input(s): FREET3 ------------------------------------------------------------------------------------------------------------------ No results for input(s): VITAMINB12, FOLATE, FERRITIN, TIBC, IRON, RETICCTPCT in the last 72 hours.  Coagulation profile No results for input(s): INR, PROTIME in the last 168 hours.  No results for input(s): DDIMER in the last 72  hours.  Cardiac Enzymes Recent Labs  Lab 04/11/18 0149 04/11/18 0744 04/11/18 1358  TROPONINI 0.04* 0.03* 0.03*   ------------------------------------------------------------------------------------------------------------------ Invalid input(s): POCBNP    Assessment & Plan  Patient is 84 year old admitted with shortness of breath #1 acute on chronic systolic CHF (congestive heart failure) (HCC) continue IV Lasix  Echo of the heart pending cardiology evaluation pending   #2  Controlled type 2 diabetes mellitus with complication, without long-term current use of insulin (HCC) -sliding scale insulin with corresponding glucose checks #3  Chronic obstructive pulmonary disease ([place]) -no evidence of  exasperation home dose inhalers #4  HTN (hypertension) -continue home meds #5  Chronic lymphocytic leukemia ([place]) -patient has elevated white blood cell count at baseline, monitor #6  HLD (hyperlipidemia) -Home dose antilipid #7  CKD (chronic kidney disease), stage IV (HCC) -at baseline, avoid nephrotoxins and monitor       Code Status Orders  (From admission, onward)        Start     Ordered   04/11/18 0041  Do not attempt resuscitation (DNR)  Continuous    Question Answer Comment  In the event of cardiac or respiratory ARREST Do not call a "code blue"   In the event of cardiac or respiratory ARREST Do not perform Intubation, CPR, defibrillation or ACLS   In the event of cardiac or respiratory ARREST Use medication by any route, position, wound care, and other measures to relive pain and suffering. May use oxygen, suction and manual treatment of airway obstruction as needed for comfort.      04/11/18 0040    Code Status History    Date Active Date Inactive Code Status Order ID Comments User Context   05/11/2016 1727 05/15/2016 1354 DNR [id number]  Baxter Hire, MD Inpatient   03/01/2013 2204 04/06/2013 1652 Full Code [id number]  Codjie, Delphine Inpatient    Advance Directive  Documentation     Most Recent Value  Type of Advance Directive  Living will, Healthcare Power of Attorney  Pre-existing out of facility DNR order (yellow form or pink MOST form)  -  "MOST" Form in Place?  -           Consults cardiology   DVT Prophylaxis  Lovenox  Lab Results  Component Value Date   PLT 197 04/11/2018     Time Spent in minutes 35 minutes greater than 50% of time spent in care coordination and counseling patient regarding the condition and plan of care.   Dustin Flock M.D on 04/11/2018 at 3:26 PM  Between 7am to 6pm - Pager - [phone]  After 6pm go to [email or web address] - Proofreader  [employer]   Office  [phone]

## 2018-04-11 NOTE — Progress Notes (Addendum)
Cardiovascular and Pulmonary Nurse Navigator Note:  84 year old female who presented with SOB and lower extremity edema.  BNP on admission 1,119.  CXR:   Suspect small right pleural effusion with right base atelectasis.Patient admitted with acute on chronic diastolic CHF.  Patient is a resident of [facility] in [place].  Patient with history of HTN, diastolic CHF for which she takes Lasix, CKD, CLL, Glaucoma, Lymphedema, venous insufficiency, history of NSTEMI, HLD, CKD, DM II, COPD - oxygen dependent.  Echo performed today:    Transthoracic Echocardiography  Patient:    Chelsea [name], Chelsea [name] MR #:       [id number] Study Date: 04/11/2018 Gender:     F Age:        21 Height:     157.5 cm Weight:     81.7 kg BSA:        1.93 m^2 Pt. Status: Room:       256A   ADMITTING    Dayna Ramus  PERFORMING   [place]  ATTENDING    Charlotte Crumb A  SONOGRAPHER  Charmayne Sheer, RDCS  cc:  ------------------------------------------------------------------- LV EF: 55% -   65%  ------------------------------------------------------------------- Indications:      Congestive Heart Failure 428.0.  ------------------------------------------------------------------- History:   PMH:  NSTEMI, HLD, CKD.  Chronic obstructive pulmonary disease.  Risk factors:  Hypertension. Diabetes mellitus.  ------------------------------------------------------------------- Study Conclusions  - Left ventricle: The cavity size was normal. There was moderate   concentric hypertrophy. Systolic function was normal. The   estimated ejection fraction was in the range of 55% to 65%. - Aortic valve: There was mild stenosis. Valve area (VTI): 1.19   cm^2. Valve area (Vmax): 1.16 cm^2. Valve area (Vmean): 1.04   cm^2. - Mitral valve: There was mild regurgitation. Valve area by   pressure half-time: 2.29 cm^2. Valve area by continuity equation  (using LVOT flow): 2.48 cm^2. - Right ventricle: The cavity size was moderately dilated. Systolic   function was mildly reduced. - Right atrium: The atrium was mildly dilated. - Pulmonary arteries: PA peak pressure: 82 mm Hg (S).  __________________________________________________  Active Problem List this admission: 1. Acute on chronic diastolic CHF.  2. DM type 2 3. COPD - no exacerbation per MD 4. HTN  5. CLL - patient has elevated WBC count at baseline.   6. HLD 7. CKD stage IV - at baseline per MD  CHF Education:  Educational session with patient completed. Patient lying in bed with HOB elevated.  In addition, patient using two pillows.  Oxygen in use via Tea.   ?  Provided patient with "Living Better with Heart Failure" packet. Briefly reviewed definition of heart failure and signs and symptoms of an exacerbation.  ? *Reviewed importance of and reason behind checking weight daily in the AM, after using the bathroom, but before getting dressed. Patient has scales.  Patient reported she has been weighing herself for years, but not daily, as her weight remains the same.    Patient reported that her weight does not fluctuate.  Patient has chronic lower extremity edema from venous insufficiency, but was somewhat worse on the day of admission.    Reviewed the following information with patient:  *Discussed when to call the Dr= weight gain of >2lb overnight of 5lb in a week,  *Discussed yellow zone= call MD: weight gain of >2lb overnight of 5lb in a week,  increased swelling, increased SOB when lying down, chest discomfort, dizziness, increased fatigue. ??? *Red Zone= call 911: struggle to breath, fainting or near fainting, significant chest pain. ?  *Reviewed low sodium diet-provided handout of recommended and not recommended foods. Reviewed reading labels with patient.  Patient reports she receives 2 hot meals delivered to her home per day for $20 per day.  Patient does very little  cooking.  Ultimately, patient stated, "I am 84 years old and I am not going to alter my lifestyle."    *Discussed fluid intake with patient as well. Patient not currently on a fluid restriction, but advised no more than 8-8 ounces glass of fluids per day.??  *Instructed patient to take medications as prescribed for heart failure. Explained briefly why pt is on the medications (either make you feel better, live longer or keep you out of the hospital) and discussed monitoring and side effects.   *Exercise / Activity - Physical Therapy Evaluation pending.    *Smoking Cessation?- patient former smoker. ?  [facility] - Patient is an established patient of [facility].?Heart Failure Clinic f/u appointment scheduled for April 18, 2018 at 11:20 a.m.    Patient thanked me for stopping by and reviewing the above information with her.     Roanna Epley, RN, BSN, [facility] [place] [facility]  Cardiovascular &?Pulmonary Nurse Navigator  Direct Line: [phone]  Department Phone #: [phone] Fax: [phone]? Email Address: [email or web address]

## 2018-04-11 NOTE — Progress Notes (Signed)
*  PRELIMINARY RESULTS* Echocardiogram 2D Echocardiogram has been performed.  Chelsea [name] 04/11/2018, 11:19 AM

## 2018-04-11 NOTE — Clinical Social Work Note (Signed)
CSW was informed that patient is from [facility].  Independent living, would like patient to be evaluated for possible SNF placement due to some weakness.  CSW to continue to follow patient's progress pending PT recommendations.  CSW contacted physician to request PT orders.  Jones Broom. [place], MSW, [place]  04/11/2018 3:28 PM

## 2018-04-11 NOTE — Consult Note (Signed)
[facility] Cardiology  CARDIOLOGY CONSULT NOTE  Patient ID: Chelsea [name] MRN: [id number] DOB/AGE: 12/06/1924 84 y.o.  Admit date: 04/10/2018 Referring Physician Jannifer Franklin Primary Physician [name], MD  Primary Cardiologist [place] Reason for Consultation acute on chronic diastolic CHF  HPI: 84 year old female referred for evaluation of acute on chronic diastolic congestive heart failure.  The patient is a resident at [facility], and has a history of essential hypertension, diastolic CHF for which she takes Lasix, venous insufficiency, history of NSTEMI, hyperlipidemia, chronic kidney disease, type 2 diabetes, and COPD, oxygen dependent.  The patient is a poor historian and does not know why she is here. Much of the history was obtained from prior notes.  The patient was admitted for evaluation of shortness of breath and worsening lower extremity edema.  The patient denies chest pain. She cannot recall if she has had worsening shortness of breath. She sleeps upright in a recliner because it is easier to get out of. Admission labs notable for borderline elevated troponin of 0.04, followed by 0.04, chronically elevated WBC of 26.3, creatinine 1.87, BUN 50, and BNP 1119. Chest x-ray revealed small right pleural effusion with right base atelectasis.  ECG revealed sinus rhythm at a rate of 73 bpm. 2D echocardiogram 05/12/2016 revealed normal left ventricular function, with LVEF 55-65%, with mild mitral regurgitation and moderate tricuspid regurgitation. Currently, she denies pain, appears to be comfortable while eating breakfast upright in bed, and wishes to be discharged soon.   Review of systems complete and found to be negative unless listed above     Past Medical History:  Diagnosis Date  . CHF (congestive heart failure) ([place])   . Chronic kidney disease   . CLL (chronic lymphocytic leukemia) ([place])   . COPD (chronic obstructive pulmonary disease) ([place])   . Diabetes mellitus ([place])   . Glaucoma   .  Hyperlipidemia   . Hypertension   . Lymphedema   . Lymphocytic leukemia ([place])   . NSTEMI     Past Surgical History:  Procedure Laterality Date  . ABDOMINAL HYSTERECTOMY    . APPENDECTOMY    . CHOLECYSTECTOMY    . ESOPHAGOGASTRODUODENOSCOPY N/A 03/30/2013   Procedure: ESOPHAGOGASTRODUODENOSCOPY (EGD);  Surgeon: Lafayette Dragon, MD;  Location: [facility] ENDOSCOPY;  Service: Endoscopy;  Laterality: N/A;    Medications Prior to Admission  Medication Sig Dispense Refill Last Dose  . aspirin EC 81 MG tablet Take 81 mg by mouth daily.   unknown at 1000  . atorvastatin (LIPITOR) 20 MG tablet Take 20 mg by mouth daily.   unknown at 2200  . Cholecalciferol (VITAMIN D-1000 MAX ST) 1000 units tablet Take by mouth.   unknown at 1000  . furosemide (LASIX) 20 MG tablet Take 40 mg by mouth daily.    unknown at 1000  . glimepiride (AMARYL) 2 MG tablet Take 1 tablet by mouth daily.   unknown at 1000  . JANUVIA 25 MG tablet Take 25 mg by mouth daily.   unknown at 1000  . levothyroxine (SYNTHROID, LEVOTHROID) 50 MCG tablet Take 50 mcg by mouth daily before breakfast.    unknown at 0800  . losartan (COZAAR) 50 MG tablet Take 1 tablet by mouth daily.   unknown at 1000  . metoprolol tartrate (LOPRESSOR) 25 MG tablet Take 1 tablet (25 mg total) by mouth 2 (two) times daily. (Patient taking differently: Take 25 mg by mouth daily. )   unknown at 1000  . OXYGEN Inhale 2 L/min into the lungs continuous.  Taking  . potassium chloride SA (K-DUR,KLOR-CON) 20 MEQ tablet Take 20 mEq by mouth daily.   unknown at 1000  . PROAIR HFA 108 (90 Base) MCG/ACT inhaler Inhale 2 puffs into the lungs every 6 (six) hours as needed. For shortness of breath/wheezing.   prn at prn  . ADVAIR DISKUS 250-50 MCG/DOSE AEPB Inhale 1 puff into the lungs 2 (two) times daily.   Not Taking at Unknown time   Social History   Socioeconomic History  . Marital status: Married    Spouse name: Not on file  . Number of children: Not on file  . Years of  education: Not on file  . Highest education level: Not on file  Occupational History  . Occupation: Retired Licensed conveyancer at [employer]  . Financial resource strain: Not on file  . Food insecurity:    Worry: Not on file    Inability: Not on file  . Transportation needs:    Medical: Not on file    Non-medical: Not on file  Tobacco Use  . Smoking status: Former Smoker    Years: 30.00    Last attempt to quit: 11/08/1970    Years since quitting: 47.4  . Smokeless tobacco: Never Used  Substance and Sexual Activity  . Alcohol use: No  . Drug use: No  . Sexual activity: Not on file  Lifestyle  . Physical activity:    Days per week: Not on file    Minutes per session: Not on file  . Stress: Not on file  Relationships  . Social connections:    Talks on phone: Not on file    Gets together: Not on file    Attends religious service: Not on file    Active member of club or organization: Not on file    Attends meetings of clubs or organizations: Not on file    Relationship status: Not on file  . Intimate partner violence:    Fear of current or ex partner: Not on file    Emotionally abused: Not on file    Physically abused: Not on file    Forced sexual activity: Not on file  Other Topics Concern  . Not on file  Social History Narrative   Lives with husband at [facility] independent living.    Family History  Problem Relation Age of Onset  . Heart attack Mother   . Diabetes Mother   . Hypertension Father       Review of systems complete and found to be negative unless listed above      PHYSICAL EXAM  General: Well developed, well nourished, pleasant, in no acute distress, sitting upright in bed with supplemental oxygen, eating breakfast HEENT:  Normocephalic and atramatic Neck:  No JVD.  Lungs: Clear bilaterally to auscultation, normal effort of breathing.  No wheezing, rales, or rhonchi Heart: HRRR . Normal S1 and S2 without gallops or murmurs.  Abdomen:  Bowel sounds are positive, abdomen nondistended Msk:  Back normal. Normal strength and tone for age. Extremities: 2+ bilateral lower extremity edema.  Left leg with area of erythema and warmth without drainage. Neuro: Alert and oriented, forgetful Psych:  Good affect, responds appropriately  Labs:   Lab Results  Component Value Date   WBC 26.3 (H) 04/11/2018   HGB 13.0 04/11/2018   HCT 39.4 04/11/2018   MCV 90.4 04/11/2018   PLT 197 04/11/2018    Recent Labs  Lab 04/11/18 0149  NA 140  K 4.5  CL 103  CO2 23  BUN 50*  CREATININE 1.84*  CALCIUM 9.9  GLUCOSE 106*   Lab Results  Component Value Date   CKTOTAL 16 (L) 01/28/2014   CKMB < 0.5 (L) 01/28/2014   TROPONINI 0.04 (HH) 04/11/2018    Lab Results  Component Value Date   CHOL 143 03/26/2013   CHOL 167 03/19/2013   CHOL 149 03/12/2013   No results found for: HDL No results found for: [facility] Lab Results  Component Value Date   TRIG 119 03/26/2013   TRIG 91 03/19/2013   TRIG 123 03/12/2013   No results found for: CHOLHDL No results found for: LDLDIRECT    Radiology: Dg Chest Port 1 View  Result Date: 04/10/2018 CLINICAL DATA:  Increasing shortness of breath EXAM: PORTABLE CHEST 1 VIEW COMPARISON:  05/11/2016 FINDINGS: Heart is normal size. Suspect small right pleural effusion with right base atelectasis. No confluent opacity or effusion on the left. Areas of scarring in the upper lobes. No acute bony abnormality. IMPRESSION: Suspect small right pleural effusion with right base atelectasis. Electronically Signed   By: Rolm Baptise M.D.   On: 04/10/2018 19:59    EKG: Sinus rhythm  ASSESSMENT AND PLAN:  1.  Acute on chronic diastolic CHF, which presented with worsening of chronic shortness of breath and and worsening lower extremity edema, appears to have improved with initial diuresis.  2.  Chronic kidney disease stage IV 3.  Coronary artery disease, status post NSTEMI, currently without chest pain 4.   Borderline elevated troponin of 0.04, 0.04, in the absence of chest pain 5.  Essential hypertension, low normal on losartan, metoprolol tartrate, Lasix  Plan: 1.  Agree with overall therapy 2.  IV Lasix with careful monitoring of renal status and blood pressure 3.  2D echocardiogram 4. Continue metoprolol with careful monitoring of heart rate 5. Hold Losartan for now in the setting of low blood pressure; resume when blood pressure permits  Signed: Layloni Fahrner PA-C 04/11/2018, 8:13 AM

## 2018-04-12 ENCOUNTER — Inpatient Hospital Stay: Payer: Medicare Other

## 2018-04-12 ENCOUNTER — Encounter: Payer: Self-pay | Admitting: Radiology

## 2018-04-12 LAB — BASIC METABOLIC PANEL
Anion gap: 11 (ref 5–15)
BUN: 69 mg/dL — ABNORMAL HIGH (ref 6–20)
CHLORIDE: 100 mmol/L — AB (ref 101–111)
CO2: 25 mmol/L (ref 22–32)
CREATININE: 1.83 mg/dL — AB (ref 0.44–1.00)
Calcium: 9.1 mg/dL (ref 8.9–10.3)
GFR calc Af Amer: 27 mL/min — ABNORMAL LOW (ref >60)
GFR calc non Af Amer: 23 mL/min — ABNORMAL LOW (ref >60)
Glucose, Bld: 147 mg/dL — ABNORMAL HIGH (ref 65–99)
POTASSIUM: 4 mmol/L (ref 3.5–5.1)
SODIUM: 136 mmol/L (ref 135–145)

## 2018-04-12 LAB — GLUCOSE, CAPILLARY
GLUCOSE-CAPILLARY: 139 mg/dL — AB (ref 65–99)
GLUCOSE-CAPILLARY: 170 mg/dL — AB (ref 65–99)
GLUCOSE-CAPILLARY: 141 mg/dL — AB (ref 65–99)
GLUCOSE-CAPILLARY: 96 mg/dL (ref 65–99)

## 2018-04-12 MED ORDER — TECHNETIUM TO 99M ALBUMIN AGGREGATED
4.0000 | Freq: Once | INTRAVENOUS | Status: AC | PRN
  Administered 2018-04-12: 3.968 via INTRAVENOUS

## 2018-04-12 MED ORDER — TECHNETIUM TC 99M DIETHYLENETRIAME-PENTAACETIC ACID
33.6110 | Freq: Once | INTRAVENOUS | Status: AC | PRN
Start: 2018-04-12 — End: 2018-04-12
  Administered 2018-04-12: 33.611 via RESPIRATORY_TRACT

## 2018-04-12 NOTE — Progress Notes (Signed)
[facility]  SUBJECTIVE: The patient was sitting upright in bed eating breakfast upon this interview. She reports feeling fine this morning with no pain, normal effort of breathing on supplemental oxygen, which is chronic for her.   Vitals:   04/11/18 1628 04/11/18 2012 04/12/18 0354 04/12/18 0735  BP: (!) 112/45 (!) 114/55 (!) 128/57 (!) 110/48  Pulse: 67 67 75 71  Resp: 18 16 16    Temp: 98.1 F (36.7 C) 98.7 F (37.1 C) 98.8 F (37.1 C) 97.6 F (36.4 C)  TempSrc: Oral Oral Oral Oral  SpO2: 92% 90% 90% 92%  Weight:   80.3 kg (177 lb 1.6 oz)   Height:         Intake/Output Summary (Last 24 hours) at 04/12/2018 1306 Last data filed at 04/12/2018 1253 Gross per 24 hour  Intake 360 ml  Output 2700 ml  Net -2340 ml      PHYSICAL EXAM  General: Well developed, well nourished, in no acute distress HEENT:  Normocephalic and atramatic Neck:  No JVD.  Lungs: Clear bilaterally to auscultation. On supplemental oxygen Heart: HRRR . Normal S1 and S2  Abdomen: Bowel sounds are positive, abdomen soft and non-tender  Msk:  Back normal, sitting upright in bed. Extremities: 2+ bilateral lower extremity edema.   Neuro: Alert and oriented Psych:  Good affect, responds appropriately   LABS: Basic Metabolic Panel: Recent Labs    04/11/18 0149 04/12/18 0704  NA 140 136  K 4.5 4.0  CL 103 100*  CO2 23 25  GLUCOSE 106* 147*  BUN 50* 69*  CREATININE 1.84* 1.83*  CALCIUM 9.9 9.1   Liver Function Tests: No results for input(s): AST, ALT, ALKPHOS, BILITOT, PROT, ALBUMIN in the last 72 hours. No results for input(s): LIPASE, AMYLASE in the last 72 hours. CBC: Recent Labs    04/10/18 1949 04/11/18 0149  WBC 26.4* 26.3*  NEUTROABS 8.4*  --   HGB 12.5 13.0  HCT 38.4 39.4  MCV 90.2 90.4  PLT 215 197   Cardiac Enzymes: Recent Labs    04/11/18 0149 04/11/18 0744 04/11/18 1358  TROPONINI 0.04* 0.03* 0.03*   BNP: Invalid input(s): POCBNP D-Dimer: No results for  input(s): DDIMER in the last 72 hours. Hemoglobin A1C: No results for input(s): HGBA1C in the last 72 hours. Fasting Lipid Panel: No results for input(s): CHOL, HDL, LDLCALC, TRIG, CHOLHDL, LDLDIRECT in the last 72 hours. Thyroid Function Tests: No results for input(s): TSH, T4TOTAL, T3FREE, THYROIDAB in the last 72 hours.  Invalid input(s): FREET3 Anemia Panel: No results for input(s): VITAMINB12, FOLATE, FERRITIN, TIBC, IRON, RETICCTPCT in the last 72 hours.  Dg Chest Port 1 View  Result Date: 04/12/2018 CLINICAL DATA:  Acute shortness of breath. EXAM: PORTABLE CHEST 1 VIEW COMPARISON:  04/10/2018 and prior exam FINDINGS: The cardiomediastinal silhouette is unremarkable. Mild interstitial prominence again identified. Mild RIGHT basilar atelectasis and probable trace RIGHT pleural effusion again noted. No pneumothorax or new pulmonary opacities. IMPRESSION: Unchanged appearance of the chest with interstitial prominence, mild RIGHT basilar atelectasis and probable trace RIGHT pleural effusion. Electronically Signed   By: Margarette Canada M.D.   On: 04/12/2018 12:03   Dg Chest Port 1 View  Result Date: 04/10/2018 CLINICAL DATA:  Increasing shortness of breath EXAM: PORTABLE CHEST 1 VIEW COMPARISON:  05/11/2016 FINDINGS: Heart is normal size. Suspect small right pleural effusion with right base atelectasis. No confluent opacity or effusion on the left. Areas of scarring in the upper lobes. No acute bony abnormality.  IMPRESSION: Suspect small right pleural effusion with right base atelectasis. Electronically Signed   By: Rolm Baptise M.D.   On: 04/10/2018 19:59     Echo LV EF 55-65%, mild AS, mild MR  TELEMETRY: sinus rhythm,   ASSESSMENT AND PLAN:  Principal Problem:   Acute on chronic systolic CHF (congestive heart failure) (HCC) Active Problems:   Controlled type 2 diabetes mellitus with complication, without long-term current use of insulin (HCC)   Chronic obstructive pulmonary disease  (HCC)   HTN (hypertension)   Chronic lymphocytic leukemia (HCC)   HLD (hyperlipidemia)   CKD (chronic kidney disease), stage IV ([place])    1. Acute on chronic diastolic CHF, which presented with worsening of chronic shortness of breath and and worsening lower extremity edema, appears to have improved with diuresis. Patient on chronic O2 therapy. 2D echocardiogram revealed normal left ventricular function. 2. Chronic kidney disease stage IV, BUN increased from 50 to 69 3. CAD, status post NSTEMI, currently without chest pain 4.  Borderline elevated troponin of 0.04, 0.04, in the absence of chest pain 5.  Essential hypertension, low normal on losartan, metoprolol tartrate, Lasix  Plan: 1. Agree with overall therapy 2. Avoid nephrotoxins 3. Continue metoprolol tartrate 4. Continue to hold Losartan with low blood pressure    Danitra Payano, PA-C 04/12/2018 1:06 PM

## 2018-04-12 NOTE — Clinical Social Work Note (Signed)
CSW was informed that patient will need SNF, patient is from [facility].  CSW attempted to see patient to complete assessment, however she was in a procedure, CSW to complete assessment at a later time.  Jones Broom. [place], MSW, [place]  04/12/2018 2:59 PM

## 2018-04-12 NOTE — Progress Notes (Signed)
[place] at [facility]                                                                                                                                                                                  Patient Demographics   Chelsea [name], is a 84 y.o. female, DOB - 1925-09-23, NLZ:[id number]  Admit date - 04/10/2018   Admitting Physician Lance Coon, MD  Outpatient Primary MD for the patient is [name], MD   LOS - 2  Subjective: Shortness of breath improved denies any chest pain   Review of Systems:   CONSTITUTIONAL: No documented fever. No fatigue, weakness. No weight gain, no weight loss.  EYES: No blurry or double vision.  ENT: No tinnitus. No postnasal drip. No redness of the oropharynx.  RESPIRATORY: No cough, no wheeze, no hemoptysis.  Positive dyspnea.  CARDIOVASCULAR: No chest pain. No orthopnea. No palpitations. No syncope.  GASTROINTESTINAL: No nausea, no vomiting or diarrhea. No abdominal pain. No melena or hematochezia.  GENITOURINARY: No dysuria or hematuria.  ENDOCRINE: No polyuria or nocturia. No heat or cold intolerance.  HEMATOLOGY: No anemia. No bruising. No bleeding.  INTEGUMENTARY: No rashes. No lesions.  MUSCULOSKELETAL: No arthritis. No swelling. No gout.  NEUROLOGIC: No numbness, tingling, or ataxia. No seizure-type activity.  PSYCHIATRIC: No anxiety. No insomnia. No ADD.    Vitals:   Vitals:   04/11/18 2012 04/12/18 0354 04/12/18 0735 04/12/18 1524  BP: (!) 114/55 (!) 128/57 (!) 110/48 (!) 113/49  Pulse: 67 75 71 72  Resp: 16 16    Temp: 98.7 F (37.1 C) 98.8 F (37.1 C) 97.6 F (36.4 C) 98.1 F (36.7 C)  TempSrc: Oral Oral Oral Oral  SpO2: 90% 90% 92% 100%  Weight:  80.3 kg (177 lb 1.6 oz)    Height:        Wt Readings from Last 3 Encounters:  04/12/18 80.3 kg (177 lb 1.6 oz)  02/23/18 81.6 kg (180 lb)  06/20/17 81.2 kg (179 lb)     Intake/Output Summary (Last 24 hours) at 04/12/2018 1525 Last data filed  at 04/12/2018 1348 Gross per 24 hour  Intake 120 ml  Output 2700 ml  Net -2580 ml    Physical Exam:   GENERAL: Pleasant-appearing in no apparent distress.  HEAD, EYES, EARS, NOSE AND THROAT: Atraumatic, normocephalic. Extraocular muscles are intact. Pupils equal and reactive to light. Sclerae anicteric. No conjunctival injection. No oro-pharyngeal erythema.  NECK: Supple. There is no jugular venous distention. No bruits, no lymphadenopathy, no thyromegaly.  HEART: Regular rate and rhythm,. No murmurs, no rubs, no clicks.  LUNGS: Crackles at the bases bilaterally ABDOMEN: Soft, flat, nontender, nondistended.  Has good bowel sounds. No hepatosplenomegaly appreciated.  EXTREMITIES: No evidence of any cyanosis, clubbing, or peripheral edema.  +2 pedal and radial pulses bilaterally.  NEUROLOGIC: The patient is alert, awake, and oriented x3 with no focal motor or sensory deficits appreciated bilaterally.  SKIN: Moist and warm with no rashes appreciated.  Psych: Not anxious, depressed LN: No inguinal LN enlargement    Antibiotics   Anti-infectives (From admission, onward)   None      Medications   Scheduled Meds: . aspirin EC  81 mg Oral Daily  . atorvastatin  20 mg Oral Daily  . furosemide  40 mg Intravenous Q12H  . heparin  5,000 Units Subcutaneous Q8H  . insulin aspart  0-5 Units Subcutaneous QHS  . insulin aspart  0-9 Units Subcutaneous TID WC  . levothyroxine  50 mcg Oral QAC breakfast  . metoprolol tartrate  25 mg Oral Daily  . mometasone-formoterol  2 puff Inhalation BID   Continuous Infusions: PRN Meds:.ondansetron **OR** ondansetron (ZOFRAN) IV   Data Review:   Micro Results No results found for this or any previous visit (from the past 240 hour(s)).  Radiology Reports Nm Pulmonary Perf And Vent  Result Date: 04/12/2018 CLINICAL DATA:  Shortness of breath EXAM: NUCLEAR MEDICINE VENTILATION - PERFUSION LUNG SCAN VIEWS: Anterior, posterior, left lateral, right  lateral, RPO LPO, RAO, LAO-ventilation and perfusion RADIOPHARMACEUTICALS:  33.611 mCi of Tc-34m DTPA aerosol inhalation and 3.968 mCi Tc31m-MAA IV COMPARISON:  Chest radiograph April 12, 2018 FINDINGS: Ventilation: There are several subsegmental areas of photopenia on ventilation study. No segmental level ventilation defects are evident. Perfusion: There are subsegmental areas of photopenia which is centrally match areas of decreased ventilation. There are no segmental level areas of decreased uptake on the perfusion study, and there is no appreciable perfusion defect without corresponding ventilation defect. IMPRESSION: Scattered matching subsegmental ventilation perfusion defects. No segmental level perfusion defects are evident, and there is no significant ventilation/perfusion mismatch. This study constitutes an overall low probability of pulmonary embolism based on PIOPED II criteria. Electronically Signed   By: Lowella Grip III M.D.   On: 04/12/2018 15:21   Dg Chest Port 1 View  Result Date: 04/12/2018 CLINICAL DATA:  Acute shortness of breath. EXAM: PORTABLE CHEST 1 VIEW COMPARISON:  04/10/2018 and prior exam FINDINGS: The cardiomediastinal silhouette is unremarkable. Mild interstitial prominence again identified. Mild RIGHT basilar atelectasis and probable trace RIGHT pleural effusion again noted. No pneumothorax or new pulmonary opacities. IMPRESSION: Unchanged appearance of the chest with interstitial prominence, mild RIGHT basilar atelectasis and probable trace RIGHT pleural effusion. Electronically Signed   By: Margarette Canada M.D.   On: 04/12/2018 12:03   Dg Chest Port 1 View  Result Date: 04/10/2018 CLINICAL DATA:  Increasing shortness of breath EXAM: PORTABLE CHEST 1 VIEW COMPARISON:  05/11/2016 FINDINGS: Heart is normal size. Suspect small right pleural effusion with right base atelectasis. No confluent opacity or effusion on the left. Areas of scarring in the upper lobes. No acute bony  abnormality. IMPRESSION: Suspect small right pleural effusion with right base atelectasis. Electronically Signed   By: Rolm Baptise M.D.   On: 04/10/2018 19:59     CBC Recent Labs  Lab 04/10/18 1949 04/11/18 0149  WBC 26.4* 26.3*  HGB 12.5 13.0  HCT 38.4 39.4  PLT 215 197  MCV 90.2 90.4  MCH 29.3 29.8  MCHC 32.4 32.9  RDW 16.1* 15.8*  LYMPHSABS 15.9*  --   MONOABS 1.3*  --   EOSABS 0.8*  --  BASOSABS 0.0  --     Chemistries  Recent Labs  Lab 04/10/18 1949 04/11/18 0149 04/12/18 0704  NA 139 140 136  K 5.0 4.5 4.0  CL 104 103 100*  CO2 24 23 25   GLUCOSE 133* 106* 147*  BUN 50* 50* 69*  CREATININE 1.87* 1.84* 1.83*  CALCIUM 9.8 9.9 9.1   ------------------------------------------------------------------------------------------------------------------ estimated creatinine clearance is 19.7 mL/min (A) (by C-G formula based on SCr of 1.83 mg/dL (H)). ------------------------------------------------------------------------------------------------------------------ No results for input(s): HGBA1C in the last 72 hours. ------------------------------------------------------------------------------------------------------------------ No results for input(s): CHOL, HDL, LDLCALC, TRIG, CHOLHDL, LDLDIRECT in the last 72 hours. ------------------------------------------------------------------------------------------------------------------ No results for input(s): TSH, T4TOTAL, T3FREE, THYROIDAB in the last 72 hours.  Invalid input(s): FREET3 ------------------------------------------------------------------------------------------------------------------ No results for input(s): VITAMINB12, FOLATE, FERRITIN, TIBC, IRON, RETICCTPCT in the last 72 hours.  Coagulation profile No results for input(s): INR, PROTIME in the last 168 hours.  No results for input(s): DDIMER in the last 72 hours.  Cardiac Enzymes Recent Labs  Lab 04/11/18 0149 04/11/18 0744 04/11/18 1358   TROPONINI 0.04* 0.03* 0.03*   ------------------------------------------------------------------------------------------------------------------ Invalid input(s): POCBNP    Assessment & Plan  Patient is 84 year old admitted with shortness of breath #1 acute on chronic systolic CHF (congestive heart failure) (HCC) continue IV Echo shows normal EF #2  Controlled type 2 diabetes mellitus with complication, without long-term current use of insulin (HCC) -sliding scale insulin with corresponding glucose checks #3    Severe pulmonary hypertension noted on echo I ordered a VQ scan which was low probability #4  HTN (hypertension) -continue home meds #5  Chronic lymphocytic leukemia ([place]) -patient has elevated white blood cell count at baseline, monitor #6    Chronic kidney disease stage IV  #7  hyperlipidemia continue current management      Code Status Orders  (From admission, onward)        Start     Ordered   04/11/18 0041  Do not attempt resuscitation (DNR)  Continuous    Question Answer Comment  In the event of cardiac or respiratory ARREST Do not call a "code blue"   In the event of cardiac or respiratory ARREST Do not perform Intubation, CPR, defibrillation or ACLS   In the event of cardiac or respiratory ARREST Use medication by any route, position, wound care, and other measures to relive pain and suffering. May use oxygen, suction and manual treatment of airway obstruction as needed for comfort.      04/11/18 0040    Code Status History    Date Active Date Inactive Code Status Order ID Comments User Context   05/11/2016 1727 05/15/2016 1354 DNR [id number]  Baxter Hire, MD Inpatient   03/01/2013 2204 04/06/2013 1652 Full Code [id number]  Codjie, Delphine Inpatient    Advance Directive Documentation     Most Recent Value  Type of Advance Directive  Living will, Healthcare Power of Attorney  Pre-existing out of facility DNR order (yellow form or pink MOST form)  -  "MOST"  Form in Place?  -           Consults cardiology   DVT Prophylaxis  Lovenox  Lab Results  Component Value Date   PLT 197 04/11/2018     Time Spent in minutes 35 minutes greater than 50% of time spent in care coordination and counseling patient regarding the condition and plan of care.   Dustin Flock M.D on 04/12/2018 at 3:25 PM  Between 7am to 6pm - Pager - [phone]  After 6pm go to [email or web address] - Proofreader  [employer]   Office  [phone]

## 2018-04-12 NOTE — Plan of Care (Signed)
  Problem: Clinical Measurements: Goal: Ability to maintain clinical measurements within normal limits will improve Outcome: Not Progressing Note:  B.U.N. level increased today from 50 to 69. Will continue to monitor renal status. [facility]

## 2018-04-12 NOTE — Evaluation (Signed)
Physical Therapy Evaluation Patient Details Name: Chelsea [name] MRN: [id number] DOB: [birth year] Today's Date: 04/12/2018   History of Present Illness  Pt admitted for acute on chronic CHF. Pt with complaints of SOB with LE edema. HIstory includes CHF, CLL, COPD, DM, glaucoma, HTN, and NSTEMI. Pt on 2L of O2 at baseline.  Clinical Impression  Pt is a pleasant 84 year old female who was admitted for acute on chronic CHF. Pt performs bed mobility with cga, transfers with min assist, and ambulation with min assist. Pt demonstrates deficits with strength/mobility/endurance. All mobility performed on 2-3L of O2 with quick desat noted. Further ambulation deferred. RN notified. Pt does not appear at baseline level and ultimately needs more assistance due to deficits. Would benefit from skilled PT to address above deficits and promote optimal return to PLOF; recommend transition to STR upon discharge from acute hospitalization.       Follow Up Recommendations SNF    Equipment Recommendations  None recommended by PT    Recommendations for Other Services       Precautions / Restrictions Precautions Precautions: Fall Restrictions Weight Bearing Restrictions: No      Mobility  Bed Mobility Overal bed mobility: Needs Assistance Bed Mobility: Supine to Sit     Supine to sit: Min guard     General bed mobility comments: needs cues for sequencing and pt becomes SOB with exertion. All mobility performed on 2L of O2, however sats decrease to 88% while seated at EOB, increased to 3L of O2 with further exertion.  Transfers Overall transfer level: Needs assistance Equipment used: Rolling walker (2 wheeled) Transfers: Sit to/from Stand Sit to Stand: Min assist         General transfer comment: transfers performed with upright posture without AD, however pt begins reaching for wall and bed railing as she is unsteady in static stance. REturned to seated position. 2nd attempt performed with RW and  min assist. Needs cues for pushing from seated surface.  Ambulation/Gait Ambulation/Gait assistance: Min assist Ambulation Distance (Feet): 3 Feet Assistive device: Rolling walker (2 wheeled) Gait Pattern/deviations: Step-to pattern     General Gait Details: able to take small steps towards recliner with use of RW. Decreased sats noted on 3L of O2 to 83%. Further ambulation deferred at this time. Notified RN and cues given for pursed lip breathing. With extended time, O2 sats improved to 91%. Left on 3L of O2 per RN.  Stairs            Wheelchair Mobility    Modified Rankin (Stroke Patients Only)       Balance Overall balance assessment: Needs assistance Sitting-balance support: Feet supported Sitting balance-Leahy Scale: Good     Standing balance support: Bilateral upper extremity supported Standing balance-Leahy Scale: Good Standing balance comment: with RW                             Pertinent Vitals/Pain Pain Assessment: Faces Faces Pain Scale: Hurts a little bit Pain Location: B calf Pain Descriptors / Indicators: Cramping Pain Intervention(s): Limited activity within patient's tolerance    Home Living Family/patient expects to be discharged to:: Private residence([facility]) Living Arrangements: Alone(has aide that assist) Available Help at Discharge: Available PRN/intermittently Type of Home: Independent living facility Home Access: Level entry     Home Layout: One level Home Equipment: Walker - 2 wheels;Cane - single point Additional Comments: Has meals brought to her. Has aide  assistance from 10-12p and 5-7p daily. Pt has equipment, however does not use    Prior Function Level of Independence: Independent         Comments: reports no falls, indep without AD, however furniture walks and holds onto walls. Reports she only ambulates short room to room distances.     Hand Dominance        Extremity/Trunk Assessment   Upper  Extremity Assessment Upper Extremity Assessment: Generalized weakness(B UE grossly 4/5)    Lower Extremity Assessment Lower Extremity Assessment: Generalized weakness(B LE grossly 3+/5)       Communication   Communication: No difficulties  Cognition Arousal/Alertness: Awake/alert Behavior During Therapy: WFL for tasks assessed/performed Overall Cognitive Status: Within Functional Limits for tasks assessed                                        General Comments      Exercises Other Exercises Other Exercises: supine ther-ex performed on B LE including ankle pumps, SLRs, and hip abd/add. ALl ther-ex performed x 10 reps with min assist.   Assessment/Plan    PT Assessment Patient needs continued PT services  PT Problem List Decreased strength;Decreased activity tolerance;Decreased balance;Decreased mobility;Decreased knowledge of use of DME       PT Treatment Interventions Gait training;DME instruction;Therapeutic exercise;Balance training    PT Goals (Current goals can be found in the Care Plan section)  Acute Rehab PT Goals Patient Stated Goal: to go home and maintain independence PT Goal Formulation: With patient Time For Goal Achievement: 04/26/18 Potential to Achieve Goals: Good    Frequency Min 2X/week   Barriers to discharge        Co-evaluation               AM-PAC PT "6 Clicks" Daily Activity  Outcome Measure Difficulty turning over in bed (including adjusting bedclothes, sheets and blankets)?: Unable Difficulty moving from lying on back to sitting on the side of the bed? : Unable Difficulty sitting down on and standing up from a chair with arms (e.g., wheelchair, bedside commode, etc,.)?: Unable Help needed moving to and from a bed to chair (including a wheelchair)?: A Little Help needed walking in hospital room?: A Lot Help needed climbing 3-5 steps with a railing? : Total 6 Click Score: 9    End of Session Equipment Utilized  During Treatment: Gait belt Activity Tolerance: Treatment limited secondary to medical complications (Comment) Patient left: in chair;with chair alarm set;with nursing/sitter in room Nurse Communication: Mobility status PT Visit Diagnosis: Unsteadiness on feet (R26.81);Muscle weakness (generalized) (M62.81);Difficulty in walking, not elsewhere classified (R26.2)    Time: 1008-1040 PT Time Calculation (min) (ACUTE ONLY): 32 min   Charges:   PT Evaluation $PT Eval Low Complexity: 1 Low PT Treatments $Therapeutic Exercise: 8-22 mins   PT G Codes:        Greggory Stallion, PT, DPT [phone]   Barbie Croston 04/12/2018, 2:29 PM

## 2018-04-12 NOTE — Plan of Care (Signed)
?  Problem: Elimination: ?Goal: Will not experience complications related to urinary retention ?Outcome: Progressing ?  ?

## 2018-04-12 NOTE — NC FL2 (Signed)
[place] LEVEL OF CARE SCREENING TOOL     IDENTIFICATION  Patient Name: Chelsea [name] Birthdate: [birth year] Sex: female Admission Date (Current Location): 04/10/2018  [place] and [place] Number:  Engineering geologist and Address:  [facility], [address] [place], [place] [address]      Provider Number: [id number]  Attending Physician Name and Address:  Dustin Flock, MD  Relative Name and Phone Number:  August Albino Daughter  [phone] or [place]  [phone]     Current Level of Care: Hospital Recommended Level of Care: [place] Prior Approval Number:    Date Approved/Denied:   PASRR Number: [id number]  Discharge Plan: SNF    Current Diagnoses: Patient Active Problem List   Diagnosis Date Noted  . Acute on chronic systolic CHF (congestive heart failure) ([place]) 04/10/2018  . CKD (chronic kidney disease), stage IV ([place]) 04/10/2018  . Chronic lymphocytic leukemia ([place]) 02/23/2017  . HTN (hypertension) 06/07/2016  . HLD (hyperlipidemia) 12/24/2015  . Gastric ulcer 03/30/2013  . C. difficile colitis 03/05/2013  . Non-ST elevation myocardial infarction (NSTEMI), initial care episode ([place]) 03/05/2013  . Acute subendocardial infarction, initial episode of care ([place]) 03/05/2013  . Controlled type 2 diabetes mellitus with complication, without long-term current use of insulin ([place])   . Congestive heart failure ([place])   . Chronic obstructive pulmonary disease ([place])   . Peripheral neuropathy 07/19/2012    Orientation RESPIRATION BLADDER Height & Weight     Self, Situation, Place, Time  O2(2L) Incontinent Weight: 177 lb 1.6 oz (80.3 kg) Height:  5\' 2"  (157.5 cm)  BEHAVIORAL SYMPTOMS/MOOD NEUROLOGICAL BOWEL NUTRITION STATUS      Continent Diet(Cardiac diet Carb Modified)  AMBULATORY STATUS COMMUNICATION OF NEEDS Skin   Limited Assist Verbally Normal                        Personal Care Assistance Level of Assistance  Bathing, Feeding, Dressing Bathing Assistance: Limited assistance Feeding assistance: Independent Dressing Assistance: Limited assistance     Functional Limitations Info  Sight, Hearing, Speech Sight Info: Adequate Hearing Info: Adequate Speech Info: Adequate    SPECIAL CARE FACTORS FREQUENCY  PT (By licensed PT)     PT Frequency: 5x a week              Contractures Contractures Info: Not present    Additional Factors Info  Code Status, Allergies, Insulin Sliding Scale Code Status Info: DNR Allergies Info: OXYCODONE-ACETAMINOPHEN, ALLOPURINOL, AUROTHIOGLUCOSE, DARVOCET PROPOXYPHENE N-ACETAMINOPHEN, GOLD-CONTAINING DRUG PRODUCTS, PENICILLINS, PREDNISONE    Insulin Sliding Scale Info: insulin aspart (novoLOG) injection 0-9 Units 3x a day with meals.       Current Medications (04/12/2018):  This is the current hospital active medication list Current Facility-Administered Medications  Medication Dose Route Frequency Provider Last Rate Last Dose  . aspirin EC tablet 81 mg  81 mg Oral Daily Lance Coon, MD   81 mg at 04/12/18 0902  . atorvastatin (LIPITOR) tablet 20 mg  20 mg Oral Daily Lance Coon, MD   20 mg at 04/11/18 1732  . furosemide (LASIX) injection 40 mg  40 mg Intravenous Q12H Dustin Flock, MD   40 mg at 04/12/18 0902  . heparin injection 5,000 Units  5,000 Units Subcutaneous Driscilla Moats, MD   5,000 Units at 04/12/18 0827  . insulin aspart (novoLOG) injection 0-5 Units  0-5 Units Subcutaneous QHS Lance Coon, MD      .  insulin aspart (novoLOG) injection 0-9 Units  0-9 Units Subcutaneous TID WC Lance Coon, MD   2 Units at 04/12/18 1219  . levothyroxine (SYNTHROID, LEVOTHROID) tablet 50 mcg  50 mcg Oral QAC breakfast Lance Coon, MD   50 mcg at 04/12/18 0827  . metoprolol tartrate (LOPRESSOR) tablet 25 mg  25 mg Oral Daily Lance Coon, MD   25 mg at 04/12/18 0902  . mometasone-formoterol (DULERA)  200-5 MCG/ACT inhaler 2 puff  2 puff Inhalation BID Lance Coon, MD   2 puff at 04/12/18 0827  . ondansetron (ZOFRAN) tablet 4 mg  4 mg Oral Q6H PRN Lance Coon, MD       Or  . ondansetron [facility]) injection 4 mg  4 mg Intravenous Q6H PRN Lance Coon, MD      . technetium albumin aggregated (MAA) injection solution 4 millicurie  4 millicurie Intravenous Once PRN [place], David A, MD         Discharge Medications: Please see discharge summary for a list of discharge medications.  Relevant Imaging Results:  Relevant Lab Results:   Additional Information SSN [id number]  Ross Ludwig, [place]

## 2018-04-13 LAB — BASIC METABOLIC PANEL
Anion gap: 13 (ref 5–15)
BUN: 65 mg/dL — AB (ref 6–20)
CALCIUM: 9.4 mg/dL (ref 8.9–10.3)
CO2: 27 mmol/L (ref 22–32)
CREATININE: 1.96 mg/dL — AB (ref 0.44–1.00)
Chloride: 96 mmol/L — ABNORMAL LOW (ref 101–111)
GFR calc non Af Amer: 21 mL/min — ABNORMAL LOW (ref >60)
GFR, EST AFRICAN AMERICAN: 25 mL/min — AB (ref >60)
Glucose, Bld: 157 mg/dL — ABNORMAL HIGH (ref 65–99)
Potassium: 4.2 mmol/L (ref 3.5–5.1)
Sodium: 136 mmol/L (ref 135–145)

## 2018-04-13 LAB — GLUCOSE, CAPILLARY
Glucose-Capillary: 140 mg/dL — ABNORMAL HIGH (ref 65–99)
Glucose-Capillary: 147 mg/dL — ABNORMAL HIGH (ref 65–99)
Glucose-Capillary: 115 mg/dL — ABNORMAL HIGH (ref 65–99)

## 2018-04-13 MED ORDER — FUROSEMIDE 20 MG PO TABS: 40.0000 mg | ORAL_TABLET | Freq: Two times a day (BID) | ORAL | Status: AC

## 2018-04-13 MED ORDER — FUROSEMIDE 40 MG PO TABS
40.0000 mg | ORAL_TABLET | Freq: Two times a day (BID) | ORAL | Status: DC
  Administered 2018-04-13: 40 mg via ORAL
  Filled 2018-04-13: qty 1

## 2018-04-13 NOTE — Discharge Summary (Addendum)
[place] at [facility], [place] y.o., DOB 1925-02-18, MRN [id number]. Admission date: 04/10/2018 Discharge Date 04/13/2018 Primary MD [name], MD Admitting Physician Lance Coon, MD  Admission Diagnosis  SOB (shortness of breath) [R06.02] Congestive heart failure, unspecified HF chronicity, unspecified heart failure type [facility]) [I50.9]  Discharge Diagnosis   Principal Problem:   Acute on chronic diastolic CHF Severe pulmonary hypertension   Controlled type 2 diabetes mellitus with complication, without long-term current use of insulin (HCC)   Chronic obstructive pulmonary disease (HCC)   HTN (hypertension)   Chronic lymphocytic leukemia ([place])   HLD (hyperlipidemia)   CKD (chronic kidney disease), stage IV [facility])         Hospital Course  Chelsea [name]  is a 84 y.o. female who presents with shortness of breath and lower extremity edema.  Patient states she typically has lower extremity edema, but is somewhat worse now.  Work-up in the ED does not show any edema or fluid in her lungs though her BNP is significantly elevated.  Patient was noted to have CHF and admitted for that.  She was treated with IV Lasix.  Patient underwent echocardiogram which showed preserved EF but severe pulmonary hypertension.  Patient underwent a VQ scan to make sure she did not have a pulmonary embolism.  Her severe pulmonary hypertension could be related to underlying untreated sleep apnea for her diagnosis of COPD.  Patient's advanced age precludes her from any aggressive intervention or aggressive therapy.  I recommend palliative care team to follow her at the facility.  Patient is agreeable for that.  Overall long-term prognosis is very poor.      Continues 3l oxygen all times         Consults  None  Significant Tests:  See full reports for all details    Nm Pulmonary Perf And Vent  Result Date: 04/12/2018 CLINICAL DATA:  Shortness of breath EXAM:  NUCLEAR MEDICINE VENTILATION - PERFUSION LUNG SCAN VIEWS: Anterior, posterior, left lateral, right lateral, RPO LPO, RAO, LAO-ventilation and perfusion RADIOPHARMACEUTICALS:  33.611 mCi of Tc-46m DTPA aerosol inhalation and 3.968 mCi Tc35m-MAA IV COMPARISON:  Chest radiograph April 12, 2018 FINDINGS: Ventilation: There are several subsegmental areas of photopenia on ventilation study. No segmental level ventilation defects are evident. Perfusion: There are subsegmental areas of photopenia which is centrally match areas of decreased ventilation. There are no segmental level areas of decreased uptake on the perfusion study, and there is no appreciable perfusion defect without corresponding ventilation defect. IMPRESSION: Scattered matching subsegmental ventilation perfusion defects. No segmental level perfusion defects are evident, and there is no significant ventilation/perfusion mismatch. This study constitutes an overall low probability of pulmonary embolism based on PIOPED II criteria. Electronically Signed   By: Lowella Grip III M.D.   On: 04/12/2018 15:21   Dg Chest Port 1 View  Result Date: 04/12/2018 CLINICAL DATA:  Acute shortness of breath. EXAM: PORTABLE CHEST 1 VIEW COMPARISON:  04/10/2018 and prior exam FINDINGS: The cardiomediastinal silhouette is unremarkable. Mild interstitial prominence again identified. Mild RIGHT basilar atelectasis and probable trace RIGHT pleural effusion again noted. No pneumothorax or new pulmonary opacities. IMPRESSION: Unchanged appearance of the chest with interstitial prominence, mild RIGHT basilar atelectasis and probable trace RIGHT pleural effusion. Electronically Signed   By: Margarette Canada M.D.   On: 04/12/2018 12:03   Dg Chest Port 1 View  Result Date: 04/10/2018 CLINICAL DATA:  Increasing shortness of breath EXAM: PORTABLE CHEST 1 VIEW COMPARISON:  05/11/2016 FINDINGS: Heart is normal size. Suspect small right pleural effusion with right base atelectasis. No  confluent opacity or effusion on the left. Areas of scarring in the upper lobes. No acute bony abnormality. IMPRESSION: Suspect small right pleural effusion with right base atelectasis. Electronically Signed   By: Rolm Baptise M.D.   On: 04/10/2018 19:59       Today   Subjective:   [name] patient doing much better he denies any complaints  Objective:   Blood pressure (!) 112/47, pulse 70, temperature (!) 97.5 F (36.4 C), resp. rate 20, height 5\' 2"  (1.575 m), weight 79.7 kg (175 lb 9.6 oz), SpO2 98 %.  .  Intake/Output Summary (Last 24 hours) at 04/13/2018 1128 Last data filed at 04/13/2018 1027 Gross per 24 hour  Intake 120 ml  Output 1050 ml  Net -930 ml    Exam VITAL SIGNS: Blood pressure (!) 112/47, pulse 70, temperature (!) 97.5 F (36.4 C), resp. rate 20, height 5\' 2"  (1.575 m), weight 79.7 kg (175 lb 9.6 oz), SpO2 98 %.  GENERAL:  84 y.o.-year-old patient lying in the bed with no acute distress.  EYES: Pupils equal, round, reactive to light and accommodation. No scleral icterus. Extraocular muscles intact.  HEENT: Head atraumatic, normocephalic. Oropharynx and nasopharynx clear.  NECK:  Supple, no jugular venous distention. No thyroid enlargement, no tenderness.  LUNGS: Normal breath sounds bilaterally, no wheezing, rales,rhonchi or crepitation. No use of accessory muscles of respiration.  CARDIOVASCULAR: S1, S2 normal. No murmurs, rubs, or gallops.  ABDOMEN: Soft, nontender, nondistended. Bowel sounds present. No organomegaly or mass.  EXTREMITIES: 1+ pedal edema, cyanosis, or clubbing.  NEUROLOGIC: Cranial nerves II through XII are intact. Muscle strength 5/5 in all extremities. Sensation intact. Gait not checked.  PSYCHIATRIC: The patient is alert and oriented x 3.  SKIN: No obvious rash, lesion, or ulcer.   Data Review     CBC w Diff:  Lab Results  Component Value Date   WBC 26.3 (H) 04/11/2018   HGB 13.0 04/11/2018   HGB 12.2 07/19/2014   HCT 39.4  04/11/2018   HCT 36.5 07/19/2014   PLT 197 04/11/2018   PLT 176 07/19/2014   LYMPHOPCT 60 04/10/2018   LYMPHOPCT 60.6 02/02/2014   MONOPCT 5 04/10/2018   MONOPCT 2 07/19/2014   MONOPCT 3.8 02/02/2014   EOSPCT 3 04/10/2018   EOSPCT 0.8 02/02/2014   BASOPCT 0 04/10/2018   BASOPCT 0.1 02/02/2014   CMP:  Lab Results  Component Value Date   NA 136 04/13/2018   NA 139 07/19/2014   K 4.2 04/13/2018   K 4.0 07/19/2014   CL 96 (L) 04/13/2018   CL 108 (H) 07/19/2014   CO2 27 04/13/2018   CO2 20 (L) 07/19/2014   BUN 65 (H) 04/13/2018   BUN 36 (H) 07/19/2014   CREATININE 1.96 (H) 04/13/2018   CREATININE 1.73 (H) 07/19/2014   PROT 7.2 02/23/2018   PROT 6.9 07/19/2014   ALBUMIN 4.0 02/23/2018   ALBUMIN 3.7 07/19/2014   BILITOT 1.2 02/23/2018   BILITOT 0.5 07/19/2014   ALKPHOS 71 02/23/2018   ALKPHOS 74 07/19/2014   AST 24 02/23/2018   AST 16 07/19/2014   ALT 17 02/23/2018   ALT 18 07/19/2014  .  Micro Results No results found for this or any previous visit (from the past 240 hour(s)).      Code Status Orders  (From admission, onward)        Start  Ordered   04/11/18 0041  Do not attempt resuscitation (DNR)  Continuous    Question Answer Comment  In the event of cardiac or respiratory ARREST Do not call a "code blue"   In the event of cardiac or respiratory ARREST Do not perform Intubation, CPR, defibrillation or ACLS   In the event of cardiac or respiratory ARREST Use medication by any route, position, wound care, and other measures to relive pain and suffering. May use oxygen, suction and manual treatment of airway obstruction as needed for comfort.      04/11/18 0040    Code Status History    Date Active Date Inactive Code Status Order ID Comments User Context   05/11/2016 1727 05/15/2016 1354 DNR [id number]  Baxter Hire, MD Inpatient   03/01/2013 2204 04/06/2013 1652 Full Code [id number]  Codjie, Delphine Inpatient    Advance Directive Documentation     Most  Recent Value  Type of Advance Directive  Living will, Healthcare Power of Attorney  Pre-existing out of facility DNR order (yellow form or pink MOST form)  -  "MOST" Form in Place?  -          Follow-up Information    [facility] Follow up on 04/18/2018.   Specialty:  Cardiology Why:  at 11:20am Contact information: [place] [address] [place] [phone]       [name], MD Follow up in 6 day(s).   Specialty:  Family Medicine Contact information: [address] [place] [address] [phone]           Discharge Medications   Allergies as of 04/13/2018      Reactions   Oxycodone-acetaminophen Other (See Comments)   SEVERE HYPOTENSION   Allopurinol    Aurothioglucose Other (See Comments)   Darvocet [propoxyphene N-acetaminophen]    Gold-containing Drug Products Other (See Comments)   Penicillins    Prednisone       Medication List    TAKE these medications   ADVAIR DISKUS 250-50 MCG/DOSE Aepb Generic drug:  Fluticasone-Salmeterol Inhale 1 puff into the lungs 2 (two) times daily.   aspirin EC 81 MG tablet Take 81 mg by mouth daily.   atorvastatin 20 MG tablet Commonly known as:  LIPITOR Take 20 mg by mouth daily.   furosemide 20 MG tablet Commonly known as:  LASIX Take 2 tablets (40 mg total) by mouth 2 (two) times daily. What changed:  when to take this   glimepiride 2 MG tablet Commonly known as:  AMARYL Take 1 tablet by mouth daily.   JANUVIA 25 MG tablet Generic drug:  sitaGLIPtin Take 25 mg by mouth daily.   levothyroxine 50 MCG tablet Commonly known as:  SYNTHROID, LEVOTHROID Take 50 mcg by mouth daily before breakfast.   losartan 50 MG tablet Commonly known as:  COZAAR Take 1 tablet by mouth daily.   metoprolol tartrate 25 MG tablet Commonly known as:  LOPRESSOR Take 1 tablet (25 mg total) by mouth 2 (two) times daily. What changed:  when to  take this   OXYGEN Inhale 2 L/min into the lungs continuous.   potassium chloride SA 20 MEQ tablet Commonly known as:  K-DUR,KLOR-CON Take 20 mEq by mouth daily.   PROAIR HFA 108 (90 Base) MCG/ACT inhaler Generic drug:  albuterol Inhale 2 puffs into the lungs every 6 (six) hours as needed. For shortness of breath/wheezing.   VITAMIN D-1000 MAX ST 1000 units tablet  Generic drug:  Cholecalciferol Take by mouth.            Durable Medical Equipment  (From admission, onward)        Start     Ordered   04/13/18 1121  DME Oxygen  Once    Question Answer Comment  Mode or (Route) Nasal cannula   Liters per Minute 3   Frequency Continuous (stationary and portable oxygen unit needed)   Oxygen delivery system Gas      04/13/18 1121         Total Time in preparing paper work, data evaluation and todays exam - 94 minutes  Dustin Flock M.D on 04/13/2018 at 11:28 AM [place]  [phone]

## 2018-04-13 NOTE — Care Management Important Message (Signed)
Copy of signed IM left with patient in room.  

## 2018-04-13 NOTE — Plan of Care (Signed)
  Problem: Clinical Measurements: Goal: Respiratory complications will improve Outcome: Adequate for Discharge   Problem: Safety: Goal: Ability to remain free from injury will improve Outcome: Adequate for Discharge   Problem: Activity: Goal: Capacity to carry out activities will improve Outcome: Adequate for Discharge   Problem: Cardiac: Goal: Ability to achieve and maintain adequate cardiopulmonary perfusion will improve Outcome: Adequate for Discharge

## 2018-04-13 NOTE — Progress Notes (Signed)
Pt discharged to [facility] via EMS. VSS.

## 2018-04-13 NOTE — Progress Notes (Signed)
EMS called and report given to Mariann Laster at [facility], awaiting transport.

## 2018-04-13 NOTE — Clinical Social Work Note (Signed)
Patient to be d/c'ed today to [facility] room 216.  Patient and family agreeable to plans will transport via ems RN to call report [phone].  Patient will update her family that she will be discharging today.  Evette Cristal, MSW, [place]

## 2018-04-13 NOTE — Plan of Care (Signed)
?  Problem: Elimination: ?Goal: Will not experience complications related to urinary retention ?Outcome: Progressing ?  ?

## 2018-04-14 DIAGNOSIS — J9611 Chronic respiratory failure with hypoxia: Secondary | ICD-10-CM | POA: Diagnosis not present

## 2018-04-14 DIAGNOSIS — J439 Emphysema, unspecified: Secondary | ICD-10-CM | POA: Diagnosis not present

## 2018-04-14 DIAGNOSIS — N184 Chronic kidney disease, stage 4 (severe): Secondary | ICD-10-CM | POA: Diagnosis not present

## 2018-04-14 DIAGNOSIS — I5033 Acute on chronic diastolic (congestive) heart failure: Secondary | ICD-10-CM | POA: Diagnosis not present

## 2018-04-14 DIAGNOSIS — E1121 Type 2 diabetes mellitus with diabetic nephropathy: Secondary | ICD-10-CM | POA: Diagnosis not present

## 2018-04-14 DIAGNOSIS — C9110 Chronic lymphocytic leukemia of B-cell type not having achieved remission: Secondary | ICD-10-CM | POA: Diagnosis not present

## 2018-04-18 ENCOUNTER — Other Ambulatory Visit: Payer: Self-pay | Admitting: Family

## 2018-04-18 ENCOUNTER — Encounter: Payer: Self-pay | Admitting: Family

## 2018-04-18 ENCOUNTER — Ambulatory Visit: Payer: No Typology Code available for payment source | Admitting: Family

## 2018-04-18 ENCOUNTER — Ambulatory Visit
Admission: RE | Admit: 2018-04-18 | Discharge: 2018-04-18 | Disposition: A | Discharge status: Home / Self Care | Payer: No Typology Code available for payment source | Source: Ambulatory Visit | Attending: Family | Admitting: Family

## 2018-04-18 VITALS — BP 112/47 | HR 61 | Resp 18 | Ht 62.0 in | Wt 180.0 lb

## 2018-04-18 DIAGNOSIS — I5033 Acute on chronic diastolic (congestive) heart failure: Secondary | ICD-10-CM | POA: Diagnosis not present

## 2018-04-18 DIAGNOSIS — C9110 Chronic lymphocytic leukemia of B-cell type not having achieved remission: Secondary | ICD-10-CM | POA: Diagnosis not present

## 2018-04-18 DIAGNOSIS — Z7982 Long term (current) use of aspirin: Secondary | ICD-10-CM | POA: Insufficient documentation

## 2018-04-18 DIAGNOSIS — J449 Chronic obstructive pulmonary disease, unspecified: Secondary | ICD-10-CM | POA: Diagnosis not present

## 2018-04-18 DIAGNOSIS — Z7984 Long term (current) use of oral hypoglycemic drugs: Secondary | ICD-10-CM | POA: Diagnosis not present

## 2018-04-18 DIAGNOSIS — I130 Hypertensive heart and chronic kidney disease with heart failure and stage 1 through stage 4 chronic kidney disease, or unspecified chronic kidney disease: Secondary | ICD-10-CM | POA: Diagnosis present

## 2018-04-18 DIAGNOSIS — Z7951 Long term (current) use of inhaled steroids: Secondary | ICD-10-CM | POA: Diagnosis not present

## 2018-04-18 DIAGNOSIS — H409 Unspecified glaucoma: Secondary | ICD-10-CM | POA: Insufficient documentation

## 2018-04-18 DIAGNOSIS — Z79890 Hormone replacement therapy (postmenopausal): Secondary | ICD-10-CM | POA: Insufficient documentation

## 2018-04-18 DIAGNOSIS — Z87891 Personal history of nicotine dependence: Secondary | ICD-10-CM | POA: Diagnosis not present

## 2018-04-18 DIAGNOSIS — I272 Pulmonary hypertension, unspecified: Secondary | ICD-10-CM

## 2018-04-18 DIAGNOSIS — E785 Hyperlipidemia, unspecified: Secondary | ICD-10-CM | POA: Insufficient documentation

## 2018-04-18 DIAGNOSIS — E1122 Type 2 diabetes mellitus with diabetic chronic kidney disease: Secondary | ICD-10-CM | POA: Diagnosis not present

## 2018-04-18 DIAGNOSIS — N189 Chronic kidney disease, unspecified: Secondary | ICD-10-CM | POA: Diagnosis not present

## 2018-04-18 DIAGNOSIS — I890 Lymphedema, not elsewhere classified: Secondary | ICD-10-CM | POA: Insufficient documentation

## 2018-04-18 DIAGNOSIS — I5031 Acute diastolic (congestive) heart failure: Secondary | ICD-10-CM

## 2018-04-18 DIAGNOSIS — Z79899 Other long term (current) drug therapy: Secondary | ICD-10-CM | POA: Diagnosis not present

## 2018-04-18 DIAGNOSIS — I252 Old myocardial infarction: Secondary | ICD-10-CM | POA: Diagnosis not present

## 2018-04-18 DIAGNOSIS — I10 Essential (primary) hypertension: Secondary | ICD-10-CM

## 2018-04-18 DIAGNOSIS — Z8249 Family history of ischemic heart disease and other diseases of the circulatory system: Secondary | ICD-10-CM | POA: Diagnosis not present

## 2018-04-18 LAB — BASIC METABOLIC PANEL
ANION GAP: 14 (ref 5–15)
BUN: 111 mg/dL — ABNORMAL HIGH (ref 6–20)
CALCIUM: 9.4 mg/dL (ref 8.9–10.3)
CHLORIDE: 93 mmol/L — AB (ref 101–111)
CO2: 23 mmol/L (ref 22–32)
Creatinine, Ser: 2.86 mg/dL — ABNORMAL HIGH (ref 0.44–1.00)
GFR calc Af Amer: 16 mL/min — ABNORMAL LOW (ref >60)
GFR calc non Af Amer: 13 mL/min — ABNORMAL LOW (ref >60)
GLUCOSE: 228 mg/dL — AB (ref 65–99)
Potassium: 5 mmol/L (ref 3.5–5.1)
Sodium: 130 mmol/L — ABNORMAL LOW (ref 135–145)

## 2018-04-18 LAB — BRAIN NATRIURETIC PEPTIDE: B Natriuretic Peptide: 198 pg/mL — ABNORMAL HIGH (ref 0.0–100.0)

## 2018-04-18 MED ORDER — FUROSEMIDE 10 MG/ML IJ SOLN
80.0000 mg | Freq: Once | INTRAMUSCULAR | Status: AC
  Administered 2018-04-18: 80 mg via INTRAVENOUS

## 2018-04-18 MED ORDER — POTASSIUM CHLORIDE CRYS ER 20 MEQ PO TBCR
40.0000 meq | EXTENDED_RELEASE_TABLET | Freq: Once | ORAL | Status: AC
  Administered 2018-04-18: 40 meq via ORAL

## 2018-04-18 MED ORDER — SODIUM CHLORIDE FLUSH 0.9 % IV SOLN
INTRAVENOUS | Status: AC
  Filled 2018-04-18: qty 20

## 2018-04-18 NOTE — Progress Notes (Signed)
Patient ID: Chelsea [name], female    DOB: [birth year], 84 y.o.   MRN: [id number]  HPI  Chelsea [name] is a 84 y/o female with a history of CKD, CLL, COPD, DM, hyperlipidemia, HTN, lymphedema, NSTEMI, remote tobacco use and chronic heart failure.  Echo report from 04/11/18 reviewed and showed an EF of 55-65% along with mild MR and an elevated PA pressure of 82 mm Hg. Reviewed echo report from 05/12/16 which showed an EF of 55-60% along with mild MR and moderate TR.   Admitted 04/10/18 due to HF exacerbation. Cardiology was consulted. VQ scan was negative for PE. Severe pulmonary HTN on echo. Palliative care to follow at the facility. Discharged to [facility] after 3 days.   She presents today for a follow-up visit with a chief complaint of moderate shortness of breath upon minimal exertion. She says that it's taking her longer to recover when she gets short of breath She has associated fatigue, pedal edema and difficulty sleeping along with this. She denies any abdominal distention, palpitations, chest pain or weight gain.   Past Medical History:  Diagnosis Date  . CHF (congestive heart failure) ([place])   . Chronic kidney disease   . CLL (chronic lymphocytic leukemia) ([place])   . COPD (chronic obstructive pulmonary disease) ([place])   . Diabetes mellitus ([place])   . Glaucoma   . Hyperlipidemia   . Hypertension   . Lymphedema   . Lymphocytic leukemia ([place])   . NSTEMI    Past Surgical History:  Procedure Laterality Date  . ABDOMINAL HYSTERECTOMY    . APPENDECTOMY    . CHOLECYSTECTOMY    . ESOPHAGOGASTRODUODENOSCOPY N/A 03/30/2013   Procedure: ESOPHAGOGASTRODUODENOSCOPY (EGD);  Surgeon: Lafayette Dragon, MD;  Location: [facility] ENDOSCOPY;  Service: Endoscopy;  Laterality: N/A;   Family History  Problem Relation Age of Onset  . Heart attack Mother   . Diabetes Mother   . Hypertension Father    Social History   Tobacco Use  . Smoking status: Former Smoker    Years: 30.00    Last attempt to quit: 11/08/1970    Years since quitting: 47.4  . Smokeless tobacco: Never Used  Substance Use Topics  . Alcohol use: No   Allergies  Allergen Reactions  . Oxycodone-Acetaminophen Other (See Comments)    SEVERE HYPOTENSION  . Allopurinol   . Aurothioglucose Other (See Comments)  . Darvocet [Propoxyphene N-Acetaminophen]   . Gold-Containing Drug Products Other (See Comments)  . Penicillins   . Prednisone    Prior to Admission medications   Medication Sig Start Date End Date Taking? Authorizing Provider  ADVAIR DISKUS 250-50 MCG/DOSE AEPB Inhale 1 puff into the lungs 2 (two) times daily. 04/08/16  Yes [provider]  aspirin EC 81 MG tablet Take 81 mg by mouth daily.   Yes [provider]  atorvastatin (LIPITOR) 20 MG tablet Take 20 mg by mouth daily.   Yes [provider]  Cholecalciferol (VITAMIN D-1000 MAX ST) 1000 units tablet Take by mouth.   Yes [provider]  furosemide (LASIX) 20 MG tablet Take 2 tablets (40 mg total) by mouth 2 (two) times daily. 04/13/18  Yes Dustin Flock, MD  glimepiride (AMARYL) 2 MG tablet Take 1 tablet by mouth daily. 02/22/17  Yes [provider]  JANUVIA 25 MG tablet Take 25 mg by mouth daily. 04/20/16  Yes [provider]  levothyroxine (SYNTHROID, LEVOTHROID) 50 MCG tablet Take 50 mcg by mouth daily before breakfast.  08/30/17  Yes [provider]  losartan (COZAAR) 50 MG tablet Take 1 tablet by mouth daily.   Yes [provider]  metoprolol tartrate (LOPRESSOR) 25 MG tablet Take 1 tablet (25 mg total) by mouth 2 (two) times daily. 05/14/16  Yes Demetrios Loll, MD  OXYGEN Inhale 2 L/min into the lungs continuous.   Yes [provider]  potassium chloride SA (K-DUR,KLOR-CON) 20 MEQ tablet Take 20 mEq by mouth daily.   Yes [provider]  PROAIR HFA 108 (90 Base) MCG/ACT inhaler Inhale 2 puffs into the lungs every 6 (six) hours as needed. For shortness of breath/wheezing. 04/08/16  Yes  [provider]    Review of Systems  Constitutional: Positive for fatigue. Negative for appetite change.  HENT: Negative for congestion, postnasal drip and sore throat.   Eyes: Negative.   Respiratory: Positive for shortness of breath. Negative for chest tightness.   Cardiovascular: Positive for leg swelling (lymphedema). Negative for chest pain and palpitations.  Gastrointestinal: Negative for abdominal distention and abdominal pain.  Endocrine: Negative.   Genitourinary: Negative.   Musculoskeletal: Negative for back pain and neck pain.  Skin: Negative.   Allergic/Immunologic: Negative.   Neurological: Negative for dizziness and light-headedness.  Hematological: Negative for adenopathy. Does not bruise/bleed easily.  Psychiatric/Behavioral: Positive for sleep disturbance (sleeping in recliner due to comfort and frequency of waking up). Negative for dysphoric mood. The patient is not nervous/anxious.    Vitals:   04/18/18 1131  BP: (!) 112/47  Pulse: 61  Resp: 18  SpO2: 95%  Weight: 180 lb (81.6 kg)  Height: 5\' 2"  (1.575 m)   Wt Readings from Last 3 Encounters:  04/18/18 180 lb (81.6 kg)  04/13/18 175 lb 9.6 oz (79.7 kg)  02/23/18 180 lb (81.6 kg)   Lab Results  Component Value Date   CREATININE 1.96 (H) 04/13/2018   CREATININE 1.83 (H) 04/12/2018   CREATININE 1.84 (H) 04/11/2018    Physical Exam  Constitutional: She is oriented to person, place, and time. She appears well-developed and well-nourished.  HENT:  Head: Normocephalic and atraumatic.  Neck: Normal range of motion. Neck supple. No JVD present.  Cardiovascular: Normal rate and regular rhythm.  Pulmonary/Chest: Effort normal. She has no wheezes. She has rhonchi in the right upper field and the left upper field. She has no rales.  Abdominal: Soft. She exhibits no distension. There is no tenderness.  Musculoskeletal: She exhibits edema (3+ pitting edema in bilateral lower legs ). She exhibits no  tenderness.  Neurological: She is alert and oriented to person, place, and time.  Skin: Skin is warm and dry.  Psychiatric: She has a normal mood and affect. Her behavior is normal. Thought content normal.  Nursing note and vitals reviewed.   Assessment & Plan:  1: Acute on Chronic heart failure with preserved ejection fraction- - NYHA class III - moderately fluid overloaded today - weighing daily and says that her weight has been stable. Reminded to call for an overnight weight gain of >2 pounds or a weekly weight gain of >5 pounds - weight stable from last time she was here  - not adding salt to her food  - saw cardiologist (Paraschos) 01/19/18 - chronic lymphedema in both lower legs; tries to elevate as much as possible - does walk with her walker - BNP 04/10/18 was 1119.0 - 80mg  IV lasix given today along with 11meq potassium.  - BMP/BNP drawn today  2: HTN: - BP looks good today -  BMP on 04/13/18 reviewed and shows sodium 136, potassium 4.2 and GFR 21 - saw PCP ([place]stein) 02/20/18  3: Pulmonary HTN- - has seen a pulmonologist in the past but doesn't remember who - wears oxygen @ 3L around the clock - patient and caregiver from [facility] with her express concern about how difficult it's becoming for patient to get to MD appointments due to her severe shortness of breath - discussed palliative care and explained that they help establish goals of care. Patient says that she's lived a great life and thinks palliative care would be a very good option for her.   Facility medication list was reviewed.   Due to difficulty with breathing related to transportation issues, will not make a return appointment at this time pending palliative care consult.

## 2018-04-18 NOTE — Progress Notes (Signed)
Lasix adm. Via 24  gauge iv left hand , difficult iv stick multiple attempts pt. Tolerated well .potasium ad. PO.

## 2018-04-18 NOTE — Patient Instructions (Addendum)
Continue weighing daily and call for an overnight weight gain of > 2 pounds or a weekly weight gain of >5 pounds.  Palliative care consult

## 2018-04-18 NOTE — Progress Notes (Signed)
Labs drawn per MD order . Pt. Difficult IV stick.

## 2018-05-17 DIAGNOSIS — I130 Hypertensive heart and chronic kidney disease with heart failure and stage 1 through stage 4 chronic kidney disease, or unspecified chronic kidney disease: Secondary | ICD-10-CM | POA: Diagnosis not present

## 2018-05-17 DIAGNOSIS — I5020 Unspecified systolic (congestive) heart failure: Secondary | ICD-10-CM | POA: Diagnosis not present

## 2018-05-17 DIAGNOSIS — E1122 Type 2 diabetes mellitus with diabetic chronic kidney disease: Secondary | ICD-10-CM | POA: Diagnosis not present

## 2018-05-17 DIAGNOSIS — N184 Chronic kidney disease, stage 4 (severe): Secondary | ICD-10-CM | POA: Diagnosis not present

## 2018-05-23 DIAGNOSIS — E1121 Type 2 diabetes mellitus with diabetic nephropathy: Secondary | ICD-10-CM

## 2018-05-23 DIAGNOSIS — I5032 Chronic diastolic (congestive) heart failure: Secondary | ICD-10-CM

## 2018-05-23 DIAGNOSIS — N184 Chronic kidney disease, stage 4 (severe): Secondary | ICD-10-CM | POA: Diagnosis not present

## 2018-05-23 DIAGNOSIS — C9110 Chronic lymphocytic leukemia of B-cell type not having achieved remission: Secondary | ICD-10-CM

## 2018-05-23 DIAGNOSIS — J439 Emphysema, unspecified: Secondary | ICD-10-CM | POA: Diagnosis not present

## 2018-06-20 ENCOUNTER — Ambulatory Visit: Payer: Medicare Other | Admitting: Family

## 2018-06-30 DIAGNOSIS — E1140 Type 2 diabetes mellitus with diabetic neuropathy, unspecified: Secondary | ICD-10-CM

## 2018-06-30 DIAGNOSIS — N183 Chronic kidney disease, stage 3 (moderate): Secondary | ICD-10-CM

## 2018-06-30 DIAGNOSIS — I270 Primary pulmonary hypertension: Secondary | ICD-10-CM

## 2018-06-30 DIAGNOSIS — J9611 Chronic respiratory failure with hypoxia: Secondary | ICD-10-CM

## 2018-06-30 DIAGNOSIS — C9111 Chronic lymphocytic leukemia of B-cell type in remission: Secondary | ICD-10-CM

## 2018-06-30 DIAGNOSIS — I5030 Unspecified diastolic (congestive) heart failure: Secondary | ICD-10-CM | POA: Diagnosis not present

## 2018-06-30 DIAGNOSIS — J449 Chronic obstructive pulmonary disease, unspecified: Secondary | ICD-10-CM | POA: Diagnosis not present

## 2018-07-28 DIAGNOSIS — C9110 Chronic lymphocytic leukemia of B-cell type not having achieved remission: Secondary | ICD-10-CM

## 2018-07-28 DIAGNOSIS — I270 Primary pulmonary hypertension: Secondary | ICD-10-CM

## 2018-07-28 DIAGNOSIS — J449 Chronic obstructive pulmonary disease, unspecified: Secondary | ICD-10-CM

## 2018-07-28 DIAGNOSIS — I5030 Unspecified diastolic (congestive) heart failure: Secondary | ICD-10-CM

## 2018-07-28 DIAGNOSIS — N183 Chronic kidney disease, stage 3 (moderate): Secondary | ICD-10-CM | POA: Diagnosis not present

## 2018-07-28 DIAGNOSIS — E039 Hypothyroidism, unspecified: Secondary | ICD-10-CM | POA: Diagnosis not present

## 2018-07-28 DIAGNOSIS — E1140 Type 2 diabetes mellitus with diabetic neuropathy, unspecified: Secondary | ICD-10-CM | POA: Diagnosis not present

## 2018-07-28 DIAGNOSIS — J9610 Chronic respiratory failure, unspecified whether with hypoxia or hypercapnia: Secondary | ICD-10-CM

## 2018-09-26 DIAGNOSIS — J9611 Chronic respiratory failure with hypoxia: Secondary | ICD-10-CM

## 2018-09-26 DIAGNOSIS — E1121 Type 2 diabetes mellitus with diabetic nephropathy: Secondary | ICD-10-CM

## 2018-09-26 DIAGNOSIS — I2722 Pulmonary hypertension due to left heart disease: Secondary | ICD-10-CM

## 2018-09-26 DIAGNOSIS — J439 Emphysema, unspecified: Secondary | ICD-10-CM

## 2018-09-26 DIAGNOSIS — C9110 Chronic lymphocytic leukemia of B-cell type not having achieved remission: Secondary | ICD-10-CM

## 2018-09-26 DIAGNOSIS — I5032 Chronic diastolic (congestive) heart failure: Secondary | ICD-10-CM | POA: Diagnosis not present

## 2018-09-26 DIAGNOSIS — N184 Chronic kidney disease, stage 4 (severe): Secondary | ICD-10-CM

## 2018-11-08 DEATH — deceased

## 2019-01-04 ENCOUNTER — Telehealth: Payer: Self-pay | Admitting: *Deleted

## 2019-01-04 NOTE — Telephone Encounter (Signed)
Daughter called to report that patient expired in December and her mother had asked her to let us know when she passed

## 2019-02-23 ENCOUNTER — Other Ambulatory Visit: Payer: Medicare Other

## 2019-02-23 ENCOUNTER — Ambulatory Visit: Payer: Medicare Other | Admitting: Internal Medicine
